# Patient Record
Sex: Female | Born: 1945
Health system: Southern US, Community
[De-identification: ages and names within clinical notes are randomized; demographics above are authoritative.]

## PROBLEM LIST (undated history)

## (undated) DIAGNOSIS — I7774 Dissection of vertebral artery: Secondary | ICD-10-CM

## (undated) DIAGNOSIS — N809 Endometriosis, unspecified: Secondary | ICD-10-CM

## (undated) DIAGNOSIS — S82891A Other fracture of right lower leg, initial encounter for closed fracture: Secondary | ICD-10-CM

## (undated) DIAGNOSIS — E782 Mixed hyperlipidemia: Secondary | ICD-10-CM

## (undated) DIAGNOSIS — I10 Essential (primary) hypertension: Secondary | ICD-10-CM

## (undated) DIAGNOSIS — Z8719 Personal history of other diseases of the digestive system: Secondary | ICD-10-CM

## (undated) DIAGNOSIS — M1990 Unspecified osteoarthritis, unspecified site: Secondary | ICD-10-CM

## (undated) DIAGNOSIS — Z8489 Family history of other specified conditions: Secondary | ICD-10-CM

## (undated) DIAGNOSIS — S93431S Sprain of tibiofibular ligament of right ankle, sequela: Secondary | ICD-10-CM

## (undated) DIAGNOSIS — R519 Headache, unspecified: Secondary | ICD-10-CM

## (undated) DIAGNOSIS — M545 Low back pain, unspecified: Secondary | ICD-10-CM

## (undated) DIAGNOSIS — R51 Headache: Secondary | ICD-10-CM

## (undated) DIAGNOSIS — I119 Hypertensive heart disease without heart failure: Secondary | ICD-10-CM

## (undated) DIAGNOSIS — G8929 Other chronic pain: Secondary | ICD-10-CM

## (undated) DIAGNOSIS — M47812 Spondylosis without myelopathy or radiculopathy, cervical region: Secondary | ICD-10-CM

## (undated) DIAGNOSIS — J45998 Other asthma: Secondary | ICD-10-CM

## (undated) DIAGNOSIS — R55 Syncope and collapse: Secondary | ICD-10-CM

## (undated) DIAGNOSIS — I951 Orthostatic hypotension: Secondary | ICD-10-CM

## (undated) DIAGNOSIS — N183 Chronic kidney disease, stage 3 unspecified: Secondary | ICD-10-CM

## (undated) DIAGNOSIS — I454 Nonspecific intraventricular block: Secondary | ICD-10-CM

## (undated) DIAGNOSIS — E119 Type 2 diabetes mellitus without complications: Secondary | ICD-10-CM

## (undated) DIAGNOSIS — I639 Cerebral infarction, unspecified: Secondary | ICD-10-CM

## (undated) DIAGNOSIS — I670 Dissection of cerebral arteries, nonruptured: Secondary | ICD-10-CM

## (undated) DIAGNOSIS — B029 Zoster without complications: Secondary | ICD-10-CM

## (undated) DIAGNOSIS — K219 Gastro-esophageal reflux disease without esophagitis: Secondary | ICD-10-CM

## (undated) DIAGNOSIS — M797 Fibromyalgia: Secondary | ICD-10-CM

## (undated) DIAGNOSIS — K259 Gastric ulcer, unspecified as acute or chronic, without hemorrhage or perforation: Secondary | ICD-10-CM

## (undated) DIAGNOSIS — E1142 Type 2 diabetes mellitus with diabetic polyneuropathy: Secondary | ICD-10-CM

## (undated) DIAGNOSIS — IMO0001 Reserved for inherently not codable concepts without codable children: Secondary | ICD-10-CM

## (undated) DIAGNOSIS — M722 Plantar fascial fibromatosis: Secondary | ICD-10-CM

## (undated) DIAGNOSIS — E669 Obesity, unspecified: Secondary | ICD-10-CM

## (undated) DIAGNOSIS — E7800 Pure hypercholesterolemia, unspecified: Secondary | ICD-10-CM

## (undated) DIAGNOSIS — N189 Chronic kidney disease, unspecified: Secondary | ICD-10-CM

## (undated) HISTORY — DX: Fibromyalgia: M79.7

## (undated) HISTORY — DX: Morbid (severe) obesity due to excess calories: E66.01

## (undated) HISTORY — PX: FRACTURE SURGERY: SHX138

## (undated) HISTORY — DX: Plantar fascial fibromatosis: M72.2

## (undated) HISTORY — PX: CLOSED REDUCTION NASAL FRACTURE: SHX5365

## (undated) HISTORY — PX: APPENDECTOMY: SHX54

## (undated) HISTORY — DX: Zoster without complications: B02.9

## (undated) HISTORY — DX: Type 2 diabetes mellitus with diabetic polyneuropathy: E11.42

## (undated) HISTORY — PX: TONSILLECTOMY: SUR1361

## (undated) HISTORY — DX: Headache: R51

## (undated) HISTORY — DX: Chronic kidney disease, unspecified: N18.9

## (undated) HISTORY — PX: LAPAROSCOPIC CHOLECYSTECTOMY: SUR755

## (undated) HISTORY — DX: Syncope and collapse: R55

## (undated) HISTORY — PX: JOINT REPLACEMENT: SHX530

## (undated) HISTORY — PX: ANAL FISSURE REPAIR: SHX2312

## (undated) HISTORY — PX: HAND SURGERY: SHX662

## (undated) HISTORY — DX: Dissection of vertebral artery: I77.74

## (undated) HISTORY — DX: Sprain of tibiofibular ligament of right ankle, sequela: S93.431S

## (undated) HISTORY — DX: Obesity, unspecified: E66.9

## (undated) HISTORY — DX: Reserved for inherently not codable concepts without codable children: IMO0001

## (undated) HISTORY — DX: Headache, unspecified: R51.9

## (undated) HISTORY — DX: Endometriosis, unspecified: N80.9

## (undated) HISTORY — DX: Mixed hyperlipidemia: E78.2

## (undated) HISTORY — DX: Essential (primary) hypertension: I10

## (undated) HISTORY — DX: Spondylosis without myelopathy or radiculopathy, cervical region: M47.812

## (undated) HISTORY — PX: CATARACT EXTRACTION W/ INTRAOCULAR LENS  IMPLANT, BILATERAL: SHX1307

## (undated) HISTORY — DX: Orthostatic hypotension: I95.1

## (undated) HISTORY — DX: Hypertensive heart disease without heart failure: I11.9

## (undated) HISTORY — DX: Other fracture of right lower leg, initial encounter for closed fracture: S82.891A

## (undated) HISTORY — DX: Nonspecific intraventricular block: I45.4

## (undated) HISTORY — DX: Unspecified osteoarthritis, unspecified site: M19.90

## (undated) HISTORY — PX: TOTAL KNEE ARTHROPLASTY: SHX125

## (undated) HISTORY — DX: Low back pain: M54.5

## (undated) HISTORY — DX: Other chronic pain: G89.29

## (undated) HISTORY — DX: Gastro-esophageal reflux disease without esophagitis: K21.9

---
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
## 1976-12-23 HISTORY — PX: TUBAL LIGATION: SHX77

## 1976-12-23 HISTORY — PX: SALPINGOOPHORECTOMY: SHX82

## 1977-12-23 HISTORY — PX: VAGINAL HYSTERECTOMY: SUR661

## 1992-12-23 HISTORY — PX: CHOLECYSTECTOMY: SHX55

## 1995-12-24 DIAGNOSIS — I670 Dissection of cerebral arteries, nonruptured: Secondary | ICD-10-CM | POA: Insufficient documentation

## 1995-12-24 HISTORY — DX: Dissection of cerebral arteries, nonruptured: I67.0

## 1999-06-01 ENCOUNTER — Encounter: Payer: Self-pay | Admitting: Anesthesiology

## 1999-06-01 ENCOUNTER — Encounter: Admission: RE | Admit: 1999-06-01 | Discharge: 1999-08-30 | Payer: Self-pay | Admitting: Anesthesiology

## 2001-02-20 ENCOUNTER — Encounter: Payer: Self-pay | Admitting: Pediatrics

## 2001-02-20 ENCOUNTER — Inpatient Hospital Stay (HOSPITAL_COMMUNITY): Admission: EM | Admit: 2001-02-20 | Discharge: 2001-02-22 | Payer: Self-pay | Admitting: Emergency Medicine

## 2001-02-21 ENCOUNTER — Encounter: Payer: Self-pay | Admitting: Pediatrics

## 2001-06-29 ENCOUNTER — Inpatient Hospital Stay (HOSPITAL_COMMUNITY): Admission: RE | Admit: 2001-06-29 | Discharge: 2001-07-03 | Payer: Self-pay | Admitting: Orthopedic Surgery

## 2006-06-16 ENCOUNTER — Ambulatory Visit (HOSPITAL_BASED_OUTPATIENT_CLINIC_OR_DEPARTMENT_OTHER): Admission: RE | Admit: 2006-06-16 | Discharge: 2006-06-16 | Payer: Self-pay | Admitting: Orthopedic Surgery

## 2007-06-10 ENCOUNTER — Inpatient Hospital Stay (HOSPITAL_COMMUNITY): Admission: RE | Admit: 2007-06-10 | Discharge: 2007-06-15 | Payer: Self-pay | Admitting: Orthopedic Surgery

## 2011-05-10 NOTE — Op Note (Signed)
NAMECHERE, BABSON               ACCOUNT NO.:  [id number]   MEDICAL RECORD NO.:  [id number]          PATIENT TYPE:  INP   LOCATION:  5009                         FACILITY:  [facility]   PHYSICIAN:  Feliberto Gottron. Turner Daniels, M.D.   DATE OF BIRTH:  December 23, 1946   DATE OF PROCEDURE:  06/17/2007  DATE OF DISCHARGE:  06/15/2007                               OPERATIVE REPORT   PREOPERATIVE DIAGNOSIS:  End-stage arthritis right knee.   POSTOPERATIVE DIAGNOSIS:  End-stage arthritis right knee.   PROCEDURE:  Right total knee arthroplasty using Osteonics Scorpio  components, 7 femur, 7 tibia, 5 modified medial patellar button and a 10  mm spacer.  All components cemented with a double batch of Howmedica  cement was 1500 mg Zinacef mixed in cement.   SURGEON:  Feliberto Gottron. Turner Daniels, M.D.   ASSISTANT:  Skip Mayer PA-C.   ANESTHETIC:  Endotracheal.   ESTIMATED BLOOD LOSS:  Minimal.   FLUID REPLACEMENT:  1500 mL crystalloid.   DRAINS:  Foley catheter and two medium hemostats.   TOURNIQUET TIME:  1 hour and 30 minutes.   INDICATIONS FOR PROCEDURE:  Chloe [name] underwent left total knee  arthroplasty by me about four or five years ago, has done very well with  the Osteonics Scorpio knee on the left side and now desires right total  knee arthroplasty.  She is 65 years old, has done well with a left knee  and desires exact same components on the right, has severe unremitting  pain that prevents chores and activities of daily living and is prepared  for surgical intervention.  All questions answered, and options  discussed.   DESCRIPTION OF PROCEDURE:  The patient was identified by armband, taken  the operating room at [facility] where the appropriate  anesthetic monitors were attached and right femoral nerve block  anesthesia was induced in the block area.  She was then taken to the  operating room.  The appropriate anesthetic monitors were reattached and  the right lower extremity prepped and  draped in usual sterile fashion  from the ankle to a tourniquet high on the right thigh.  The limb was  wrapped with an Esmarch bandage and tourniquet inflated to 350 mmHg.  Prior to this, the patient did receive a gram of antibiotics.  We began  the procedure by making an anterior midline incision starting one  handbreadth above the patella going over the patella and one handbreadth  distal to and 1 cm medial to the tibial tubercle.  Small bleeders in the  skin and subcutaneous tissue identified and cauterized.  The patellar  retinaculum was incised and reflected medially allowing a medial  parapatellar arthrotomy.  The patella was everted, the prepatellar fat  pad resected, the superficial medial collateral ligament was then  elevated off the proximal tibia leaving it intact distally to enhance  our exposure of the proximal tibia.  The patella was then everted and  the knee hyperflexed allowing removal of the anterior one half of the  menisci, the ACL and PCL and notch osteophytes and peripheral  osteophytes removed with osteotomes.  With the knee hyperflexed, we then  entered the proximal tibia with the Osteonics step drill followed by  intramedullary rod and pinned into place a 0 degrees posterior slope  cutting guide.  The proximal tibial cut was then accomplished without  difficulty removing about 6 mm of bone medially and about 8 mm of bone  laterally.  The posterior structures were protected.  Posterior  structures were protected with a posteromedial Z retractor, a McHale  retractor to the notch and a lateral Homan retractor while the proximal  tibia cut was accomplished.  We then directed our attention to the femur  and 2 mm anterior to the PCL origin entered the distal femur with a step  drill followed by a 5 degrees right cutting guide which was then pinned  into place along the epicondylar axis and allowed a 10 mm distal femoral  resection.  We then sized for #7 tibial  component, placed the pins in  neutral and hammered into place the chamfer cutting guide for the distal  femur.  In order we accomplished the anterior-posterior chamfer cuts  without difficulty, put in the Scorpio box cutting guide and performed  the Scorpio box cut again without difficulty.  The everted patella was  then grasped with a patellar cutting guide and the posterior 10 mm of  the patella resected and sized and drilled for a 5 modified medial  patellar button.  At this point with the knee hyperflexed, we directed  our attention back to the tibia, sized for 7 tibial baseplate, put the  Delta fit keel punch tower into place and performed the Delta fit keel  punches up to a seven tibial baseplate, having set the rotation with the  knee in extension so that the full line of the weight came over the  second metatarsal.  Satisfied with the preparation of the tibia,. all  bony surfaces were then water picked clean and dried with suction and  sponges.  A double batch of Howmedica Simplex cement with 1500 mg  Zinacef was then mixed, applied to all bony and metallic mating surfaces  after first performing reduction with trials and in order, we hammered  into place a #7 tibial baseplate, a #7 right femoral component, a 10 mm  spacer and a 5 modified medial patellar button was squeezed into place.  All excess cement was removed, the knee was once again reduced with the  Osteonics Scorpio spacer in place and the knee was noted to come to full  extension, flex to 130 degrees limited by adipose tissue.  After the  cement had cured, medium Hemovac drains were placed deep in the wound  which was once again water picked clean and then the parapatellar  arthrotomy closed with running #1 Vicryl suture.  The subcutaneous  tissue 0-0 and 2-0 undyed Vicryl suture and the skin with skin staples.  A dressing of Xeroform, 4x4 dressing sponges, Webril and Ace wrap applied.  The tourniquet was let down.   The patient was then awakened  and taken to recovery room without difficulty.      Feliberto Gottron. Turner Daniels, M.D.  Electronically Signed     FJR/MEDQ  D:  06/17/2007  T:  06/18/2007  Job:  [id number]

## 2011-05-10 NOTE — Discharge Summary (Signed)
[place]. [facility]  Patient:    Chloe [name], Chloe [name]                        MRN: [id number] Adm. Date:  [id number] Disc. Date: [id number] Attending:  Mick Sell CC:         Marlan Palau, M.D.   Discharge Summary  DATE OF BIRTH:  02-23-1946  ADMISSION DIAGNOSES: 1. Headache with nausea, rule out arterial dissection. 2. History of chronic left occipital pain syndrome. 3. History of vertebral artery dissection.  DISCHARGE DIAGNOSES: 1. Headache with nausea, rule out arterial dissection. 2. History of chronic left occipital pain syndrome. 3. History of vertebral artery dissection.  CONDITION ON DISCHARGE:  Stable.  DISCHARGE INSTRUCTIONS:  Diet:  Regular.  Activities:  Ad lib.  MEDICATIONS AT DISCHARGE: 1. Neurontin 800 mg q.a.m., 1200 mg q. noon, 600 mg q.p.m. and q.h.s. 2. Altace 10 mg q.d. 3. Premarin 0.625 mg q.d. 4. Maxzide 25 mg q.d. 5. Vicodin one to two q.4-6h. p.r.n. 6. Phenergan 25 mg q.6h. p.r.n.  LABORATORIES AND DIAGNOSTIC STUDIES:  Four-vessel cerebral angiogram performed February 21, 2001, revealing normal arteries and no evidence of dissection.  HISTORY OF PRESENT ILLNESS:  Please see Admission H&P for full admission details.  Briefly, this is a 65 year old woman with a history of a vertebral artery dissection who presented with explosive onset of headache, neck pain, and some nausea.  She has had a chronic pain syndrome on the left side of her head since her dissection, but felt like this was a different sort of pain. Because of her history of dissection and concerning symptoms, she was admitted for an arteriogram to rule out recurrent dissection.  HOSPITAL COURSE:  Arteriogram was performed on February 21, 2001, and was normal. The patients headache had gotten a little bit better over the course of the admission.  She did require a few doses of intravenous Demerol and Phenergan as well as oral Vicodin for control of her  headache.  On the morning of March 3, she felt a little better although she continued to have some headache and nausea.  She was discharged with symptomatic medications including Vicodin and Phenergan.  DISCHARGE FOLLOWUP:  She was advised to call [facility] at [phone] later this week to arrange appointment with her neurologist, Dr. Lesia Sago or with his physician assistant, Demetrio Lapping, P.A.-C. DD:  02/22/01 TD:  02/23/01 Job: [id number]

## 2011-05-10 NOTE — Discharge Summary (Signed)
[name]. [facility]  Patient:    Chloe [name], FEDORCHAK Visit Number: [id number] MRN: [id number]          Service Type: SUR Location: 5000 [id number] Attending Physician:  Alinda Deem Dictated by:   Dorthula Matas, P.A.-C. Adm. Date:  [id number] Disc. Date: [id number]                             Discharge Summary  PRIMARY DIAGNOSES:  End-stage degenerative joint disease of the left knee.  HISTORY OF PRESENT ILLNESS:  Patient is a 65 year old female who has been followed for end-stage arthritis for a number of years which began after a fall at work.  She failed conservative therapy including nonsteroidal anti-inflammatories, physical therapy, rest, and pain medication.  She now wishes to proceed with a total knee arthroplasty after discussing the risks versus benefits, especially after having a progressive valgus deformity with ______ femur.  ALLERGIES:  CODEINE, INDOCIN, PLASTIC TAPE.  MEDICATIONS: 1. Triamentere/hydrochlorothiazide 37.5/25 one p.o. b.i.d. 2. Premarin 0.625 mg one p.o. q.d. 3. Altace 10 mg one p.o. b.i.d. 4. Neurontin 400 mg two in the morning, three midday, two in the evening, and    two at night. 5. Bextra 20 mg one p.o. q.d. 6. Aspirin 81 mg q.d.  PAST MEDICAL HISTORY:  Usual childhood diseases.  Positive for hypertension, diverticulosis, and polyps.  SOCIAL HISTORY:  No tobacco.  No ethanol.  No IV drugs.  She is a Research officer, trade [employer] and is married but her husband is disabled and unable to provide much home care.  PAST SURGICAL HISTORY:  Vein stripping right leg 2000, left knee scope in 1980, hysterectomy 1980, gallbladder 1991.  No difficulties with ______.  FAMILY HISTORY:  Father died at age 12 with positive congestive heart failure, hypertension, diabetes.  Mother alive at age 15 with positive stroke, heart attack, diabetes, and CABG.  REVIEW OF SYSTEMS:  Positive for glasses.  No dentures.  Positive headaches. Denies  any shortness of breath or chest pain.  PHYSICAL EXAMINATION  VITAL SIGNS:  Patient is afebrile.  Pulse 70, respirations 16, blood pressure 122/88.  She is a 5 foot, 409 pounds female.  HEENT:  Normocephalic, atraumatic.  Eyes:  Pupils are equal, round and reactive to light and accommodation.  Ears:  TMs:  Clear.  Nose and throat are benign.  NECK:  Full range of motion without pain or crepitus.  CHEST:  Clear to auscultation and percussion.  HEART:  Regular rate and rhythm.  ABDOMEN:  Soft, nontender.  No masses.  Bowel sounds 2+.  EXTREMITIES:  Left knee range of motion 5-100 degrees with positive varus instability 1+ with a 20 degree valgus deformity and 1+ fusion.  She is otherwise neurovascularly intact.  Skin is warm and dry.  LABORATORIES:  Show bone on bone changes of the lateral compartment with changes also in the patellofemoral area.  Preoperative laboratories including CBC, CMET, chest x-ray, EKG, PTT, and PT were all within normal limits.  HOSPITAL COURSE:  The day of admission patient was taken to the operating room at [facility] where she underwent a left total knee arthroplasty using Osteonic Scorpio components #7 femur, #7 tibial base plate, 10 mm Scorpio spacer, #5 modified medial patellar button.  All components cemented using ______ cement with Zinacef mixed in.  Medium Hemovac drain double armed was placed.  She was placed on preoperative antibiotics for the first 48 hours.  She was placed on postoperative Coumadin prophylaxis with a target INR of 1.5-2.  She was placed on PCA morphine pump for pain control postoperatively.  Physical therapy was begun the day of surgery with CPM. Postoperative day #1 patient was awake and alert, moderate pain.  Vital signs were stable.  Dressing was dry.  Hemovac output 420 cc q.shift.  Hemoglobin 9.5.  She was otherwise stable and showed no signs or symptoms for postoperative anemia.  Second postoperative day the  patient was awake and alert, continued to improve.  PCA was discontinued.  She continued physical therapy and Coumadin.  Was making slow, but steady progress.  Hemoglobin was 8.0, hematocrit 23.1, WBC 4.6.  Still was asymptomatic in regards to her anemia.  Continue with physical therapy.  Postoperative day #4 the patient was awake and alert, modified pain, but doing well.  Range of motion 0-65 degrees. Wound was clean and dry.  PT 16.5.  She was otherwise medically stable and improved.  She was discharged home in the care of her family with continued home PT.  She will return to see PA in clinic in 7-12 days.  Prescriptions were given for Percocet and Coumadin as well as to restart home medications. She is weightbearing as tolerated, using crutches or walker for assistance. Diet is regular. DD:  08/10/01 TD:  08/10/01 Job: [id number]

## 2011-05-10 NOTE — Op Note (Signed)
[place]. [facility]  Patient:    Chloe [name], Chloe [name]                        MRN: [id number] Proc. Date: 06/29/01 Adm. Date:  [id number] Attending:  Alinda Deem                           Operative Report  PREOPERATIVE DIAGNOSIS:  End stage arthritis of left knee with valgus deformity.  POSTOPERATIVE DIAGNOSIS:  End stage arthritis of left knee with valgus deformity.  OPERATION PERFORMED:  Left total knee arthroplasty using Osteonics Scorpio components, a #7 left femur, a #7 tibial base plate, a 10 mm Scorpio spacer and a 35 modified medial patellar button.  All components cemented.  Palacos cement with Zinacef mixed in.  SURGEON:  Alinda Deem, M.D.  ASSISTANT: 1. Harvie Junior, M.D. 2. Dorthula Matas, P.A.-C.  ANESTHESIA:  General endotracheal.  ESTIMATED BLOOD LOSS:  Minimal.  FLUID REPLACEMENT:  1200 cc of crystalloid.  DRAINS:  Two medium Hemovacs.  TOURNIQUET TIME:  One hour and 20 minutes.  INDICATIONS FOR PROCEDURE:  The patient is a 65 year old woman who we have followed for end stage arthritis now for a number of years.  She has bone-on-bone arthritic changes of the lateral compartment and now desires total knee arthroplasty.  She is having a progressive valgus deformity with dishing out of the tibia and the lateral femur.  DESCRIPTION OF PROCEDURE:  The patient was identified by arm band and taken to the operating room at [facility] main hospital where the appropriate anesthetic monitors were attached and general endotracheal anesthesia induced with the patient in the supine position.  Tourniquet was applied high to the left thigh.  A lateral post was applied to the table and a foot positioner as well. The left lower extremity was prepped and draped in the usual sterile fashion from the ankle to the tourniquet.  The limb wrapped with an Esmarch bandage. Tourniquet inflated to 350 mmHg and a straight anterior midline incision  was made through the skin and subcutaneous tissue from one handbreadth above the patella to a handbreadth below the patella.  Medial parapatellar arthrotomy was then accomplished.  The patella was everted, prepatellar fat pad was removed as well as the anterior horns of the medial and lateral menisci.  A Michel retractor was placed posteriorly after removing the ACL and the PCL and a Hohmann retractor placed laterally exposing the proximal tibia with the knee hyperflexed allowing removal of the posterior horns of the menisci as well. Tibial spines were then removed with a power saw.  The proximal tibia was instrumented with the Osteonics step drill followed by an IM rod with a 0 degree posterior slope cutting guide allowing resection of about 6 mm of the tibial medially and 2 to 3 mm laterally where it was dished out.  Standard tibial cut was accomplished.  The femur was then entered distally with the Osteonics step drill, 2 mm anterior to the PCL origin and a 7 degree left cut was made in the femur with a 10 mm distal femoral cutting guide pinned in place.  We then sized for a #11 femoral component which was pinned into place in neutral rotation.  The anterior, posterior and chamfer cuts were then accomplished with the cutting guide hammered into place and clipped.  We then performed our Scorpio box cut without difficulty and a  trial 7 left femur component was applied to the distal femur as well as a 7 base plate with a 10 mm Scorpio trial spacer to the tibia.  Excellent stability was noted. Range of motion was 0 to almost 15 degrees limited by adipose tissue with no lift off and the trial components were then removed.  The everted patella was grasped with a patellar cutting guide and the posterior 10 mm of the patella resected with a power saw, sized to a #5 modified medial patellar button and drilled.  The knee was once again hyperflexed exposing the proximal tibia which was sized to the  7 base plate which was set in the correct rotation using the extramedullary rods.  The base plate was pinned in place and the delta fit keel punches were brought up to a 79 cemented punch.  All surfaces were then waterpicked clean and dried with suction and sponges.  A double batch of polymethyl methacrylate Palacos cement was then mixed with 1500 mg of Zinacef, applied to all bony and metallic surfaces, and in order, we hammered into place a #7 base plate tibia, a #7 left femoral component and a #5 modified medial patellar button.  Excess cement was removed.  The 10 mm spacer was then snapped into place on the tibial base plate while the cement was curing.  The knee was taken through a full range of motion and no thumb was required for patellar tracking.  The wound was then waterpicked clean after the cement had been cured and excess cement had been removed.  The parapatellar arthrotomy was closed with running #1 Vicryl suture.  The subcutaneous tissues with 0 and 2-0 undyed Vicryl sutures and the skin with skin staples.  A dressing of Xeroform, 4 x 4 dressing sponges, Webril and an Ace wrap applied.  The patient was awakened and taken to the recovery room without difficulty.  The patient was then awakened and taken to the recovery room without difficulty. DD:  06/29/01 TD:  06/29/01 Job: [id number]

## 2011-05-10 NOTE — Op Note (Signed)
[name], [name]               ACCOUNT NO.:  [id number]   MEDICAL RECORD NO.:  [id number]          PATIENT TYPE:  AMB   LOCATION:  DSC                          FACILITY:  [facility]   PHYSICIAN:  Feliberto Gottron. Turner Daniels, M.D.   DATE OF BIRTH:  Dec 09, 1946   DATE OF PROCEDURE:  06/16/2006  DATE OF DISCHARGE:                                 OPERATIVE REPORT   PREOPERATIVE DIAGNOSIS:  Right knee medial meniscal tear.   POSTOPERATIVE DIAGNOSIS:  Right knee chondromalacia medial femoral condyle,  grade 3; chondromalacia patella, grade 3; and lateral femoral condyle, grade  3.  She also had discoid lateral meniscus and only some minor degenerative  tearing of the posterior horn of the medial meniscus.   PROCEDURE:  Debridement of cartilage and meniscal tear.   SURGEON:  Feliberto Gottron. Turner Daniels, M.D.   ASSISTANT:  Skip Mayer, P.A.-C.   ANESTHETIC:  General LMA.   ESTIMATED BLOOD LOSS:  Minimal.   FLUID REPLACEMENT:  800 mL crystalloid.   We did use local anesthetic; about 10 mL of half percent Marcaine and  epinephrine solution was placed in the portal regions and into the knee  itself.   INDICATIONS FOR PROCEDURE:  A 66 year old woman with a left total knee and a  symptomatic right knee that has had medial joint line pain for almost a year  now.  She has failed conservative treatment with anti-inflammatory medicine,  exercise, attempts at weight loss, and still remains somewhat obese.  Risks  and benefits of surgery are discussed.  She is quite tender along the medial  joint line and has mechanical symptoms, and she has elected knee  arthroscopy.  Questions have been answered.   DESCRIPTION OF PROCEDURE:  The patient identified by armband, taken to the  operating room at [facility].  Appropriate anesthetic  monitors were attached, and general LMA anesthesia induced.  With the  patient in supine position, lateral posts applied to the table, and right  lower extremity prepped  and draped in usual sterile fashion from the ankle  to the mid thigh.  Using a #11 blade, standard inferomedial and  inferolateral peripatellar portals were made, allowing introduction of the  arthroscope through the inferolateral portal and the outflow through the  inferomedial portal.  Diagnostic arthroscopy revealed some mild  chondromalacia of the patella, grade 3, with flap tears, and this was  debrided back to stable margin, as was more extensive global chondromalacia  of the medial femoral condyle, grade 3, with flap tears.  This was debrided  as well.  Same with the lateral femoral condyle, grade 3, with flap tears.  She had a discoid lateral meniscus.  This was intact.  There was no evidence  of fraying and was left alone.  Getting back to the lateral femoral condyle,  there was an area of grade 3 with flap tears distal to the posterior about a  centimeter in length and 5 mm in width.  The menisci were actually intact on  the medial side, and again on the lateral side, she had a discoid lateral  meniscus.  The ACL and PCL were intact.  The  knee was irrigated out normal saline solution.  The arthroscopic instruments  were removed, and a dressing of Xeroform, 4x4 dressing, sponges, Webril and  Ace wrap applied.  The patient was awakened and taken to the recovery room  without difficulty.      Feliberto Gottron. Turner Daniels, M.D.  Electronically Signed     FJR/MEDQ  D:  06/16/2006  T:  06/16/2006  Job:  [id number]

## 2011-05-10 NOTE — Discharge Summary (Signed)
Chloe [name], Chloe [name]               ACCOUNT NO.:  [id number]   MEDICAL RECORD NO.:  [id number]          PATIENT TYPE:  INP   LOCATION:  5009                         FACILITY:  [facility]   PHYSICIAN:  Feliberto Gottron. Chloe [name], M.D.   DATE OF BIRTH:  1946/01/28   DATE OF ADMISSION:  06/10/2007  DATE OF DISCHARGE:  06/15/2007                               DISCHARGE SUMMARY   DIAGNOSIS:  End-stage degenerative joint disease of the right knee.   PROCEDURE:  Right total knee arthroplasty.   DISCHARGE SUMMARY:  The patient is a 65 year old woman who underwent  left total knee arthroplasty with Dr. Turner [name] 4 to 5 years prior to this  admission.  She has done well with an Osteonics Scorpio knee on the left  side, now desires a right total knee to match.  She is having  unremitting pain that prevents chores and activities of daily living and  she is prepared for surgical intervention and after all questions  answered and options discussed.   ALLERGIES:  SHE IS NOT ALLERGIC TO ANY MEDICATIONS.   MEDICATIONS:  At time of admission:  1. Neurontin.  2. Amitriptyline.  3. Toprol.  4. Diazide.  5. Altace.  6. Furosemide.  7. K-Dur.  8. Simvastatin.  9. Prilosec.  10.Ambien.  11.Aspirin.   PAST MEDICAL HISTORY:  1. Significant for usual childhood diseases.  2. Adult dissected right cervical vessel.  3. Hypertension.  4. Gastroesophageal reflux disease.  5. Fibromyalgia.  6. Diverticulosis.   PAST SURGICAL HISTORY:  1. Vein stripping right leg.  2. Left knee scope.  3. Hysterectomy and gallbladder, no difficulties.  4. Left total knee arthroplasty, no difficult.   FAMILY HISTORY:  Father died at age 90 with history of myocardial  infarction.  Mother is alive at age 41 with a history of CAD and  hypertension.   SOCIAL HISTORY:  The patient is married with an able-bodied husband.  She does not use tobacco, ethanol, or IV drugs.   REVIEW OF SYSTEMS:  She does read with assistance with her  glasses.  She  denies any shortness of breath, chest pain, or recent illness.   PHYSICAL EXAMINATION:  VITAL SIGNS:  At time of admission the patient's  temperature is 98.4, pulse 94, blood pressure 117/84, she is a 5 foot 4  inch, 200 pound female.  HEAD:  Normocephalic, atraumatic.  EARS:  Tympanic membranes clear.  EYES:  Pupils equal, round, reactive to light with accommodation.  NOSE:  Patent.  THROAT:  Benign.  NECK:  Supple with full range of motion.  CHEST:  Clear to auscultation and percussion.  HEART:  Regular rate and rhythm.  ABDOMEN:  Soft, nontender.  EXTREMITIES:  Right knee range of motion 100 degrees with crepitus and  trace effusion.  SKIN:  Within normal limits with a well-healed normal scar of a left  total knee.   LABORATORY DATA:  Including CBC, CMAT, chest x-ray, EKG, PT, and PTT  were within normal limits.   HOSPITAL COURSE:  On the day of admission the patient was taken to the  operating room  at [facility] where she underwent a right total knee  arthroplasty using Osteonics Scorpio components, #7 femur, #7 tibia, #5  modified medial patellar button, and a 10 mm spacer.  All components  cemented with a __________  batch of cement with 1500 mg Zinacef mixed  in.  Medium hemostat, double-arm, was placed into the knee and Foley  catheter was placed preoperatively.  She was placed on preoperative  antibiotics.  She was placed on postoperative Coumadin prophylaxis with  a target of 1.5 to 2 with bridging Lovenox therapy until she became  therapeutic.  Physical therapy was begun in the PACU using CPM.  She was  placed on a PCA Dilaudid pain pump for pain control.  Postoperative day  1, the patient was awake and alert with moderate pain control with PCA.  Taking sips without difficulty.  Vital signs were stable.  Urine output  1100, hemoglobin 10, WBC 9.1, PT 14.1.  Dressing was dry.  CPM in place  and tolerating well.  Postoperative day 2, the patient was  complaining  of pain, moderate to severe in the right knee.  No cough, no nausea or  vomiting.  Temperature max was 102.5 range 98.8, but no other changes in  vitals.  Hemoglobin 9.4, WBC had actually dropped to 7.7, INR 1.2.  Dressing was dry.  Wound edge was benign.  She was otherwise stable  orthopedically with temperature of unknown source.  Foley catheter was  discontinued after obtaining a urinalysis.  PCA was discontinued,  incentive spirometer was encouraged.  The patient continued with  physical therapy.  Postoperative day 3, the patient had no complaints.  She was afebrile.  Chest clear.  Wound was clean and dry, otherwise  neurovascularly intact and making steady progress with physical therapy.  She will continue with PT.  Postoperative day 4, the patient had no  complaints.  She was afebrile.  Vital signs were stable.  Chest remained  clear.  Wound remained clean and dry.  Hemoglobin 8.3, WBC 6.3, INR 1.7.  The patient had not yet met all physical therapy goals and was also held  for an additional day.  Postoperative day 5, the patient complained of  moderate pain.  She had a bowel movement.  T-max 101 range 98.4, vital  signs were stable.  Dressing was dry.  Wound was benign.  No signs of  infection.  Calf was soft, nontender. The patient was discharged home  after meeting physical therapy goals.  Incentive spirometry was  encouraged.   DISCHARGE MEDICATIONS:  1. Percocet.  2. Coumadin per pharmacy protocol.  3. Robaxin.  4. Other home meds were restarted with the exception of aspirin as per      home medications req. Sheet.   ACTIVITY:  Weightbearing as tolerated with total knee precaution.  The  patient should continue with CPM and other durable medical goods as per  home health agency.   FOLLOWUP:  The patient will return to clinic in approximately 7 weeks  time.  She will return sooner if she is having an increase in  temperature over 101, an increased drainage or  pain not well-controlled  with oral pain medications.  Dressing changes daily or as needed.  She  may shower postoperative day 6.  She may continue with regular diet and  again weightbearing as tolerated.      Laural Benes. Jannet Mantis.      Feliberto Gottron. Chloe [name], M.D.  Electronically Signed    JBR/MEDQ  D:  07/20/2007  T:  07/20/2007  Job:  [id number]

## 2011-05-10 NOTE — H&P (Signed)
[facility]  Patient:    Chloe [name], Chloe [name]                        MRN: [id number] Adm. Date:  [id number] Disc. Date: [id number] Attending:  Mick Sell                         History and Physical  CHIEF COMPLAINT: Headaches and neck pain.  HISTORY OF PRESENT ILLNESS: The patient had onset at 9 a.m. of severe pain in the neck with radiation to the vertex, similar to the pain she had in 1997 when she suffered a right vertebral dissection.  This was diagnosed with arteriogram but not MRI scan.  The patient made a good recovery. Unfortunately, she simultaneously developed shingles involving her right shoulder.  Since that time she has had severe occipital neuralgia that was helped only partly by high-grade Neurontin.  She was not able to tolerate carbatrol or Tegretol, and was not helped by local injection or Indocin. Subsequent MRIs of the brain and MR angiography have been normal.  PAST MEDICAL HISTORY:  1. Obesity.  2. Hypertension.  3. Postmenopausal state.  PAST SURGICAL HISTORY:  1. Cholecystectomy in 1992.  2. Hysterectomy in 1981.  REVIEW OF SYSTEMS: Positive for dizziness, true vertigo (this morning), widespread pressure/pain throughout her occiput radiating to the area behind her eyes, paresthesias in both arms circumferentially from the shoulder to the fingertips, slow mental processing and unsteady gait.  CURRENT MEDICATIONS:  1. Neurontin 400 mg 2 at 8 a.m., 3 at 12 noon, 2 at 4 p.m., 2 q.h.s.  2. Altace 10 mg q.a.m. and q.h.s.  3. Dyazide 25 mg 1 in the morning and at 4 p.m.  4. Premarin 0.625 mg 1 in the morning.  ALLERGIES: Intolerance to CODEINE.  No known allergies.  FAMILY HISTORY: Father died at the age of 64 with congestive heart failure and diabetes.  Mother is alive with atherosclerotic cardiovascular disease and diabetes.  Sister and brother have hypertension.  Another brother is alive and well.  Four children  are alive and well.  SOCIAL HISTORY: The patient is a Geologist, engineering.  She is married.  She has a high school education.  No tobacco or alcohol use.  PHYSICAL EXAMINATION:  VITAL SIGNS: Temperature 97 degrees, blood pressure 163/86 resting, pulse 74, respirations 18.  HEENT: Negative.  NECK: Tender to palpation.  She has exquisite tenderness over both cranial and cervical junction regions.  She has full range of motion of her neck without meningismus.  LUNGS: Clear.  HEART: No murmurs.  Pulses normal.  ABDOMEN: Soft.  Bowel sounds normal.  No hepatosplenomegaly.  EXTREMITIES: Large.  She has scars around her knees and arthritis.  No edema or cyanosis.  NEUROLOGIC: She is awake, thinking very slowly.  She is anxious and in moderate distress.  She has no dysphasia.  Cranial nerve examination shows round and reactive pupils (photophobia), normal fundi, visual fields full to double simultaneous stimuli; OKN responses equal.  Symmetric facial strength. Midline tongue and uvula.  Air conduction greater than bone conduction bilaterally.  Motor examination shows normal strength except for 4/5 in shoulders due to pain.  No drift.  Fine motor movements were strangely hesitant and tentative with touching; seemed functional to me.  Sensory examination shows mild stocking neuropathy, otherwise okay.  She has good vibration, proprioception, and stereognosis.  Cerebellar examination shows good finger-to-nose testing. The patient  had incoordination, especially with her eyes closed, but I would not call this truly dysmetric or dystaxic movement.  Gait was not tested. Deep tendon reflexes were diminished.  She had bilateral flexor and plantar responses.  IMPRESSION/PLAN: Headache, severe.  Neck pain without meningismus.  I doubt the patient has had a dissection but because of the similarity to her previous dissection in terms of symptomatology, we need to admit the patient and perform  CT scan of the brain without contrast to look for hemorrhage, check laboratories, place her on heparin, and perform arteriogram in the morning. She will be given Demerol and Phenergan for pain.  If you have any questions about this or I can be of assistance do not hesitate to contact me. DD:  02/20/01 TD:  02/23/01 Job: [id number]

## 2011-10-09 LAB — CBC
HCT: 27 — ABNORMAL LOW
HCT: 29.1 — ABNORMAL LOW
Hemoglobin: 10 — ABNORMAL LOW
Hemoglobin: 8.3 — ABNORMAL LOW
MCHC: 34.2
MCHC: 34.8
MCV: 87.6
MCV: 88.6
Platelets: 137 — ABNORMAL LOW
Platelets: 202
RBC: 2.61 — ABNORMAL LOW
RBC: 2.72 — ABNORMAL LOW
RBC: 3.09 — ABNORMAL LOW
RBC: 3.29 — ABNORMAL LOW
RDW: 12.8
WBC: 6.7
WBC: 9.1

## 2011-10-09 LAB — URINALYSIS, ROUTINE W REFLEX MICROSCOPIC
Bilirubin Urine: NEGATIVE
Glucose, UA: NEGATIVE
Specific Gravity, Urine: 1.02 (ref 1.005–1.035)
pH: 5.5

## 2011-10-09 LAB — PROTIME-INR
INR: 1.6 — ABNORMAL HIGH
INR: 1.7 — ABNORMAL HIGH
Prothrombin Time: 15.1
Prothrombin Time: 19.7 — ABNORMAL HIGH
Prothrombin Time: 20.4 — ABNORMAL HIGH
Prothrombin Time: 21.3 — ABNORMAL HIGH

## 2011-10-09 LAB — URINE MICROSCOPIC-ADD ON

## 2011-10-10 LAB — DIFFERENTIAL
Eosinophils Absolute: 0.2
Eosinophils Relative: 2
Lymphocytes Relative: 32
Lymphs Abs: 2.4
Monocytes Absolute: 0.7

## 2011-10-10 LAB — URINE MICROSCOPIC-ADD ON
RBC / HPF: NONE SEEN
WBC, UA: NONE SEEN

## 2011-10-10 LAB — URINALYSIS, ROUTINE W REFLEX MICROSCOPIC
Glucose, UA: NEGATIVE
Hgb urine dipstick: NEGATIVE
Specific Gravity, Urine: 1.008 (ref 1.005–1.035)
pH: 6.5

## 2011-10-10 LAB — CBC
MCHC: 34.4
MCV: 88.9
Platelets: 239
RBC: 4.25
WBC: 7.4

## 2011-10-10 LAB — ABO/RH: ABO/RH(D): O POS

## 2011-10-10 LAB — COMPREHENSIVE METABOLIC PANEL
ALT: 30
AST: 31
Albumin: 4
CO2: 27
Calcium: 9.5
Chloride: 99
Creatinine, Ser: 1.29 — ABNORMAL HIGH
GFR calc Af Amer: 51 — ABNORMAL LOW
GFR calc non Af Amer: 42 — ABNORMAL LOW
Sodium: 136

## 2011-10-10 LAB — TYPE AND SCREEN: ABO/RH(D): O POS

## 2013-08-14 ENCOUNTER — Encounter: Payer: Self-pay | Admitting: Nurse Practitioner

## 2013-08-16 ENCOUNTER — Ambulatory Visit (INDEPENDENT_AMBULATORY_CARE_PROVIDER_SITE_OTHER): Payer: Medicare Other | Admitting: Radiology

## 2013-08-16 ENCOUNTER — Telehealth: Payer: Self-pay | Admitting: Nurse Practitioner

## 2013-08-16 ENCOUNTER — Encounter: Payer: Self-pay | Admitting: Nurse Practitioner

## 2013-08-16 ENCOUNTER — Ambulatory Visit (INDEPENDENT_AMBULATORY_CARE_PROVIDER_SITE_OTHER): Payer: Medicare Other | Admitting: Nurse Practitioner

## 2013-08-16 VITALS — BP 122/51 | HR 75 | Ht 63.5 in | Wt 302.0 lb

## 2013-08-16 DIAGNOSIS — R519 Headache, unspecified: Secondary | ICD-10-CM | POA: Insufficient documentation

## 2013-08-16 DIAGNOSIS — IMO0001 Reserved for inherently not codable concepts without codable children: Secondary | ICD-10-CM | POA: Insufficient documentation

## 2013-08-16 DIAGNOSIS — R55 Syncope and collapse: Secondary | ICD-10-CM

## 2013-08-16 DIAGNOSIS — R51 Headache: Secondary | ICD-10-CM

## 2013-08-16 HISTORY — DX: Headache, unspecified: R51.9

## 2013-08-16 HISTORY — DX: Reserved for inherently not codable concepts without codable children: IMO0001

## 2013-08-16 HISTORY — DX: Syncope and collapse: R55

## 2013-08-16 MED ORDER — GABAPENTIN 400 MG PO CAPS: ORAL_CAPSULE | ORAL | Status: DC

## 2013-08-16 MED ORDER — AMITRIPTYLINE HCL 25 MG PO TABS: 50.0000 mg | ORAL_TABLET | Freq: Every day | ORAL | Status: DC

## 2013-08-16 MED ORDER — ZOLPIDEM TARTRATE 10 MG PO TABS: 10.0000 mg | ORAL_TABLET | Freq: Every evening | ORAL | Status: DC | PRN

## 2013-08-16 MED ORDER — DULOXETINE HCL 60 MG PO CPEP: 60.0000 mg | ORAL_CAPSULE | Freq: Two times a day (BID) | ORAL | Status: DC

## 2013-08-16 NOTE — Progress Notes (Signed)
Reason for visit follow up for headaches and fibromyalgia  HPI: Ms . Fager is a 67 year old right-handed white female with a history of fibromyalgia and obesity. The patient has been placed on Cymbalta, which has offered some benefit. The patient is on 120 mg daily, and she remains on amitriptyline as well, taking 50 mg at night. She is also on Gabapentin. The patient is sleeping better, and she feels better during the day. The pain level has not been eliminated, but she is able to tolerate the amount of pain that she has now. The patient walks with a cane.The patient is diabetic with most recent HbgA1C 9.0 on 07/28/13. She is now on sliding scale Humalog. She had an episode at church on 07/25/13 where she dosed off for a few minutes, had weakness and memory lapse. BS was 314 She was taken to the ER at [facility]. CT of the head with diffuse brain atrophy no acute intercranial hemorrhage mass effect or acute infarction.  ROS:  Weight gain, fatigue, ringing in the ears, blurred vision, shortness of breath, feeling, joint pain, allergies, headache, restless legs   Medications Current Outpatient Prescriptions on File Prior to Visit  Medication Sig Dispense Refill  . amitriptyline (ELAVIL) 25 MG tablet Take 25 mg by mouth. Take 2 at night      . aspirin 81 MG tablet Take 81 mg by mouth daily.      . Calcium-Magnesium-Vitamin D (CALCIUM 500 PO) Take 500 mg by mouth 2 (two) times daily.      . DULoxetine (CYMBALTA) 60 MG capsule Take 60 mg by mouth 2 (two) times daily.      . furosemide (LASIX) 40 MG tablet Take 40 mg by mouth 3 (three) times daily.       Marland Kitchen gabapentin (NEURONTIN) 400 MG capsule Take 400 mg by mouth. Take 2 caps in the morning, supper time and  Night . Take 3 caps at noon.      . insulin glargine (LANTUS) 100 UNIT/ML injection Inject 100 Units into the skin at bedtime.      . metoprolol succinate (TOPROL XL) 25 MG 24 hr tablet Take 25 mg by mouth 2 (two) times daily.      . Omega-3  Fatty Acids (FISH OIL) 1200 MG CAPS Take 1,200 mg by mouth 3 (three) times daily.      Marland Kitchen omeprazole (PRILOSEC) 20 MG capsule Take 20 mg by mouth 2 (two) times daily.       . ramipril (ALTACE) 10 MG capsule Take 10 mg by mouth 2 (two) times daily.      . simvastatin (ZOCOR) 20 MG tablet Take 20 mg by mouth daily.      Marland Kitchen spironolactone (ALDACTONE) 25 MG tablet Take 25 mg by mouth daily.      . vitamin E 400 UNIT capsule Take 400 Units by mouth daily.       No current facility-administered medications on file prior to visit.    Allergies No Known Allergies  Physical Exam General: well developed, morbidly obese female  seated, in no evident distress Head: head normocephalic and atraumatic. Oropharynx benign Neck: supple with no carotid  bruits Cardiovascular: regular rate and rhythm, no murmurs Skin:  1+ peripheral edema in lower extremities  Neurologic Exam Mental Status: Awake and fully alert. Oriented to place and time. Follows all commands. Speech and language normal.   Cranial Nerves: Pupils equal, briskly reactive to light. Extraocular movements full without nystagmus. Visual fields full to confrontation.  Hearing intact and symmetric to finger snap. Facial sensation intact. Face, tongue, palate move normally and symmetrically. Neck flexion and extension normal.  Motor: Normal bulk and tone. Normal strength in all tested extremity muscles. Sensory.: intact to touch and pinprick and vibratory.  Coordination: Rapid alternating movements normal in all extremities. Finger-to-nose performed accurately bilaterally. Gait and Station: Arises from chair without difficulty. Stance is wide based. Tandem gait is unsteady. No drift is seen   Reflexes: Diminished  and symmetric. Toes downgoing.     ASSESSMENT: History of headaches and fibromyalgia well controlled Episode of weakness and memory lapse at church, CT of the head diffuse brain atrophy nothing acute, blood sugar at the time  314.     PLAN: Discussed with Dr. Anne Hahn Will get EEG today Depending on those results probably need cardiology consult Continue current meds for headache and fibromyalgia   Nilda Riggs, GNP-BC APRN

## 2013-08-16 NOTE — Procedures (Signed)
  History:  Chloe [name] is a 67 year old patient with a history of obesity and fibromyalgia. The patient suffered a recent syncopal that while at church on 07/25/2013. The patient was found to have a blood sugar greater than 300 in the emergency room. CT the head was unremarkable. The patient is being evaluated for the syncopal event.  This is a routine EEG. No skull defects are noted. Medications include amitriptyline, aspirin, calcium, cinnamon, Lasix, Neurontin, insulin, metoprolol, niacin, Prilosec, Altace, Zocor, Aldactone, vitamin D, vitamin E, and Ambien.  EEG classification: Normal awake and drowsy  Description of the recording: The background rhythms of this recording consists of a fairly well modulated medium amplitude alpha rhythm of 8 Hz that is reactive to eye opening and closure. As the record progresses, the patient appears to remain in the waking state throughout the recording. Photic stimulation was performed, resulting in a bilateral and symmetric photic driving response. Hyperventilation was not performed. Toward the end of the recording, the patient enters the drowsy state with slight symmetric slowing seen. The patient never enters stage II sleep. At no time during the recording does there appear to be evidence of spike or spike wave discharges or evidence of focal slowing. EKG monitor shows no evidence of cardiac rhythm abnormalities with a heart rate of 60.  Impression: This is a normal EEG recording in the waking and drowsy state. No evidence of ictal or interictal discharges are seen.

## 2013-08-16 NOTE — Progress Notes (Signed)
I have read the note, and I agree with the clinical assessment and plan.  WILLIS,CHARLES KEITH   

## 2013-08-16 NOTE — Patient Instructions (Addendum)
Will schedule EEG Requested records from MD at [facility] along with recent ER records.  Continue meds as previously  F/U 3 months

## 2013-08-18 NOTE — Telephone Encounter (Signed)
Her exam was normal, she may have CTS which can evaluated at next visit with EMG/.

## 2013-08-18 NOTE — Telephone Encounter (Signed)
I called pt and relayed that on next appt, can assess for below.   She asked about her EEG and would dementia (ALZ) show up on this.   I told her that metabolic problems, medications, dementia can show slowing of brain waves.  Referred her to [facility] for information about Alzheimer's.

## 2013-11-16 ENCOUNTER — Encounter: Payer: Self-pay | Admitting: Nurse Practitioner

## 2013-11-16 ENCOUNTER — Ambulatory Visit (INDEPENDENT_AMBULATORY_CARE_PROVIDER_SITE_OTHER): Payer: Medicare Other | Admitting: Nurse Practitioner

## 2013-11-16 VITALS — BP 119/74 | HR 66 | Ht 64.0 in | Wt 303.0 lb

## 2013-11-16 DIAGNOSIS — IMO0001 Reserved for inherently not codable concepts without codable children: Secondary | ICD-10-CM

## 2013-11-16 DIAGNOSIS — R51 Headache: Secondary | ICD-10-CM

## 2013-11-16 DIAGNOSIS — R55 Syncope and collapse: Secondary | ICD-10-CM

## 2013-11-16 MED ORDER — DULOXETINE HCL 60 MG PO CPEP: 60.0000 mg | ORAL_CAPSULE | Freq: Two times a day (BID) | ORAL | Status: DC

## 2013-11-16 MED ORDER — GABAPENTIN 400 MG PO CAPS: ORAL_CAPSULE | ORAL | Status: DC

## 2013-11-16 NOTE — Progress Notes (Signed)
[facility]  PATIENT: Chloe [name] DOB: [birth year]   REASON FOR VISIT: Followup for fibromyalgia and headaches   HISTORY OF PRESENT ILLNESS: Chloe [name] is a 67 year old white female returns for followup. In addition to her fibromyalgia and headaches she had an episode of weakness and memory lapse at church on 07/25/2013. Blood sugar was found to be 314 in the emergency room, CT of the head with diffuse  brain atrophy but nothing acute. EEG in our office on 08/16/2013 was normal. Currently wearing a Holter monitor and is due to have a 2-D echo. She continues to complain of headaches on awakening. She has never been evaluated for sleep apnea. She tells me her primary care wants [place] to discontinue her amitriptyline.   HISTORY: right-handed white female with a history of fibromyalgia and obesity. The patient has been placed on Cymbalta, which has offered some benefit. The patient is on 120 mg daily, and she remains on amitriptyline as well, taking 50 mg at night. She is also on Gabapentin. The patient is sleeping better, and she feels better during the day. The pain level has not been eliminated, but she is able to tolerate the amount of pain that she has now. The patient walks with a cane.The patient is diabetic with most recent HbgA1C 9.0 on 07/28/13. She is now on sliding scale Humalog. She had an episode at church on 07/25/13 where she dosed off for a few minutes, had weakness and memory lapse. BS was 314 She was taken to the ER at [facility]. CT of the head with diffuse brain atrophy no acute intercranial hemorrhage mass effect or acute infarction.    REVIEW OF SYSTEMS: Full 14 system review of systems performed and notable only for:  Constitutional: Weight gain Cardiovascular: N/A  Ear/Nose/Throat: Ringing in the ears  Skin: N/A  Eyes: Blurred vision Respiratory: Shortness of breath, cough, wheezing, snoring Gastroitestinal: N/A  Hematology/Lymphatic: N/A    Endocrine: N/A Musculoskeletal: Joint pain, aching muscles Allergy/Immunology: Allergies  Neurological: Headache Psychiatric: N/A   ALLERGIES: No Known Allergies  HOME MEDICATIONS: Outpatient Prescriptions Prior to Visit  Medication Sig Dispense Refill  . amitriptyline (ELAVIL) 25 MG tablet Take 2 tablets (50 mg total) by mouth at bedtime. Take 2 at night  60 tablet  6  . aspirin 81 MG tablet Take 81 mg by mouth daily.      . Calcium-Magnesium-Vitamin D (CALCIUM 500 PO) Take 500 mg by mouth 2 (two) times daily.      . Cholecalciferol (VITAMIN D-3) 1000 UNITS CAPS Take 1,000 Units by mouth 3 (three) times daily.      . Cinnamon 500 MG TABS Take 500 mg by mouth 3 (three) times daily.      . DULoxetine (CYMBALTA) 60 MG capsule Take 1 capsule (60 mg total) by mouth 2 (two) times daily.  60 capsule  5  . furosemide (LASIX) 40 MG tablet Take 40 mg by mouth. 2 in the morning and 1 at 4:00      . gabapentin (NEURONTIN) 400 MG capsule Take 2 caps in the morning, supper time and  Night . Take 3 caps at noon.  270 capsule  6  . insulin glargine (LANTUS) 100 UNIT/ML injection Inject 100 Units into the skin at bedtime. 50 units in the am and 40 in the pm      . Insulin Lispro, Human, (HUMALOG [place]) Inject into the skin. 2-3 times daily      . metoprolol succinate (TOPROL  XL) 25 MG 24 hr tablet Take 25 mg by mouth 2 (two) times daily.      . niacin (SLO-NIACIN) 500 MG tablet Take 500 mg by mouth at bedtime.      . Omega-3 Fatty Acids (FISH OIL) 1200 MG CAPS Take 1,200 mg by mouth 3 (three) times daily.      Marland Kitchen omeprazole (PRILOSEC) 20 MG capsule Take 20 mg by mouth 2 (two) times daily.       . ramipril (ALTACE) 10 MG capsule Take 10 mg by mouth daily.       . simvastatin (ZOCOR) 20 MG tablet Take 20 mg by mouth daily.      Marland Kitchen spironolactone (ALDACTONE) 25 MG tablet Take 25 mg by mouth daily.      . TURMERIC PO Take by mouth 2 (two) times daily.      Marland Kitchen VITAMIN D, CHOLECALCIFEROL, PO Take 1,000 Units by  mouth 3 (three) times daily.      . vitamin E 400 UNIT capsule Take 400 Units by mouth daily.      Marland Kitchen zolpidem (AMBIEN) 10 MG tablet Take 1 tablet (10 mg total) by mouth at bedtime as needed for sleep.  30 tablet  5   No facility-administered medications prior to visit.    PAST MEDICAL HISTORY: Past Medical History  Diagnosis Date  . Fibromyalgia   . Obesity   . Occipital headache   . Vertebral artery dissection     right   . Shingles   . Hypertension   . Endometriosis   . Diabetes   . Chronic renal insufficiency   . Plantar fasciitis     PAST SURGICAL HISTORY: Past Surgical History  Procedure Laterality Date  . Gallbladder surgery    . Hysterotomy    . Appendectomy    . Tonsillectomy    . Total knee arthroplasty Bilateral     FAMILY HISTORY: Family History  Problem Relation Age of Onset  . Alzheimer's disease Mother   . Congestive Heart Failure Father   . Diabetes Sister   . Hypertension Brother     SOCIAL HISTORY: History   Social History  . Marital Status: Married    Spouse Name: Trey Paula    Number of Children: 4  . Years of Education: 13+   Occupational History  . Retired     Social History Main Topics  . Smoking status: Former Games developer  . Smokeless tobacco: Never Used  . Alcohol Use: No  . Drug Use: No  . Sexual Activity: Not on file   Other Topics Concern  . Not on file   Social History Narrative   Patient lives at home with her husband.    Patient is a retired Architectural technologist.    Patient has a high school education and some college.    Patient has four children.           PHYSICAL EXAM  Filed Vitals:   11/16/13 1015  BP: 119/74  Pulse: 66  Height: 5\' 4"  (1.626 m)  Weight: 303 lb (137.44 kg)   Body mass index is 51.98 kg/(m^2).  Generalized: Well developed, morbidly obese female in no acute distress  Head: normocephalic and atraumatic,. Oropharynx benign  Neck: Supple, no carotid bruits , neck size 17.5 inches Cardiac: Regular  rate rhythm, no murmur  Skin 1+ peripheral edema lower extremities  Neurological examination   Mentation: Alert oriented to time, place, history taking. Follows all commands speech and language fluent. ESS 1  Cranial nerve II-XII: Pupils were equal round reactive to light extraocular movements were full, visual field were full on confrontational test. Facial sensation and strength were normal. hearing was intact to finger rubbing bilaterally. Uvula tongue midline. head turning and shoulder shrug and were normal and symmetric.Tongue protrusion into cheek strength was normal. Motor: normal bulk and tone, full strength in the BUE, BLE, fine finger movements normal, no pronator drift. No focal weakness Sensory: normal and symmetric to light touch, pinprick, and  vibration  Coordination: finger-nose-finger, heel-to-shin bilaterally, no dysmetria Reflexes: Diminished and symmetric upper and lower Gait and Station: Rising up from seated position without assistance, wide based  stance,  moderate stride,  smooth turning, able to perform tiptoe, and heel walking without difficulty. Tandem gait mildly unsteady  DIAGNOSTIC DATA (LABS, IMAGING, TESTING) -None to review  ASSESSMENT AND PLAN  67 y.o. year old female  here to followup history of headaches fibromyalgia and episode of weakness in memory lapse at church back 07/25/2013. Blood sugar at that time 314. CT of the head with diffuse brain atrophy no acute intercranial hemorrhage mass effect or acute infarction. EEG in our office was normal. She is now having a cardiac workup. She says her primary care wants her to taper and discontinue amitriptyline. She does complain of morning headaches  Taper Amitriptyline by 1 cap for 2 weeks then discontinue Continue Cymbalta and gabapentin Will set up for sleep study after the holidays, BMI 51.98, neck 17.5 inches Nilda Riggs, [facility], APRN  [facility] [address] [place], [place] [address] [phone]

## 2013-11-16 NOTE — Progress Notes (Signed)
I have read the note, and I agree with the clinical assessment and plan.  WILLIS,CHARLES KEITH   

## 2013-11-16 NOTE — Patient Instructions (Signed)
Taper Amitriptyline by 1 cap for 2 weeks then discontinue Continue Cymbalta and gabapentin Will set up for sleep study after the holidays

## 2013-12-06 ENCOUNTER — Other Ambulatory Visit: Payer: Self-pay

## 2013-12-06 MED ORDER — ZOLPIDEM TARTRATE 10 MG PO TABS: 10.0000 mg | ORAL_TABLET | Freq: Every evening | ORAL | Status: DC | PRN

## 2013-12-06 NOTE — Telephone Encounter (Signed)
Rx faxed

## 2013-12-10 ENCOUNTER — Telehealth: Payer: Self-pay | Admitting: Neurology

## 2013-12-10 DIAGNOSIS — R0683 Snoring: Secondary | ICD-10-CM

## 2013-12-10 NOTE — Sleep Study (Signed)
After reviewing the sleep study referral, I entered a split night sleep study request on this patient, thanks. This patient is referred by Darrol Angel, nurse practitioner, and has a underlying history of diabetes, hyperlipidemia, hypertension, neuropathy, a chronic pain syndrome, as well as chronic kidney disease, morbid obesity, and complains of recurrent headaches, and snoring. Huston Foley, MD, PhD [facility])

## 2013-12-10 NOTE — Telephone Encounter (Signed)
Darrol Angel, NP is referring Chloe [name], 68 y.o. female, for an attended sleep study.  Wt: 303 lbs Ht: 64 in. BMI: 51.98  Diagnoses: Headache Morbid Obesity Syncope Fatigue Fibromyalgia HTN Diabetes Mellitus Type II Hyperlipidemia Peripheral Neuropathy  Medication List: Current Outpatient Prescriptions  Medication Sig Dispense Refill   amitriptyline (ELAVIL) 25 MG tablet Take 50 mg by mouth at bedtime. Taper to 1 cap for 2 weeks then discontinue       aspirin 81 MG tablet Take 81 mg by mouth daily.       Calcium-Magnesium-Vitamin D (CALCIUM 500 PO) Take 500 mg by mouth 2 (two) times daily.       Cholecalciferol (VITAMIN D-3) 1000 UNITS CAPS Take 1,000 Units by mouth 3 (three) times daily.       Cinnamon 500 MG TABS Take 500 mg by mouth 3 (three) times daily.       DULoxetine (CYMBALTA) 60 MG capsule Take 1 capsule (60 mg total) by mouth 2 (two) times daily.  60 capsule  6   furosemide (LASIX) 40 MG tablet Take 40 mg by mouth. 2 in the morning and 1 at 4:00       gabapentin (NEURONTIN) 400 MG capsule Take 2 caps in the morning, supper time and  Night . Take 3 caps at noon.  270 capsule  6   insulin glargine (LANTUS) 100 UNIT/ML injection Inject 100 Units into the skin at bedtime. 50 units in the am and 40 in the pm       Insulin Lispro, Human, (HUMALOG [place]) Inject into the skin. 2-3 times daily       metoprolol succinate (TOPROL XL) 25 MG 24 hr tablet Take 25 mg by mouth 2 (two) times daily.       niacin (SLO-NIACIN) 500 MG tablet Take 500 mg by mouth at bedtime.       Omega-3 Fatty Acids (FISH OIL) 1200 MG CAPS Take 1,200 mg by mouth 3 (three) times daily.       omeprazole (PRILOSEC) 20 MG capsule Take 20 mg by mouth 2 (two) times daily.        ramipril (ALTACE) 10 MG capsule Take 10 mg by mouth daily.        simvastatin (ZOCOR) 20 MG tablet Take 20 mg by mouth daily.       spironolactone (ALDACTONE) 25 MG tablet Take 25 mg by mouth daily.        TURMERIC PO Take by mouth 2 (two) times daily.       VITAMIN D, CHOLECALCIFEROL, PO Take 1,000 Units by mouth 3 (three) times daily.       vitamin E 400 UNIT capsule Take 400 Units by mouth daily.       zolpidem (AMBIEN) 10 MG tablet Take 1 tablet (10 mg total) by mouth at bedtime as needed for sleep.  30 tablet  5   No current facility-administered medications for this visit.    This patient presents to Darrol Angel, NP in follow up for headaches.  Pt has long history of headaches.  Reports having headache upon awakening.  She denies excessive daytime sleepiness but does take Ambien for insomnia.  Her neck size was noted to be 17.5.  Pt has a multitude of problems including diabetes, hyperlipidemia, hypertension, neuropathy, a chronic pain syndrome, as well as chronic kidney disease.  Her bmi is > 50 classifying her as super morbidly obese.  Darrol Angel, NP would like the pt to undergo an  attended sleep study to rule out osa.  Insurance:  [facility] Medicare - Prior approval is not required - 80% coverage

## 2013-12-10 NOTE — Telephone Encounter (Signed)
After reviewing the sleep study referral, I entered a split night sleep study request on this patient, thanks.  Inez Rosato, MD, PhD [facility] ([facility])    

## 2013-12-13 ENCOUNTER — Telehealth: Payer: Self-pay | Admitting: Nurse Practitioner

## 2013-12-13 NOTE — Telephone Encounter (Signed)
Called the patient to setup a sleep study and she explains that her heart doctor felt that her problem was due to standing too quickly and it causing her bp to drop, which led to her fainting.  She then says Dr. Anne Hahn did not mention a sleep evaluation as being necessary.  I tried to explain to the patient that there were other notable factors included in her medical history that was concerning for obstructive sleep apnea.  She said she was familiar with sleep apnea because her husband has it and she does not feel that any of it pertains to her.  Advised pt that I would notify the referring provider.

## 2013-12-14 NOTE — Telephone Encounter (Signed)
Noted above infromation

## 2014-05-20 ENCOUNTER — Ambulatory Visit: Payer: Medicare Other | Admitting: Neurology

## 2014-06-22 ENCOUNTER — Encounter: Payer: Self-pay | Admitting: Neurology

## 2014-06-22 ENCOUNTER — Ambulatory Visit (INDEPENDENT_AMBULATORY_CARE_PROVIDER_SITE_OTHER): Payer: Medicare Other | Admitting: Neurology

## 2014-06-22 VITALS — BP 133/70 | HR 70 | Wt 285.0 lb

## 2014-06-22 DIAGNOSIS — M47812 Spondylosis without myelopathy or radiculopathy, cervical region: Secondary | ICD-10-CM

## 2014-06-22 DIAGNOSIS — R55 Syncope and collapse: Secondary | ICD-10-CM

## 2014-06-22 DIAGNOSIS — IMO0001 Reserved for inherently not codable concepts without codable children: Secondary | ICD-10-CM

## 2014-06-22 DIAGNOSIS — R51 Headache: Secondary | ICD-10-CM

## 2014-06-22 NOTE — Patient Instructions (Signed)
Fibromyalgia Fibromyalgia is a disorder that is often misunderstood. It is associated with muscular pains and tenderness that comes and goes. It is often associated with fatigue and sleep disturbances. Though it tends to be long-lasting, fibromyalgia is not life-threatening. CAUSES  The exact cause of fibromyalgia is unknown. People with certain gene types are predisposed to developing fibromyalgia and other conditions. Certain factors can play a role as triggers, such as:  Spine disorders.  Arthritis.  Severe injury (trauma) and other physical stressors.  Emotional stressors. SYMPTOMS   The main symptom is pain and stiffness in the muscles and joints, which can vary over time.  Sleep and fatigue problems. Other related symptoms may include:  Bowel and bladder problems.  Headaches.  Visual problems.  Problems with odors and noises.  Depression or mood changes.  Painful periods (dysmenorrhea).  Dryness of the skin or eyes. DIAGNOSIS  There are no specific tests for diagnosing fibromyalgia. Patients can be diagnosed accurately from the specific symptoms they have. The diagnosis is made by determining that nothing else is causing the problems. TREATMENT  There is no cure. Management includes medicines and an active, healthy lifestyle. The goal is to enhance physical fitness, decrease pain, and improve sleep. HOME CARE INSTRUCTIONS   Only take over-the-counter or prescription medicines as directed by your caregiver. Sleeping pills, tranquilizers, and pain medicines may make your problems worse.  Low-impact aerobic exercise is very important and advised for treatment. At first, it may seem to make pain worse. Gradually increasing your tolerance will overcome this feeling.  Learning relaxation techniques and how to control stress will help you. Biofeedback, visual imagery, hypnosis, muscle relaxation, yoga, and meditation are all options.  Anti-inflammatory medicines and  physical therapy may provide short-term help.  Acupuncture or massage treatments may help.  Take muscle relaxant medicines as suggested by your caregiver.  Avoid stressful situations.  Plan a healthy lifestyle. This includes your diet, sleep, rest, exercise, and friends.  Find and practice a hobby you enjoy.  Join a fibromyalgia support group for interaction, ideas, and sharing advice. This may be helpful. SEEK MEDICAL CARE IF:  You are not having good results or improvement from your treatment. FOR MORE INFORMATION  National Fibromyalgia Association: www.fmaware.org Arthritis Foundation: www.arthritis.org Document Released: 12/09/2005 Document Revised: 03/02/2012 Document Reviewed: 03/21/2010 ExitCare Patient Information 2015 [employer]. This information is not intended to replace advice given to you by your health care provider. Make sure you discuss any questions you have with your health care provider.  

## 2014-06-22 NOTE — Progress Notes (Signed)
Reason for visit: Fibromyalgia  Chloe [name] is an 68 y.o. female  History of present illness:  Chloe [name] is a 68 year old right-handed white female with a history of obesity, fibromyalgia, and headaches. The patient has recently had an exacerbation in her neck and shoulder discomfort. The patient has had worsening pain over the last 2 months associated with some crepitus in the neck. The patient has pain into the neck, shoulders, and between the shoulder blades without radiation down the arms. She also reports some low back pain. She has been taken off of the amitriptyline, but she remains on Cymbalta. She is on gabapentin taking 800 mg in the morning, at supper, and at night and 1200 mg at noon. She does fairly well with this. She returns for an evaluation. She has been under some increased stress as her husband was hospitalized in March of 2015 with myelodysplasia.  Past Medical History  Diagnosis Date  . Fibromyalgia   . Obesity   . Occipital headache   . Vertebral artery dissection     right   . Shingles   . Hypertension   . Endometriosis   . Diabetes   . Chronic renal insufficiency   . Plantar fasciitis     Past Surgical History  Procedure Laterality Date  . Gallbladder surgery    . Hysterotomy    . Appendectomy    . Tonsillectomy    . Total knee arthroplasty Bilateral   . Cataract extraction      left 4/15 right 5/15    Family History  Problem Relation Age of Onset  . Alzheimer's disease Mother   . Congestive Heart Failure Father   . Diabetes Sister   . Hypertension Brother   . Diabetes Brother   . Diabetes Brother     Social history:  reports that she has quit smoking. She has never used smokeless tobacco. She reports that she does not drink alcohol or use illicit drugs.   No Known Allergies  Medications:  Current Outpatient Prescriptions on File Prior to Visit  Medication Sig Dispense Refill  . aspirin 81 MG tablet Take 81 mg by mouth daily.        . Calcium-Magnesium-Vitamin D (CALCIUM 500 PO) Take 500 mg by mouth 2 (two) times daily.      . Cholecalciferol (VITAMIN D-3) 1000 UNITS CAPS Take 1,000 Units by mouth 3 (three) times daily.      . Cinnamon 500 MG TABS Take 500 mg by mouth 3 (three) times daily.      . DULoxetine (CYMBALTA) 60 MG capsule Take 1 capsule (60 mg total) by mouth 2 (two) times daily.  60 capsule  6  . furosemide (LASIX) 40 MG tablet Take 40 mg by mouth. 2 in the morning and 1 at 4:00      . gabapentin (NEURONTIN) 400 MG capsule Take 2 caps in the morning, supper time and  Night . Take 3 caps at noon.  270 capsule  6  . insulin glargine (LANTUS) 100 UNIT/ML injection Inject 100 Units into the skin at bedtime. 50 units in the am and 40 in the pm      . Insulin Lispro, Human, (HUMALOG  [place]) Inject into the skin. 2-3 times daily      . metoprolol succinate (TOPROL XL) 25 MG 24 hr tablet Take 25 mg by mouth 2 (two) times daily.      . niacin (SLO-NIACIN) 500 MG tablet Take 500 mg by mouth at bedtime.      .Marland Kitchen  Omega-3 Fatty Acids (FISH OIL) 1200 MG CAPS Take 1,200 mg by mouth 3 (three) times daily.      Marland Kitchen. omeprazole (PRILOSEC) 20 MG capsule Take 20 mg by mouth 2 (two) times daily.       . ramipril (ALTACE) 10 MG capsule Take 10 mg by mouth daily.       . simvastatin (ZOCOR) 20 MG tablet Take 20 mg by mouth daily.      Marland Kitchen. spironolactone (ALDACTONE) 25 MG tablet Take 25 mg by mouth daily.      . TURMERIC PO Take by mouth 2 (two) times daily.      Marland Kitchen. VITAMIN D, CHOLECALCIFEROL, PO Take 1,000 Units by mouth 3 (three) times daily.      . vitamin E 400 UNIT capsule Take 400 Units by mouth daily.      Marland Kitchen. zolpidem (AMBIEN) 10 MG tablet Take 1 tablet (10 mg total) by mouth at bedtime as needed for sleep.  30 tablet  5   No current facility-administered medications on file prior to visit.    ROS:  Out of a complete 14 system review of symptoms, the patient complains only of the following symptoms, and all other reviewed systems are  negative.  Fatigue Ringing in the ears Light sensitivity Leg swelling Abdominal pain Eye allergies Joint pain, back pain, neck pain Moles Headache  Blood pressure 133/70, pulse 70, weight 285 lb (129.275 kg).  Physical Exam  General: The patient is alert and cooperative at the time of the examination. The patient is markedly obese.  Skin: No significant peripheral edema is noted.   Neurologic Exam  Mental status: The patient is oriented x 3.  Cranial nerves: Facial symmetry is present. Speech is normal, no aphasia or dysarthria is noted. Extraocular movements are full. Visual fields are full.  Motor: The patient has good strength in all 4 extremities.  Sensory examination: Soft touch sensation on the face, arms, and legs is symmetric.  Coordination: The patient has good finger-nose-finger and heel-to-shin bilaterally.  Gait and station: The patient has a normal gait. Tandem gait is slightly unsteady. Romberg is negative. No drift is seen.  Reflexes: Deep tendon reflexes are symmetric.   Assessment/Plan:  1. Fibromyalgia  2. Obesity  3. Headache  The patient will be sent for neuromuscular therapy on her neck and shoulders as this has exacerbated recently. The patient will remain on Cymbalta and gabapentin. The patient will followup in about 6 months  C. Lesia SagoKeith Lemond Griffee MD 06/22/2014 6:23 PM  [facility] [address] [place], [place] [id number]  Phone [phone] Fax [phone]

## 2014-06-29 ENCOUNTER — Other Ambulatory Visit: Payer: Self-pay | Admitting: Nurse Practitioner

## 2014-07-08 ENCOUNTER — Other Ambulatory Visit: Payer: Self-pay | Admitting: Neurology

## 2014-07-11 NOTE — Telephone Encounter (Signed)
Originally ordered 07/2013 at [facility]

## 2014-07-11 NOTE — Telephone Encounter (Signed)
Rx signed and faxed.

## 2014-09-12 ENCOUNTER — Other Ambulatory Visit: Payer: Self-pay | Admitting: Nurse Practitioner

## 2014-11-09 ENCOUNTER — Encounter: Payer: Self-pay | Admitting: Neurology

## 2014-11-11 ENCOUNTER — Other Ambulatory Visit: Payer: Self-pay | Admitting: Neurology

## 2014-11-15 ENCOUNTER — Encounter: Payer: Self-pay | Admitting: Neurology

## 2014-12-26 ENCOUNTER — Ambulatory Visit: Payer: Medicare Other | Admitting: Nurse Practitioner

## 2015-01-11 ENCOUNTER — Other Ambulatory Visit: Payer: Self-pay | Admitting: Neurology

## 2015-01-11 NOTE — Telephone Encounter (Signed)
Rx signed and faxed.

## 2015-01-16 ENCOUNTER — Ambulatory Visit: Payer: Medicare Other | Admitting: Nurse Practitioner

## 2015-02-02 ENCOUNTER — Encounter: Payer: Self-pay | Admitting: Neurology

## 2015-02-02 ENCOUNTER — Ambulatory Visit (INDEPENDENT_AMBULATORY_CARE_PROVIDER_SITE_OTHER): Payer: 59 | Admitting: Neurology

## 2015-02-02 VITALS — BP 137/78 | HR 61 | Ht 63.0 in | Wt 290.6 lb

## 2015-02-02 DIAGNOSIS — M791 Myalgia: Secondary | ICD-10-CM

## 2015-02-02 DIAGNOSIS — I951 Orthostatic hypotension: Secondary | ICD-10-CM | POA: Diagnosis not present

## 2015-02-02 DIAGNOSIS — G44221 Chronic tension-type headache, intractable: Secondary | ICD-10-CM

## 2015-02-02 DIAGNOSIS — M609 Myositis, unspecified: Secondary | ICD-10-CM | POA: Diagnosis not present

## 2015-02-02 DIAGNOSIS — IMO0001 Reserved for inherently not codable concepts without codable children: Secondary | ICD-10-CM

## 2015-02-02 HISTORY — DX: Orthostatic hypotension: I95.1

## 2015-02-02 NOTE — Progress Notes (Addendum)
Reason for visit: Fibromyalgia  Chloe [name] is an 69 y.o. female  History of present illness:  Chloe [name] is a 69 year old right-handed white female with a history of obesity, fibromyalgia, diabetes, and stage III chronic renal insufficiency. The patient continues to have ongoing fibromyalgia pain. She has ongoing left neck and shoulder discomfort. She will have pain in the left shoulder when she turns her head to the left. She is still under  stress with multiple medical illnesses of her husband. She recently had a syncopal event, and she was found to have significant orthostatic hypotension. The patient was cutback on her Altace, but she began to feel somewhat lightheaded and dizzy, and she went back on the full dose. She has noted that when she walks at times, she will veer to the right. She has not had any falls with this, she uses a cane for ambulation. She has some occasional headaches, coming up from the back of the head. She had a CT scan of the head and a CT of the cervical spine [facility] recently. She returns to this office for an evaluation. She remains on Cymbalta and gabapentin for her fibromyalgia.  Past Medical History  Diagnosis Date  . Fibromyalgia   . Obesity   . Occipital headache   . Vertebral artery dissection     right   . Shingles   . Hypertension   . Endometriosis   . Diabetes   . Chronic renal insufficiency   . Plantar fasciitis   . Orthostatic hypotension 02/02/2015    Past Surgical History  Procedure Laterality Date  . Gallbladder surgery    . Hysterotomy    . Appendectomy    . Tonsillectomy    . Total knee arthroplasty Bilateral   . Cataract extraction      left 4/15 right 5/15    Family History  Problem Relation Age of Onset  . Alzheimer's disease Mother   . Congestive Heart Failure Father   . Diabetes Sister   . Hypertension Brother   . Diabetes Brother   . Diabetes Brother     Social history:  reports that she has  quit smoking. She has never used smokeless tobacco. She reports that she does not drink alcohol or use illicit drugs.   No Known Allergies  Medications:  Current Outpatient Prescriptions on File Prior to Visit  Medication Sig Dispense Refill  . aspirin 81 MG tablet Take 81 mg by mouth daily.    . Calcium-Magnesium-Vitamin D (CALCIUM 500 PO) Take 500 mg by mouth 2 (two) times daily.    . Cholecalciferol (VITAMIN D-3) 1000 UNITS CAPS Take 1,000 Units by mouth 3 (three) times daily.    . Cinnamon 500 MG TABS Take 500 mg by mouth 2 (two) times daily.     . furosemide (LASIX) 40 MG tablet Take 40 mg by mouth. 2 in the morning and 1 at 4:00    . gabapentin (NEURONTIN) 400 MG capsule Take 2 caps in the morning, supper time and  Night . Take 3 caps at noon. 270 capsule 6  . insulin glargine (LANTUS) 100 UNIT/ML injection Inject 100 Units into the skin at bedtime. 40 units in the am and 20 in the pm    . Insulin Lispro, Human, (HUMALOG [place]) Inject into the skin. 2-3 times daily    . metoprolol succinate (TOPROL XL) 25 MG 24 hr tablet Take 25 mg by mouth 2 (two) times daily.    .Marland Kitchen  Omega-3 Fatty Acids (FISH OIL) 1200 MG CAPS Take 1,200 mg by mouth 3 (three) times daily.    Marland Kitchen omeprazole (PRILOSEC) 20 MG capsule Take 20 mg by mouth 2 (two) times daily.     . ramipril (ALTACE) 10 MG capsule Take 10 mg by mouth 2 (two) times daily. Two tabs daily    . TURMERIC PO Take by mouth 2 (two) times daily.    . vitamin E 400 UNIT capsule Take 400 Units by mouth daily.    Marland Kitchen zolpidem (AMBIEN) 10 MG tablet TAKE ONE TABLET BY MOUTH AT BEDTIME AS NEEDED FOR SLEEP 30 tablet 5   No current facility-administered medications on file prior to visit.    ROS:  Out of a complete 14 system review of symptoms, the patient complains only of the following symptoms, and all other reviewed systems are negative.  Hearing loss, ear pain, ringing in the ears Decreased activity level White sensitivity, blurred  vision Cough Abdominal pain Joint pain, back pain, neck pain, neck stiffness Dizziness, headache, syncope  Blood pressure 137/78, pulse 61, height  (1.6 m), weight 290 lb 9.6 oz (131.815 kg).   Blood pressure, right arm, sitting is 124/88. Blood pressure, standing, right arm is 94/70.  Physical Exam  General: The patient is alert and cooperative at the time of the examination. The patient is markedly obese.  Neuromuscular: The patient lacks about 15 of full lateral rotation of the cervical spine to the left, fairly normal range of movement to the right.  Skin: No significant peripheral edema is noted.   Neurologic Exam  Mental status: The patient is oriented x 3.  Cranial nerves: Facial symmetry is present. Speech is normal, no aphasia or dysarthria is noted. Extraocular movements are full. Visual fields are full.  Motor: The patient has good strength in all 4 extremities.  Sensory examination: Soft touch sensation is symmetric on the face, arms, and legs.  Coordination: The patient has good finger-nose-finger and heel-to-shin bilaterally.  Gait and station: The patient has a slightly wide-based gait. The patient walks with a cane with relatively good stability. Tandem gait was not attempted. Romberg is negative. No drift is seen.  Reflexes: Deep tendon reflexes are symmetric.   Assessment/Plan:  1. Fibromyalgia  2. Obesity  3. Left neck and shoulder discomfort  4. Orthostatic hypotension  The patient never underwent neuromuscular therapy that was ordered in July 2015 secondary to the fact that her husband has been quite ill, she has been focusing her attention to him. When the patient does have time to enter into physical therapy, we can do this in the future. She will remain on the Cymbalta and gabapentin, I will try to get the report of the cervical CT done at [facility]. The patient could potentially gain benefit from epidural steroid injections in the  future. The patient will follow-up otherwise in 6 months.  Marlan Palau MD 08/10/2015 11:00 AM  [facility] [address] [place], [place] [address]  Phone [phone] Fax [phone]

## 2015-02-02 NOTE — Patient Instructions (Signed)
Fibromyalgia Fibromyalgia is a disorder that is often misunderstood. It is associated with muscular pains and tenderness that comes and goes. It is often associated with fatigue and sleep disturbances. Though it tends to be long-lasting, fibromyalgia is not life-threatening. CAUSES  The exact cause of fibromyalgia is unknown. People with certain gene types are predisposed to developing fibromyalgia and other conditions. Certain factors can play a role as triggers, such as:  Spine disorders.  Arthritis.  Severe injury (trauma) and other physical stressors.  Emotional stressors. SYMPTOMS   The main symptom is pain and stiffness in the muscles and joints, which can vary over time.  Sleep and fatigue problems. Other related symptoms may include:  Bowel and bladder problems.  Headaches.  Visual problems.  Problems with odors and noises.  Depression or mood changes.  Painful periods (dysmenorrhea).  Dryness of the skin or eyes. DIAGNOSIS  There are no specific tests for diagnosing fibromyalgia. Patients can be diagnosed accurately from the specific symptoms they have. The diagnosis is made by determining that nothing else is causing the problems. TREATMENT  There is no cure. Management includes medicines and an active, healthy lifestyle. The goal is to enhance physical fitness, decrease pain, and improve sleep. HOME CARE INSTRUCTIONS   Only take over-the-counter or prescription medicines as directed by your caregiver. Sleeping pills, tranquilizers, and pain medicines may make your problems worse.  Low-impact aerobic exercise is very important and advised for treatment. At first, it may seem to make pain worse. Gradually increasing your tolerance will overcome this feeling.  Learning relaxation techniques and how to control stress will help you. Biofeedback, visual imagery, hypnosis, muscle relaxation, yoga, and meditation are all options.  Anti-inflammatory medicines and  physical therapy may provide short-term help.  Acupuncture or massage treatments may help.  Take muscle relaxant medicines as suggested by your caregiver.  Avoid stressful situations.  Plan a healthy lifestyle. This includes your diet, sleep, rest, exercise, and friends.  Find and practice a hobby you enjoy.  Join a fibromyalgia support group for interaction, ideas, and sharing advice. This may be helpful. SEEK MEDICAL CARE IF:  You are not having good results or improvement from your treatment. FOR MORE INFORMATION  National Fibromyalgia Association: www.fmaware.org Arthritis Foundation: www.arthritis.org Document Released: 12/09/2005 Document Revised: 03/02/2012 Document Reviewed: 03/21/2010 ExitCare Patient Information 2015 [employer]. This information is not intended to replace advice given to you by your health care provider. Make sure you discuss any questions you have with your health care provider.  

## 2015-02-03 ENCOUNTER — Telehealth: Payer: Self-pay | Admitting: Neurology

## 2015-02-03 NOTE — Telephone Encounter (Signed)
-----   Message from York Spanielharles K Willis, MD sent at 02/02/2015 10:13 PM EST ----- Please call [facility] and get the CT brain and cervical report faxed to our office. Thanks

## 2015-02-03 NOTE — Telephone Encounter (Signed)
Chloe [name] can you please get the CT brain and cervical report from [facility] and give to Dr. Anne HahnWillis.  Thank you.

## 2015-02-03 NOTE — Telephone Encounter (Signed)
Received records from [facility] Brain and cervical 02/03/15.

## 2015-02-03 NOTE — Telephone Encounter (Signed)
I received a report of the CT of the spine done at [facility]. She also had a CT the head. This shows diffuse cortical atrophy with mild chronic ischemic white matter disease. CT of the cervical spine showed mild degenerative disc disease at the C5-6 and C6-7 levels, without acute abnormalities.  The patient also had x-rays of the right knee, right elbow. Elbow x-ray showed no acute abnormalities. X-ray the thoracic spine was done, and was normal.

## 2015-03-19 ENCOUNTER — Other Ambulatory Visit: Payer: Self-pay

## 2015-03-19 MED ORDER — DULOXETINE HCL 60 MG PO CPEP: 60.0000 mg | ORAL_CAPSULE | Freq: Two times a day (BID) | ORAL | Status: DC

## 2015-07-24 ENCOUNTER — Other Ambulatory Visit: Payer: Self-pay | Admitting: Neurology

## 2015-08-10 ENCOUNTER — Ambulatory Visit (INDEPENDENT_AMBULATORY_CARE_PROVIDER_SITE_OTHER): Payer: Medicare Other | Admitting: Neurology

## 2015-08-10 ENCOUNTER — Encounter: Payer: Self-pay | Admitting: Neurology

## 2015-08-10 VITALS — BP 124/64 | HR 62 | Ht 63.0 in | Wt 278.6 lb

## 2015-08-10 DIAGNOSIS — M47812 Spondylosis without myelopathy or radiculopathy, cervical region: Secondary | ICD-10-CM | POA: Diagnosis not present

## 2015-08-10 DIAGNOSIS — M791 Myalgia: Secondary | ICD-10-CM | POA: Diagnosis not present

## 2015-08-10 DIAGNOSIS — G44221 Chronic tension-type headache, intractable: Secondary | ICD-10-CM | POA: Diagnosis not present

## 2015-08-10 DIAGNOSIS — IMO0001 Reserved for inherently not codable concepts without codable children: Secondary | ICD-10-CM

## 2015-08-10 DIAGNOSIS — M609 Myositis, unspecified: Secondary | ICD-10-CM

## 2015-08-10 HISTORY — DX: Spondylosis without myelopathy or radiculopathy, cervical region: M47.812

## 2015-08-10 NOTE — Progress Notes (Signed)
Reason for visit: Fibromyalgia  Chloe [name] is an 69 y.o. female  History of present illness:  Chloe [name] is a 69 year old right-handed white female with a history of obesity, cervical spondylosis, and fibromyalgia. The patient recently lost her husband in March 2016. She was unable to engage in physical therapy before that as she was caring for her husband. The patient is on maximum doses of Cymbalta and gabapentin. She still has a lot of neuromuscular discomfort, pain in the neck and shoulders. She has some mild gait instability, she uses a cane for ambulation, but she still will fall on occasion. She has come off of her cholesterol medication and her Niaspan secondary to side effects. She denies any other significant new medical issues that have come up since last seen.  Past Medical History  Diagnosis Date  . Fibromyalgia   . Obesity   . Occipital headache   . Vertebral artery dissection     right   . Shingles   . Hypertension   . Endometriosis   . Diabetes   . Chronic renal insufficiency   . Plantar fasciitis   . Orthostatic hypotension 02/02/2015  . Cervical spondylosis without myelopathy 08/10/2015    Past Surgical History  Procedure Laterality Date  . Gallbladder surgery    . Hysterotomy    . Appendectomy    . Tonsillectomy    . Total knee arthroplasty Bilateral   . Cataract extraction      left 4/15 right 5/15    Family History  Problem Relation Age of Onset  . Alzheimer's disease Mother   . Congestive Heart Failure Father   . Diabetes Sister   . Hypertension Brother   . Diabetes Brother   . Diabetes Brother     Social history:  reports that she has quit smoking. She has never used smokeless tobacco. She reports that she does not drink alcohol or use illicit drugs.   No Known Allergies  Medications:  Prior to Admission medications   Medication Sig Start Date End Date Taking? Authorizing Provider  aspirin 81 MG tablet Take 81 mg by mouth daily.    Yes Historical Provider, MD  BIOTIN PO Take by mouth.   Yes Historical Provider, MD  Calcium-Magnesium-Vitamin D (CALCIUM 500 PO) Take 500 mg by mouth 2 (two) times daily.   Yes Historical Provider, MD  Cholecalciferol (VITAMIN D-3) 1000 UNITS CAPS Take 1,000 Units by mouth 3 (three) times daily.   Yes Historical Provider, MD  Cinnamon 500 MG TABS Take 500 mg by mouth 2 (two) times daily.    Yes Historical Provider, MD  COCONUT OIL PO Take 2 capsules by mouth daily.   Yes Historical Provider, MD  dicyclomine (BENTYL) 20 MG tablet Take 20 mg by mouth 3 (three) times daily before meals.   Yes Historical Provider, MD  DULoxetine (CYMBALTA) 60 MG capsule Take 1 capsule (60 mg total) by mouth 2 (two) times daily. 03/19/15  Yes York Spaniel, MD  FIBER PO Take 2 tablets by mouth daily.   Yes Historical Provider, MD  furosemide (LASIX) 40 MG tablet Take 40 mg by mouth. 2 in the morning and 1 at 4:00   Yes Historical Provider, MD  gabapentin (NEURONTIN) 400 MG capsule Take 2 caps in the morning, supper time and  Night . Take 3 caps at noon. 11/16/13  Yes Nilda Riggs, NP  insulin glargine (LANTUS) 100 UNIT/ML injection Inject 100 Units into the skin at bedtime. 40 units  in the am and 20 in the pm   Yes Historical Provider, MD  Insulin Lispro, Human, (HUMALOG [place]) Inject into the skin. 2-3 times daily   Yes Historical Provider, MD  metoprolol succinate (TOPROL XL) 25 MG 24 hr tablet Take 25 mg by mouth 2 (two) times daily.   Yes Historical Provider, MD  Omega-3 Fatty Acids (FISH OIL) 1200 MG CAPS Take 1,200 mg by mouth 3 (three) times daily.   Yes Historical Provider, MD  omeprazole (PRILOSEC) 20 MG capsule Take 20 mg by mouth 2 (two) times daily.    Yes Historical Provider, MD  ramipril (ALTACE) 10 MG capsule Take 10 mg by mouth 2 (two) times daily. Two tabs daily   Yes Historical Provider, MD  traMADol (ULTRAM) 50 MG tablet Take 50 mg by mouth every 6 (six) hours as needed.   Yes Historical  Provider, MD  TURMERIC PO Take by mouth 2 (two) times daily.   Yes Historical Provider, MD  UNABLE TO FIND Take 1 tablet by mouth daily. Med Name: stool softner   Yes Historical Provider, MD  vitamin E 400 UNIT capsule Take 400 Units by mouth daily.   Yes Historical Provider, MD  zolpidem (AMBIEN) 10 MG tablet TAKE ONE TABLET BY MOUTH AT BEDTIME AS NEEDED FOR SLEEP 01/11/15  Yes York Spaniel, MD    ROS:  Out of a complete 14 system review of symptoms, the patient complains only of the following symptoms, and all other reviewed systems are negative.  Excessive sweating Ringing in the ears Light sensitivity, blurred vision Leg swelling Abdominal pain Joint pain, joint swelling, back pain, neck pain, neck stiffness Dizziness, headache  Blood pressure 124/64, pulse 62, height 5\' 3"  (1.6 m), weight 278 lb 9.6 oz (126.372 kg).  Physical Exam  General: The patient is alert and cooperative at the time of the examination. The patient is markedly obese.  Skin: No significant peripheral edema is noted.   Neurologic Exam  Mental status: The patient is alert and oriented x 3 at the time of the examination. The patient has apparent normal recent and remote memory, with an apparently normal attention span and concentration ability.   Cranial nerves: Facial symmetry is present. Speech is normal, no aphasia or dysarthria is noted. Extraocular movements are full. Visual fields are full.  Motor: The patient has good strength in all 4 extremities.  Sensory examination: Soft touch sensation is symmetric on the face, arms, and legs.  Coordination: The patient has good finger-nose-finger and heel-to-shin bilaterally.  Gait and station: The patient has a normal gait, the patient uses a cane for ambulation.. Tandem gait is slightly unsteady. Romberg is negative. No drift is seen.  Reflexes: Deep tendon reflexes are symmetric.   Assessment/Plan:  1. Fibromyalgia  2. Morbid obesity  3.  Mild gait disturbance  4. Cervical spondylosis  The patient will be sent for physical therapy for neuromuscular therapy on the neck and shoulders. If this is not effective, she will contact our office, we will consider epidural steroid injections on the neck. She will continue the gabapentin and Cymbalta for now. She will follow-up in 2 months.  Marlan Palau MD 08/10/2015 9:01 PM  [facility] [address] [place], [place] [address]  Phone [phone] Fax [phone]

## 2015-08-10 NOTE — Patient Instructions (Addendum)
We will set you up for physical therapy on the neck and shoulders.   Fibromyalgia Fibromyalgia is a disorder that is often misunderstood. It is associated with muscular pains and tenderness that comes and goes. It is often associated with fatigue and sleep disturbances. Though it tends to be long-lasting, fibromyalgia is not life-threatening. CAUSES  The exact cause of fibromyalgia is unknown. People with certain gene types are predisposed to developing fibromyalgia and other conditions. Certain factors can play a role as triggers, such as:  Spine disorders.  Arthritis.  Severe injury (trauma) and other physical stressors.  Emotional stressors. SYMPTOMS   The main symptom is pain and stiffness in the muscles and joints, which can vary over time.  Sleep and fatigue problems. Other related symptoms may include:  Bowel and bladder problems.  Headaches.  Visual problems.  Problems with odors and noises.  Depression or mood changes.  Painful periods (dysmenorrhea).  Dryness of the skin or eyes. DIAGNOSIS  There are no specific tests for diagnosing fibromyalgia. Patients can be diagnosed accurately from the specific symptoms they have. The diagnosis is made by determining that nothing else is causing the problems. TREATMENT  There is no cure. Management includes medicines and an active, healthy lifestyle. The goal is to enhance physical fitness, decrease pain, and improve sleep. HOME CARE INSTRUCTIONS   Only take over-the-counter or prescription medicines as directed by your caregiver. Sleeping pills, tranquilizers, and pain medicines may make your problems worse.  Low-impact aerobic exercise is very important and advised for treatment. At first, it may seem to make pain worse. Gradually increasing your tolerance will overcome this feeling.  Learning relaxation techniques and how to control stress will help you. Biofeedback, visual imagery, hypnosis, muscle relaxation, yoga, and  meditation are all options.  Anti-inflammatory medicines and physical therapy may provide short-term help.  Acupuncture or massage treatments may help.  Take muscle relaxant medicines as suggested by your caregiver.  Avoid stressful situations.  Plan a healthy lifestyle. This includes your diet, sleep, rest, exercise, and friends.  Find and practice a hobby you enjoy.  Join a fibromyalgia support group for interaction, ideas, and sharing advice. This may be helpful. SEEK MEDICAL CARE IF:  You are not having good results or improvement from your treatment. FOR MORE INFORMATION  National Fibromyalgia Association: www.fmaware.org Arthritis Foundation: www.arthritis.org Document Released: 12/09/2005 Document Revised: 03/02/2012 Document Reviewed: 03/21/2010 [facility] Patient Information 2015 [place], [place]. This information is not intended to replace advice given to you by your health care provider. Make sure you discuss any questions you have with your health care provider.

## 2015-10-29 ENCOUNTER — Other Ambulatory Visit: Payer: Self-pay | Admitting: Neurology

## 2015-12-02 ENCOUNTER — Other Ambulatory Visit: Payer: Self-pay | Admitting: Neurology

## 2016-01-04 ENCOUNTER — Other Ambulatory Visit: Payer: Self-pay | Admitting: Neurology

## 2016-01-15 ENCOUNTER — Telehealth: Payer: Self-pay | Admitting: Neurology

## 2016-01-15 NOTE — Telephone Encounter (Signed)
Ins has been contacted and provided with clinical info.  Request is currently under review.  Ref # [id number] Please note, ins has patient listed as Chloe [name]   [facility]) has approved the request for coverage on Zolpidem effective until 12/22/2016 Ref # [id number]

## 2016-01-15 NOTE — Telephone Encounter (Signed)
Pt called said she rec'd letter from [facility] sts  zolpidem (AMBIEN) 10 MG tablet needs PA. Fax # [phone]  Phone # [phone]

## 2016-02-16 ENCOUNTER — Ambulatory Visit (INDEPENDENT_AMBULATORY_CARE_PROVIDER_SITE_OTHER): Payer: 59 | Admitting: Neurology

## 2016-02-16 ENCOUNTER — Encounter: Payer: Self-pay | Admitting: Neurology

## 2016-02-16 VITALS — BP 121/77 | HR 62 | Ht 63.5 in | Wt 262.0 lb

## 2016-02-16 DIAGNOSIS — M47812 Spondylosis without myelopathy or radiculopathy, cervical region: Secondary | ICD-10-CM

## 2016-02-16 DIAGNOSIS — G44221 Chronic tension-type headache, intractable: Secondary | ICD-10-CM | POA: Diagnosis not present

## 2016-02-16 DIAGNOSIS — M609 Myositis, unspecified: Secondary | ICD-10-CM

## 2016-02-16 DIAGNOSIS — M791 Myalgia: Secondary | ICD-10-CM

## 2016-02-16 DIAGNOSIS — IMO0001 Reserved for inherently not codable concepts without codable children: Secondary | ICD-10-CM

## 2016-02-16 MED ORDER — GABAPENTIN 400 MG PO CAPS: ORAL_CAPSULE | ORAL | Status: DC

## 2016-02-16 MED ORDER — DULOXETINE HCL 60 MG PO CPEP: 60.0000 mg | ORAL_CAPSULE | Freq: Two times a day (BID) | ORAL | Status: DC

## 2016-02-16 NOTE — Patient Instructions (Signed)

## 2016-02-16 NOTE — Progress Notes (Signed)
Reason for visit:  Fibromyalgia  Chloe [name] is an 70 y.o. female  History of present illness:   Ms. Chloe [name] is a 70 year old right-handed white female with a history of fibromyalgia, cervical and lumbar spondylosis. The patient has chronic neck and low back pain. The patient is on gabapentin and Cymbalta. She was sent for physical therapy for her neck and shoulders which seemed to help while she was getting therapy, but did not have a long lasting effect. The patient has been trying to lose weight, she indicates that she has lost 30 pounds, but she remains significantly overweight. She walks with a cane, she had a fall on 11/19/2015 fracturing her right wrist, and she had some trauma around the right brow. The patient is having some chronic issues with insomnia, she takes Ambien for this. She returns to the office today for an evaluation. She has some primarily left-sided neck pain and stiffness in the neck. She is limited with how far she can walk secondary to her back and hip discomfort. She has a TENS unit which she uses on the low back at times.  Past Medical History  Diagnosis Date  . Fibromyalgia   . Obesity   . Occipital headache   . Vertebral artery dissection (HCC)     right   . Shingles   . Hypertension   . Endometriosis   . Diabetes (HCC)   . Chronic renal insufficiency   . Plantar fasciitis   . Orthostatic hypotension 02/02/2015  . Cervical spondylosis without myelopathy 08/10/2015    Past Surgical History  Procedure Laterality Date  . Gallbladder surgery    . Hysterotomy    . Appendectomy    . Tonsillectomy    . Total knee arthroplasty Bilateral   . Cataract extraction      left 4/15 right 5/15    Family History  Problem Relation Age of Onset  . Alzheimer's disease Mother   . Congestive Heart Failure Father   . Diabetes Sister   . Hypertension Brother   . Diabetes Brother   . Diabetes Brother     Social history:  reports that she has quit smoking.  She has never used smokeless tobacco. She reports that she does not drink alcohol or use illicit drugs.   No Known Allergies  Medications:  Prior to Admission medications   Medication Sig Start Date End Date Taking? Authorizing Provider  aspirin 81 MG tablet Take 81 mg by mouth daily.   Yes Historical Provider, MD  BIOTIN PO Take by mouth.   Yes Historical Provider, MD  Calcium-Magnesium-Vitamin D (CALCIUM 500 PO) Take 500 mg by mouth 2 (two) times daily.   Yes Historical Provider, MD  Cholecalciferol (VITAMIN D-3) 1000 UNITS CAPS Take 1,000 Units by mouth 3 (three) times daily.   Yes Historical Provider, MD  Cinnamon 500 MG TABS Take 500 mg by mouth 2 (two) times daily.    Yes Historical Provider, MD  COCONUT OIL PO Take 2 capsules by mouth daily.   Yes Historical Provider, MD  dicyclomine (BENTYL) 20 MG tablet Take 20 mg by mouth 3 (three) times daily before meals.   Yes Historical Provider, MD  DULoxetine (CYMBALTA) 60 MG capsule TAKE ONE CAPSULE BY MOUTH TWICE DAILY 01/04/16  Yes York Spaniel, MD  FIBER PO Take 2 tablets by mouth daily.   Yes Historical Provider, MD  furosemide (LASIX) 40 MG tablet Take 40 mg by mouth. 2 in the morning and 1 at  4:00   Yes Historical Provider, MD  gabapentin (NEURONTIN) 400 MG capsule Take 2 caps in the morning, supper time and  Night . Take 3 caps at noon. 11/16/13  Yes Nilda Riggs, NP  insulin glargine (LANTUS) 100 UNIT/ML injection Inject 100 Units into the skin at bedtime. 40 units in the am and 10 in the pm   Yes Historical Provider, MD  Insulin Lispro, Human, (HUMALOG [place]) Inject into the skin. 2-3 times daily   Yes Historical Provider, MD  metoprolol succinate (TOPROL XL) 25 MG 24 hr tablet Take 25 mg by mouth 2 (two) times daily.   Yes Historical Provider, MD  Omega-3 Fatty Acids (FISH OIL) 1200 MG CAPS Take 1,200 mg by mouth 3 (three) times daily.   Yes Historical Provider, MD  omeprazole (PRILOSEC) 20 MG capsule Take 20 mg by mouth 2  (two) times daily.    Yes Historical Provider, MD  ramipril (ALTACE) 10 MG capsule Take 10 mg by mouth 2 (two) times daily. Two tabs daily   Yes Historical Provider, MD  traMADol (ULTRAM) 50 MG tablet Take 50 mg by mouth every 6 (six) hours as needed.   Yes Historical Provider, MD  TURMERIC PO Take by mouth 2 (two) times daily.   Yes Historical Provider, MD  UNABLE TO FIND Take 1 tablet by mouth daily. Med Name: stool softner   Yes Historical Provider, MD  vitamin E 400 UNIT capsule Take 400 Units by mouth daily.   Yes Historical Provider, MD  zolpidem (AMBIEN) 10 MG tablet TAKE ONE TABLET BY MOUTH AT BEDTIME AS NEEDED FOR SLEEP 12/04/15  Yes York Spaniel, MD    ROS:  Out of a complete 14 system review of symptoms, the patient complains only of the following symptoms, and all other reviewed systems are negative.   Ringing in the ears  Eye discharge , eye itching, blurred vision  Leg swelling  Joint pain, back pain, neck pain, neck stiffness  Headache  Blood pressure 121/77, pulse 62, height 5' 3.5" (1.613 m), weight 262 lb (118.842 kg).  Physical Exam  General: The patient is alert and cooperative at the time of the examination. The patient is moderately to markedly obese.  Skin: No significant peripheral edema is noted.   Neurologic Exam  Mental status: The patient is alert and oriented x 3 at the time of the examination. The patient has apparent normal recent and remote memory, with an apparently normal attention span and concentration ability.   Cranial nerves: Facial symmetry is present. Speech is normal, no aphasia or dysarthria is noted. Extraocular movements are full. Visual fields are full.  Motor: The patient has good strength in all 4 extremities.  Sensory examination: Soft touch sensation is symmetric on the face, arms, and legs.  Coordination: The patient has good finger-nose-finger and heel-to-shin bilaterally.  Gait and station: The patient has a normal  gait. Tandem gait is slightly unsteady. Romberg is negative. No drift is seen.  Reflexes: Deep tendon reflexes are symmetric, but are depressed.   Assessment/Plan:   1. Obesity   2. Fibromyalgia   3. Cervical and lumbar spondylosis   4. Mild gait instability   The patient may try the TENS unit on the neck and shoulders. She was given a prescription for the gabapentin and Cymbalta. She will follow-up in 6 months. We may consider MRI of the cervical spine and consider epidural steroid injections on the neck in the future if the pain worsens. I have  encouraged patient to continue her weight loss and exercise program. She will contact me if she is having difficulties.  Marlan Palau MD 02/17/2016 2:16 PM  [facility] [address] [place], [place] [address]  Phone [phone] Fax [phone]

## 2016-05-07 ENCOUNTER — Telehealth: Payer: Self-pay | Admitting: Neurology

## 2016-05-07 NOTE — Telephone Encounter (Signed)
Chloe [name] [id number] [name] [phone] * [phone] 1 49 [name] NEEDS TO BE RUN THROUGHT HER INSURANCE COMPANY/PCB BEFORE 2:15 N    Operator [facility] of message [facility]

## 2016-07-05 ENCOUNTER — Other Ambulatory Visit: Payer: Self-pay | Admitting: Neurology

## 2016-07-08 NOTE — Telephone Encounter (Signed)
RX for [employer] [facility]. recommended e

## 2016-07-08 NOTE — Telephone Encounter (Signed)
Dr. Anne HahnWillis pt  Last OV 02/16/16 F/u scheduled 08/16/16  Last refill 12/04/15 for #30 x 6 mos

## 2016-07-16 ENCOUNTER — Telehealth: Payer: Self-pay | Admitting: Neurology

## 2016-07-16 MED ORDER — ZOLPIDEM TARTRATE 10 MG PO TABS: 10.0000 mg | ORAL_TABLET | Freq: Every evening | ORAL | 2 refills | Status: DC | PRN

## 2016-07-16 NOTE — Telephone Encounter (Signed)
New Ambien rx printed to meet quantity limitations, awaiting signature.

## 2016-07-16 NOTE — Telephone Encounter (Signed)
Aaron/[employer] is calling and states he needs another PA for this patient's Rx Zolpidem 10 mg.  He stated quantities to exceed 90 tablets is over the limit for this PA(5 refills X 30). File #[id number].

## 2016-07-17 ENCOUNTER — Telehealth: Payer: Self-pay | Admitting: *Deleted

## 2016-07-17 NOTE — Telephone Encounter (Signed)
Ambien rx signed, faxed and confirmed to [facility] at [phone].

## 2016-07-17 NOTE — Telephone Encounter (Signed)
Rx printed, signed, faxed to pharmacy. 

## 2016-08-16 ENCOUNTER — Encounter: Payer: Self-pay | Admitting: Neurology

## 2016-08-16 ENCOUNTER — Ambulatory Visit (INDEPENDENT_AMBULATORY_CARE_PROVIDER_SITE_OTHER): Payer: Medicare Other | Admitting: Neurology

## 2016-08-16 VITALS — BP 92/57 | HR 51 | Ht 63.5 in | Wt 261.5 lb

## 2016-08-16 DIAGNOSIS — M47812 Spondylosis without myelopathy or radiculopathy, cervical region: Secondary | ICD-10-CM | POA: Diagnosis not present

## 2016-08-16 DIAGNOSIS — G44221 Chronic tension-type headache, intractable: Secondary | ICD-10-CM

## 2016-08-16 DIAGNOSIS — M791 Myalgia: Secondary | ICD-10-CM | POA: Diagnosis not present

## 2016-08-16 DIAGNOSIS — IMO0001 Reserved for inherently not codable concepts without codable children: Secondary | ICD-10-CM

## 2016-08-16 DIAGNOSIS — M609 Myositis, unspecified: Secondary | ICD-10-CM

## 2016-08-16 MED ORDER — GABAPENTIN 400 MG PO CAPS: ORAL_CAPSULE | ORAL | 6 refills | Status: DC

## 2016-08-16 MED ORDER — DULOXETINE HCL 60 MG PO CPEP
60.0000 mg | ORAL_CAPSULE | Freq: Two times a day (BID) | ORAL | 5 refills | Status: DC
Start: 2016-08-16 — End: 2017-02-19

## 2016-08-16 NOTE — Progress Notes (Signed)
Reason for visit: Fibromyalgia  Chloe [name] is an 70 y.o. female  History of present illness:  Ms. Foley is a 70 year old right-handed white female with a history of obesity, diabetes, chronic neck and low back pain, and a history of fibromyalgia. The patient claims that she had MRI evaluation of the lumbar spine done through her orthopedic doctor in March 2017. The patient was told that she needed surgery, but the patient does not wish to have any type of surgery. The patient has apparent fallen in March, she fractured her wrist, she is healed from this at this point. The patient is on gabapentin and Cymbalta for her neuromuscular discomfort. She has ongoing pain.   Past Medical History:  Diagnosis Date  . Cervical spondylosis without myelopathy 08/10/2015  . Chronic renal insufficiency   . Diabetes (HCC)   . Endometriosis   . Fibromyalgia   . Hypertension   . Obesity   . Occipital headache   . Orthostatic hypotension 02/02/2015  . Plantar fasciitis   . Shingles   . 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 APPENDECTOMY    . CATARACT EXTRACTION     left 4/15 right 5/15  . GALLBLADDER SURGERY    . HYSTEROTOMY    . TONSILLECTOMY    . TOTAL KNEE ARTHROPLASTY Bilateral     Family History  Problem Relation Age of Onset  . Alzheimer's disease Mother   . Congestive Heart Failure Father   . Diabetes Sister   . Hypertension Brother   . Diabetes Brother   . Diabetes Brother     Social history:  reports that she has quit smoking. She has never used smokeless tobacco. She reports that she does not drink alcohol or use drugs.   No Known Allergies  Medications:  Prior to Admission medications   Medication Sig Start Date End Date Taking? Authorizing Provider  aspirin 81 MG tablet Take 81 mg by mouth daily.   Yes Historical Provider, MD  BIOTIN PO Take by mouth.   Yes Historical Provider, MD    Calcium-Magnesium-Vitamin D (CALCIUM 500 PO) Take 500 mg by mouth 2 (two) times daily.   Yes Historical Provider, MD  Cholecalciferol (VITAMIN D-3) 1000 UNITS CAPS Take 1,000 Units by mouth 3 (three) times daily.   Yes Historical Provider, MD  Cinnamon 500 MG TABS Take 500 mg by mouth 2 (two) times daily.    Yes Historical Provider, MD  COCONUT OIL PO Take 2 capsules by mouth daily.   Yes Historical Provider, MD  dicyclomine (BENTYL) 20 MG tablet Take 20 mg by mouth 3 (three) times daily before meals.   Yes Historical Provider, MD  DULoxetine (CYMBALTA) 60 MG capsule Take 1 capsule (60 mg total) by mouth 2 (two) times daily. 02/16/16  Yes York Spaniel, MD  FIBER PO Take 2 tablets by mouth daily.   Yes Historical Provider, MD  furosemide (LASIX) 40 MG tablet Take 40 mg by mouth. 2 in the morning and 1 at 4:00   Yes Historical Provider, MD  gabapentin (NEURONTIN) 400 MG capsule Take 2 caps in the morning, supper time and  Night . Take 3 caps at noon. 02/16/16  Yes York Spaniel, MD  insulin glargine (LANTUS) 100 UNIT/ML injection Inject 100 Units into the skin at bedtime. 40 units in the am and 10 in the pm   Yes Historical Provider, MD  Insulin  Lispro, Human, (HUMALOG [place]) Inject into the skin. 2-3 times daily   Yes Historical Provider, MD  metoprolol succinate (TOPROL XL) 25 MG 24 hr tablet Take 25 mg by mouth 2 (two) times daily.   Yes Historical Provider, MD  Omega-3 Fatty Acids (FISH OIL) 1200 MG CAPS Take 1,200 mg by mouth 3 (three) times daily.   Yes Historical Provider, MD  omeprazole (PRILOSEC) 20 MG capsule Take 20 mg by mouth 2 (two) times daily.    Yes Historical Provider, MD  ramipril (ALTACE) 10 MG capsule Take 10 mg by mouth daily. Two tabs daily   Yes Historical Provider, MD  traMADol (ULTRAM) 50 MG tablet Take 50 mg by mouth every 6 (six) hours as needed.   Yes Historical Provider, MD  TURMERIC PO Take by mouth 2 (two) times daily.   Yes Historical Provider, MD  UNABLE TO FIND  Take 1 tablet by mouth daily. Med Name: stool softner   Yes Historical Provider, MD  vitamin E 400 UNIT capsule Take 400 Units by mouth daily.   Yes Historical Provider, MD  zolpidem (AMBIEN) 10 MG tablet Take 1 tablet (10 mg total) by mouth at bedtime as needed. for sleep 07/16/16  Yes York Spanielharles K Roxy Filler, MD    ROS:  Out of a complete 14 system review of symptoms, the patient complains only of the following symptoms, and all other reviewed systems are negative.  Fatigue Ringing in the ears Blurred vision Joint pain, back pain, achy muscles Dizziness, weakness  Blood pressure (!) 92/57, pulse (!) 51, height 5' 3.5" (1.613 m), weight 261 lb 8 oz (118.6 kg).  Physical Exam  General: The patient is alert and cooperative at the time of the examination. The patient is markedly obese.  Skin: No significant peripheral edema is noted.   Neurologic Exam  Mental status: The patient is alert and oriented x 3 at the time of the examination. The patient has apparent normal recent and remote memory, with an apparently normal attention span and concentration ability.   Cranial nerves: Facial symmetry is present. Speech is normal, no aphasia or dysarthria is noted. Extraocular movements are full. Visual fields are full.  Motor: The patient has good strength in all 4 extremities, with exception of some difficulty with hip flexion on the left, 4/5 strength.  Sensory examination: Soft touch sensation is symmetric on the face, arms, and legs.  Coordination: The patient has good finger-nose-finger and heel-to-shin bilaterally.  Gait and station: The patient has a slightly wide-based gait. The patient uses a cane for ambulation. Tandem gait was not attempted. Romberg is negative.  Reflexes: Deep tendon reflexes are symmetric, but are depressed.   Assessment/Plan:  1. Obesity  2. Chronic neck and low back pain  3. Fibromyalgia  I recommended consideration for a lumbar epidural steroid  injection. The patient may contact me if she wishes to pursue this. The patient is having some dizziness with standing, she is running a systolic blood pressure sitting of 92. The patient has had documented orthostatic hypotension previously, she may require some adjustments of her blood pressure medication. She is on a diuretic and ramipril. She will follow-up in 6 months, sooner if needed.  Marlan Palau. Keith Dempsy Damiano MD 08/16/2016 11:23 AM  [facility] [address] [place], [place] [id number]  Phone [phone] Fax [phone]

## 2016-08-20 ENCOUNTER — Telehealth: Payer: Self-pay

## 2016-08-20 NOTE — Telephone Encounter (Signed)
Completed pa for quantity limit on ambien for pt via phone through [employer]. I was informed that pt's insurance will only allow 90 tablets per year for ambien, and therefore, a quantity limit approval must be obtained. Dx: chronic insomnia. Insurance will fax a determination in 3-5 business days.

## 2016-08-20 NOTE — Telephone Encounter (Signed)
Ref #: [id number]

## 2016-08-23 NOTE — Telephone Encounter (Signed)
PA for Ambien was denied. Called pt who said she would still like to appeal. Discussed [email or web address] for rx cost savings. Info and discount drug card mailed to pt as requested.

## 2017-01-07 ENCOUNTER — Ambulatory Visit (HOSPITAL_COMMUNITY): Payer: Medicare Other | Admitting: Certified Registered Nurse Anesthetist

## 2017-01-07 ENCOUNTER — Ambulatory Visit (HOSPITAL_COMMUNITY): Payer: Medicare Other

## 2017-01-07 ENCOUNTER — Ambulatory Visit: Payer: Self-pay | Admitting: Orthopedic Surgery

## 2017-01-07 ENCOUNTER — Encounter (HOSPITAL_COMMUNITY)
Admission: AD | Disposition: A | Discharge status: Skilled Nursing Facility | Payer: Self-pay | Source: Ambulatory Visit | Attending: Orthopedic Surgery

## 2017-01-07 ENCOUNTER — Inpatient Hospital Stay (HOSPITAL_COMMUNITY)
Admission: AD | Admit: 2017-01-07 | Discharge: 2017-01-10 | DRG: 493 | Disposition: A | Discharge status: Skilled Nursing Facility | Payer: Medicare Other | Source: Ambulatory Visit | Attending: Orthopedic Surgery | Admitting: Orthopedic Surgery

## 2017-01-07 ENCOUNTER — Inpatient Hospital Stay (HOSPITAL_COMMUNITY): Payer: Medicare Other

## 2017-01-07 ENCOUNTER — Encounter (HOSPITAL_COMMUNITY): Payer: Self-pay | Admitting: *Deleted

## 2017-01-07 DIAGNOSIS — M797 Fibromyalgia: Secondary | ICD-10-CM | POA: Diagnosis present

## 2017-01-07 DIAGNOSIS — Z87891 Personal history of nicotine dependence: Secondary | ICD-10-CM | POA: Diagnosis not present

## 2017-01-07 DIAGNOSIS — I129 Hypertensive chronic kidney disease with stage 1 through stage 4 chronic kidney disease, or unspecified chronic kidney disease: Secondary | ICD-10-CM | POA: Diagnosis present

## 2017-01-07 DIAGNOSIS — Z91048 Other nonmedicinal substance allergy status: Secondary | ICD-10-CM

## 2017-01-07 DIAGNOSIS — R262 Difficulty in walking, not elsewhere classified: Secondary | ICD-10-CM

## 2017-01-07 DIAGNOSIS — Z09 Encounter for follow-up examination after completed treatment for conditions other than malignant neoplasm: Secondary | ICD-10-CM

## 2017-01-07 DIAGNOSIS — Z794 Long term (current) use of insulin: Secondary | ICD-10-CM | POA: Diagnosis not present

## 2017-01-07 DIAGNOSIS — Z7982 Long term (current) use of aspirin: Secondary | ICD-10-CM

## 2017-01-07 DIAGNOSIS — S82402A Unspecified fracture of shaft of left fibula, initial encounter for closed fracture: Secondary | ICD-10-CM | POA: Diagnosis present

## 2017-01-07 DIAGNOSIS — Z885 Allergy status to narcotic agent status: Secondary | ICD-10-CM

## 2017-01-07 DIAGNOSIS — S82853A Displaced trimalleolar fracture of unspecified lower leg, initial encounter for closed fracture: Secondary | ICD-10-CM | POA: Diagnosis present

## 2017-01-07 DIAGNOSIS — M79604 Pain in right leg: Secondary | ICD-10-CM

## 2017-01-07 DIAGNOSIS — Z96653 Presence of artificial knee joint, bilateral: Secondary | ICD-10-CM | POA: Diagnosis present

## 2017-01-07 DIAGNOSIS — Z01818 Encounter for other preprocedural examination: Secondary | ICD-10-CM

## 2017-01-07 DIAGNOSIS — W010XXA Fall on same level from slipping, tripping and stumbling without subsequent striking against object, initial encounter: Secondary | ICD-10-CM | POA: Diagnosis present

## 2017-01-07 DIAGNOSIS — E1122 Type 2 diabetes mellitus with diabetic chronic kidney disease: Secondary | ICD-10-CM | POA: Diagnosis present

## 2017-01-07 DIAGNOSIS — N189 Chronic kidney disease, unspecified: Secondary | ICD-10-CM | POA: Diagnosis present

## 2017-01-07 DIAGNOSIS — Z833 Family history of diabetes mellitus: Secondary | ICD-10-CM | POA: Diagnosis not present

## 2017-01-07 DIAGNOSIS — M1990 Unspecified osteoarthritis, unspecified site: Secondary | ICD-10-CM | POA: Diagnosis present

## 2017-01-07 DIAGNOSIS — Y92009 Unspecified place in unspecified non-institutional (private) residence as the place of occurrence of the external cause: Secondary | ICD-10-CM | POA: Diagnosis not present

## 2017-01-07 DIAGNOSIS — Z6841 Body Mass Index (BMI) 40.0 and over, adult: Secondary | ICD-10-CM | POA: Diagnosis not present

## 2017-01-07 DIAGNOSIS — S82891A Other fracture of right lower leg, initial encounter for closed fracture: Secondary | ICD-10-CM

## 2017-01-07 DIAGNOSIS — Z8249 Family history of ischemic heart disease and other diseases of the circulatory system: Secondary | ICD-10-CM | POA: Diagnosis not present

## 2017-01-07 DIAGNOSIS — G709 Myoneural disorder, unspecified: Secondary | ICD-10-CM | POA: Diagnosis present

## 2017-01-07 DIAGNOSIS — T148XXA Other injury of unspecified body region, initial encounter: Secondary | ICD-10-CM

## 2017-01-07 DIAGNOSIS — Z79899 Other long term (current) drug therapy: Secondary | ICD-10-CM

## 2017-01-07 HISTORY — DX: Dissection of cerebral arteries, nonruptured: I67.0

## 2017-01-07 HISTORY — DX: Chronic kidney disease, stage 3 (moderate): N18.3

## 2017-01-07 HISTORY — DX: Gastro-esophageal reflux disease without esophagitis: K21.9

## 2017-01-07 HISTORY — DX: Pure hypercholesterolemia, unspecified: E78.00

## 2017-01-07 HISTORY — DX: Type 2 diabetes mellitus without complications: E11.9

## 2017-01-07 HISTORY — PX: ANKLE CLOSED REDUCTION: SHX880

## 2017-01-07 HISTORY — DX: Personal history of other diseases of the digestive system: Z87.19

## 2017-01-07 HISTORY — DX: Low back pain: M54.5

## 2017-01-07 HISTORY — DX: Chronic kidney disease, stage 3 unspecified: N18.30

## 2017-01-07 HISTORY — DX: Cerebral infarction, unspecified: I63.9

## 2017-01-07 HISTORY — DX: Other asthma: J45.998

## 2017-01-07 HISTORY — DX: Gastric ulcer, unspecified as acute or chronic, without hemorrhage or perforation: K25.9

## 2017-01-07 HISTORY — DX: Low back pain, unspecified: M54.50

## 2017-01-07 HISTORY — DX: Family history of other specified conditions: Z84.89

## 2017-01-07 HISTORY — DX: Other chronic pain: G89.29

## 2017-01-07 HISTORY — DX: Other fracture of right lower leg, initial encounter for closed fracture: S82.891A

## 2017-01-07 HISTORY — PX: EXTERNAL FIXATION LEG: SHX1549

## 2017-01-07 LAB — BASIC METABOLIC PANEL
Anion gap: 13 (ref 5–15)
BUN: 26 mg/dL — AB (ref 6–20)
CO2: 27 mmol/L (ref 22–32)
Calcium: 9.3 mg/dL (ref 8.9–10.3)
Chloride: 97 mmol/L — ABNORMAL LOW (ref 101–111)
Creatinine, Ser: 1.82 mg/dL — ABNORMAL HIGH (ref 0.44–1.00)
GFR calc Af Amer: 32 mL/min — ABNORMAL LOW (ref >60)
GFR, EST NON AFRICAN AMERICAN: 27 mL/min — AB (ref >60)
GLUCOSE: 88 mg/dL (ref 65–99)
POTASSIUM: 3.7 mmol/L (ref 3.5–5.1)
Sodium: 137 mmol/L (ref 135–145)

## 2017-01-07 LAB — CBC
HEMATOCRIT: 36 % (ref 36.0–46.0)
HEMOGLOBIN: 11.6 g/dL — AB (ref 12.0–15.0)
MCH: 29 pg (ref 26.0–34.0)
MCHC: 32.2 g/dL (ref 30.0–36.0)
MCV: 90 fL (ref 78.0–100.0)
Platelets: 158 10*3/uL (ref 150–400)
RBC: 4 MIL/uL (ref 3.87–5.11)
RDW: 13.5 % (ref 11.5–15.5)
WBC: 8.2 10*3/uL (ref 4.0–10.5)

## 2017-01-07 LAB — GLUCOSE, CAPILLARY: GLUCOSE-CAPILLARY: 105 mg/dL — AB (ref 65–99)

## 2017-01-07 SURGERY — EXTERNAL FIXATION, LOWER EXTREMITY
Anesthesia: General | Site: Leg Lower | Laterality: Right

## 2017-01-07 MED ORDER — INSULIN GLARGINE 100 UNIT/ML ~~LOC~~ SOLN
40.0000 [IU] | Freq: Every day | SUBCUTANEOUS | Status: DC
  Administered 2017-01-08 – 2017-01-10 (×3): 40 [IU] via SUBCUTANEOUS
  Filled 2017-01-07 (×3): qty 0.4

## 2017-01-07 MED ORDER — OXYCODONE HCL 5 MG/5ML PO SOLN: 5.0000 mg | Freq: Once | ORAL | Status: DC | PRN

## 2017-01-07 MED ORDER — FUROSEMIDE 40 MG PO TABS
40.0000 mg | ORAL_TABLET | Freq: Every day | ORAL | Status: DC
  Administered 2017-01-08 – 2017-01-10 (×3): 40 mg via ORAL
  Filled 2017-01-07 (×3): qty 1

## 2017-01-07 MED ORDER — ZOLPIDEM TARTRATE 5 MG PO TABS
5.0000 mg | ORAL_TABLET | Freq: Every evening | ORAL | Status: DC | PRN
  Administered 2017-01-09: 5 mg via ORAL
  Filled 2017-01-07: qty 1

## 2017-01-07 MED ORDER — HYDROMORPHONE HCL 1 MG/ML IJ SOLN
0.2500 mg | INTRAMUSCULAR | Status: DC | PRN
  Administered 2017-01-07 (×2): 0.5 mg via INTRAVENOUS

## 2017-01-07 MED ORDER — 0.9 % SODIUM CHLORIDE (POUR BTL) OPTIME
TOPICAL | Status: DC | PRN
  Administered 2017-01-07: 1000 mL

## 2017-01-07 MED ORDER — DEXTROSE 5 % IV SOLN
3.0000 g | INTRAVENOUS | Status: AC
  Administered 2017-01-07: 3 g via INTRAVENOUS
  Filled 2017-01-07: qty 3000

## 2017-01-07 MED ORDER — PROPOFOL 10 MG/ML IV BOLUS
INTRAVENOUS | Status: DC | PRN
  Administered 2017-01-07: 30 mg via INTRAVENOUS
  Administered 2017-01-07: 170 mg via INTRAVENOUS

## 2017-01-07 MED ORDER — METOPROLOL SUCCINATE ER 25 MG PO TB24
25.0000 mg | ORAL_TABLET | Freq: Two times a day (BID) | ORAL | Status: DC
  Administered 2017-01-08 – 2017-01-10 (×6): 25 mg via ORAL
  Filled 2017-01-07 (×6): qty 1

## 2017-01-07 MED ORDER — EPHEDRINE SULFATE 50 MG/ML IJ SOLN
INTRAMUSCULAR | Status: DC | PRN
  Administered 2017-01-07 (×4): 10 mg via INTRAVENOUS

## 2017-01-07 MED ORDER — ONDANSETRON HCL 4 MG/2ML IJ SOLN
INTRAMUSCULAR | Status: AC
  Filled 2017-01-07: qty 4

## 2017-01-07 MED ORDER — HYDROCODONE-ACETAMINOPHEN 5-325 MG PO TABS
1.0000 | ORAL_TABLET | ORAL | Status: DC | PRN
  Administered 2017-01-08 – 2017-01-10 (×9): 2 via ORAL
  Filled 2017-01-07 (×9): qty 2

## 2017-01-07 MED ORDER — PHENYLEPHRINE HCL 10 MG/ML IJ SOLN
INTRAMUSCULAR | Status: AC
  Filled 2017-01-07: qty 1

## 2017-01-07 MED ORDER — RAMIPRIL 10 MG PO CAPS
10.0000 mg | ORAL_CAPSULE | Freq: Every day | ORAL | Status: DC
  Administered 2017-01-08 – 2017-01-10 (×3): 10 mg via ORAL
  Filled 2017-01-07 (×3): qty 1

## 2017-01-07 MED ORDER — METHOCARBAMOL 500 MG PO TABS
500.0000 mg | ORAL_TABLET | Freq: Four times a day (QID) | ORAL | Status: DC | PRN
  Administered 2017-01-08 – 2017-01-09 (×2): 500 mg via ORAL
  Filled 2017-01-07 (×3): qty 1

## 2017-01-07 MED ORDER — CEFAZOLIN SODIUM-DEXTROSE 2-4 GM/100ML-% IV SOLN
2.0000 g | Freq: Four times a day (QID) | INTRAVENOUS | Status: AC
  Administered 2017-01-08 (×3): 2 g via INTRAVENOUS
  Filled 2017-01-07 (×3): qty 100

## 2017-01-07 MED ORDER — LACTATED RINGERS IV SOLN
INTRAVENOUS | Status: DC
  Administered 2017-01-07 (×3): via INTRAVENOUS

## 2017-01-07 MED ORDER — ONDANSETRON HCL 4 MG/2ML IJ SOLN: 4.0000 mg | Freq: Four times a day (QID) | INTRAMUSCULAR | Status: DC | PRN

## 2017-01-07 MED ORDER — ONDANSETRON HCL 4 MG PO TABS: 4.0000 mg | ORAL_TABLET | Freq: Four times a day (QID) | ORAL | Status: DC | PRN

## 2017-01-07 MED ORDER — PANTOPRAZOLE SODIUM 40 MG PO TBEC
40.0000 mg | DELAYED_RELEASE_TABLET | Freq: Every day | ORAL | Status: DC
  Administered 2017-01-08 – 2017-01-10 (×3): 40 mg via ORAL
  Filled 2017-01-07 (×3): qty 1

## 2017-01-07 MED ORDER — ACETAMINOPHEN 650 MG RE SUPP: 650.0000 mg | Freq: Four times a day (QID) | RECTAL | Status: DC | PRN

## 2017-01-07 MED ORDER — OXYCODONE HCL 5 MG PO TABS: 5.0000 mg | ORAL_TABLET | Freq: Once | ORAL | Status: DC | PRN

## 2017-01-07 MED ORDER — HYDROMORPHONE HCL 2 MG/ML IJ SOLN
0.5000 mg | INTRAMUSCULAR | Status: DC | PRN
  Administered 2017-01-08 – 2017-01-09 (×3): 0.5 mg via INTRAVENOUS
  Filled 2017-01-07 (×3): qty 1

## 2017-01-07 MED ORDER — CHLORHEXIDINE GLUCONATE 4 % EX LIQD: 60.0000 mL | Freq: Once | CUTANEOUS | Status: DC

## 2017-01-07 MED ORDER — GABAPENTIN 400 MG PO CAPS
1200.0000 mg | ORAL_CAPSULE | Freq: Every day | ORAL | Status: DC
  Administered 2017-01-08 – 2017-01-10 (×3): 1200 mg via ORAL
  Filled 2017-01-07 (×4): qty 3

## 2017-01-07 MED ORDER — METOCLOPRAMIDE HCL 5 MG/ML IJ SOLN: 5.0000 mg | Freq: Three times a day (TID) | INTRAMUSCULAR | Status: DC | PRN

## 2017-01-07 MED ORDER — FENTANYL CITRATE (PF) 100 MCG/2ML IJ SOLN
INTRAMUSCULAR | Status: AC
  Filled 2017-01-07: qty 4

## 2017-01-07 MED ORDER — MAGNESIUM OXIDE 400 (241.3 MG) MG PO TABS
200.0000 mg | ORAL_TABLET | Freq: Two times a day (BID) | ORAL | Status: DC
  Administered 2017-01-08 – 2017-01-10 (×4): 200 mg via ORAL
  Filled 2017-01-07 (×5): qty 1

## 2017-01-07 MED ORDER — MIDAZOLAM HCL 2 MG/2ML IJ SOLN
INTRAMUSCULAR | Status: DC | PRN
  Administered 2017-01-07: 2 mg via INTRAVENOUS

## 2017-01-07 MED ORDER — DICYCLOMINE HCL 20 MG PO TABS
20.0000 mg | ORAL_TABLET | Freq: Three times a day (TID) | ORAL | Status: DC
  Administered 2017-01-08 – 2017-01-10 (×8): 20 mg via ORAL
  Filled 2017-01-07 (×8): qty 1

## 2017-01-07 MED ORDER — CEFAZOLIN IN D5W 1 GM/50ML IV SOLN
INTRAVENOUS | Status: AC
  Filled 2017-01-07: qty 50

## 2017-01-07 MED ORDER — ACETAMINOPHEN 325 MG PO TABS
650.0000 mg | ORAL_TABLET | Freq: Four times a day (QID) | ORAL | Status: DC | PRN
Start: 2017-01-07 — End: 2017-01-10

## 2017-01-07 MED ORDER — CALCIUM POLYCARBOPHIL 625 MG PO TABS
ORAL_TABLET | Freq: Every day | ORAL | Status: DC
  Administered 2017-01-08 – 2017-01-10 (×3): via ORAL
  Filled 2017-01-07 (×3): qty 1

## 2017-01-07 MED ORDER — CALCIUM-MAGNESIUM-VITAMIN D 500-250-200 MG-MG-UNIT PO TABS: ORAL_TABLET | Freq: Two times a day (BID) | ORAL | Status: DC

## 2017-01-07 MED ORDER — EPHEDRINE 5 MG/ML INJ
INTRAVENOUS | Status: AC
  Filled 2017-01-07: qty 10

## 2017-01-07 MED ORDER — VITAMIN D 1000 UNITS PO TABS
1000.0000 [IU] | ORAL_TABLET | Freq: Three times a day (TID) | ORAL | Status: DC
  Administered 2017-01-08 – 2017-01-10 (×6): 1000 [IU] via ORAL
  Filled 2017-01-07 (×7): qty 1

## 2017-01-07 MED ORDER — FENTANYL CITRATE (PF) 100 MCG/2ML IJ SOLN
INTRAMUSCULAR | Status: DC | PRN
  Administered 2017-01-07 (×2): 50 ug via INTRAVENOUS

## 2017-01-07 MED ORDER — PROPOFOL 10 MG/ML IV BOLUS
INTRAVENOUS | Status: AC
  Filled 2017-01-07: qty 20

## 2017-01-07 MED ORDER — ONDANSETRON HCL 4 MG/2ML IJ SOLN
INTRAMUSCULAR | Status: DC | PRN
  Administered 2017-01-07: 4 mg via INTRAVENOUS

## 2017-01-07 MED ORDER — METOCLOPRAMIDE HCL 5 MG PO TABS: 5.0000 mg | ORAL_TABLET | Freq: Three times a day (TID) | ORAL | Status: DC | PRN

## 2017-01-07 MED ORDER — OMEGA-3-ACID ETHYL ESTERS 1 G PO CAPS
1.0000 g | ORAL_CAPSULE | Freq: Two times a day (BID) | ORAL | Status: DC
  Administered 2017-01-08 – 2017-01-10 (×4): 1 g via ORAL
  Filled 2017-01-07 (×5): qty 1

## 2017-01-07 MED ORDER — SENNA 8.6 MG PO TABS
1.0000 | ORAL_TABLET | Freq: Two times a day (BID) | ORAL | Status: DC
Start: 2017-01-08 — End: 2017-01-10
  Administered 2017-01-08 – 2017-01-10 (×5): 8.6 mg via ORAL
  Filled 2017-01-07 (×5): qty 1

## 2017-01-07 MED ORDER — GABAPENTIN 400 MG PO CAPS
800.0000 mg | ORAL_CAPSULE | Freq: Three times a day (TID) | ORAL | Status: DC
  Administered 2017-01-08 – 2017-01-10 (×8): 800 mg via ORAL
  Filled 2017-01-07 (×8): qty 2

## 2017-01-07 MED ORDER — FUROSEMIDE 40 MG PO TABS
40.0000 mg | ORAL_TABLET | Freq: Every day | ORAL | Status: DC
  Administered 2017-01-08 – 2017-01-09 (×2): 40 mg via ORAL
  Filled 2017-01-07 (×2): qty 1

## 2017-01-07 MED ORDER — CEFAZOLIN SODIUM-DEXTROSE 2-4 GM/100ML-% IV SOLN
INTRAVENOUS | Status: AC
  Filled 2017-01-07: qty 100

## 2017-01-07 MED ORDER — ENOXAPARIN SODIUM 30 MG/0.3ML ~~LOC~~ SOLN
30.0000 mg | SUBCUTANEOUS | Status: DC
  Administered 2017-01-08 – 2017-01-10 (×3): 30 mg via SUBCUTANEOUS
  Filled 2017-01-07 (×3): qty 0.3

## 2017-01-07 MED ORDER — CALCIUM CARBONATE-VITAMIN D 500-200 MG-UNIT PO TABS
1.0000 | ORAL_TABLET | Freq: Two times a day (BID) | ORAL | Status: DC
  Administered 2017-01-08 – 2017-01-10 (×4): 1 via ORAL
  Filled 2017-01-07 (×5): qty 1

## 2017-01-07 MED ORDER — HYDROMORPHONE HCL 1 MG/ML IJ SOLN
INTRAMUSCULAR | Status: AC
  Administered 2017-01-07: 0.5 mg via INTRAVENOUS
  Filled 2017-01-07: qty 1

## 2017-01-07 MED ORDER — MIDAZOLAM HCL 2 MG/2ML IJ SOLN
INTRAMUSCULAR | Status: AC
  Filled 2017-01-07: qty 2

## 2017-01-07 MED ORDER — DULOXETINE HCL 60 MG PO CPEP
60.0000 mg | ORAL_CAPSULE | Freq: Two times a day (BID) | ORAL | Status: DC
  Administered 2017-01-08 – 2017-01-10 (×6): 60 mg via ORAL
  Filled 2017-01-07 (×4): qty 1
  Filled 2017-01-07: qty 2
  Filled 2017-01-07: qty 1

## 2017-01-07 MED ORDER — PHENYLEPHRINE HCL 10 MG/ML IJ SOLN
INTRAMUSCULAR | Status: DC | PRN
  Administered 2017-01-07: 50 ug/min via INTRAVENOUS

## 2017-01-07 MED ORDER — DOCUSATE SODIUM 100 MG PO CAPS
100.0000 mg | ORAL_CAPSULE | Freq: Two times a day (BID) | ORAL | Status: DC
  Administered 2017-01-08 – 2017-01-10 (×5): 100 mg via ORAL
  Filled 2017-01-07 (×5): qty 1

## 2017-01-07 MED ORDER — POLYETHYLENE GLYCOL 3350 17 G PO PACK: 17.0000 g | PACK | Freq: Every day | ORAL | Status: DC | PRN

## 2017-01-07 MED ORDER — SODIUM CHLORIDE 0.9 % IV SOLN
INTRAVENOUS | Status: DC
  Administered 2017-01-08: 01:00:00 via INTRAVENOUS

## 2017-01-07 MED ORDER — METHOCARBAMOL 1000 MG/10ML IJ SOLN
500.0000 mg | Freq: Four times a day (QID) | INTRAMUSCULAR | Status: DC | PRN
  Filled 2017-01-07: qty 5

## 2017-01-07 MED ORDER — VITAMIN E 180 MG (400 UNIT) PO CAPS
400.0000 [IU] | ORAL_CAPSULE | Freq: Every day | ORAL | Status: DC
  Administered 2017-01-08 – 2017-01-10 (×3): 400 [IU] via ORAL
  Filled 2017-01-07 (×3): qty 1

## 2017-01-07 MED ORDER — ROSUVASTATIN CALCIUM 5 MG PO TABS
5.0000 mg | ORAL_TABLET | Freq: Every day | ORAL | Status: DC
  Administered 2017-01-08 – 2017-01-10 (×3): 5 mg via ORAL
  Filled 2017-01-07 (×3): qty 1

## 2017-01-07 MED ORDER — INSULIN ASPART 100 UNIT/ML ~~LOC~~ SOLN
0.0000 [IU] | Freq: Three times a day (TID) | SUBCUTANEOUS | Status: DC
  Administered 2017-01-08: 3 [IU] via SUBCUTANEOUS

## 2017-01-07 SURGICAL SUPPLY — 26 items
BANDAGE ACE 4X5 VEL STRL LF (GAUZE/BANDAGES/DRESSINGS) ×2 IMPLANT
BANDAGE ELASTIC 3 VELCRO ST LF (GAUZE/BANDAGES/DRESSINGS) ×2 IMPLANT
BAR EXFX 200X11 NS LF (EXFIX) ×1
BAR EXFX 350X11 NS LF (EXFIX) ×1
BAR EXFX 400X11 NS LF (EXFIX) ×1
BAR GLASS FIBER EXFX 11X200 (EXFIX) ×2 IMPLANT
BAR GLASS FIBER EXFX 11X350 (EXFIX) ×2 IMPLANT
BAR GLASS FIBER EXFX 11X400 (EXFIX) ×2 IMPLANT
BNDG GAUZE ELAST 4 BULKY (GAUZE/BANDAGES/DRESSINGS) ×4 IMPLANT
CLAMP BLUE BAR TO PIN (EXFIX) ×8 IMPLANT
DRAPE C-ARMOR (DRAPES) ×2 IMPLANT
GAUZE SPONGE 4X4 12PLY STRL (GAUZE/BANDAGES/DRESSINGS) ×2 IMPLANT
GAUZE XEROFORM 1X8 LF (GAUZE/BANDAGES/DRESSINGS) ×2 IMPLANT
GAUZE XEROFORM 5X9 LF (GAUZE/BANDAGES/DRESSINGS) ×2 IMPLANT
GLOVE BIOGEL PI IND STRL 7.5 (GLOVE) IMPLANT
GLOVE BIOGEL PI IND STRL 8.5 (GLOVE) IMPLANT
GLOVE BIOGEL PI INDICATOR 7.5 (GLOVE) ×2
GLOVE BIOGEL PI INDICATOR 8.5 (GLOVE) ×2
GLOVE ECLIPSE 8.0 STRL XLNG CF (GLOVE) ×2 IMPLANT
GLOVE SURG ORTHO 8.0 STRL STRW (GLOVE) ×2 IMPLANT
HALF PIN 5.0X160 (EXFIX) ×4 IMPLANT
PACK ORTHO EXTREMITY (CUSTOM PROCEDURE TRAY) ×2 IMPLANT
PIN CLAMP 2BAR 75MM BLUE (EXFIX) ×2 IMPLANT
PIN TRANSFIXING 5.0 (EXFIX) ×2 IMPLANT
SPONGE GAUZE 4X4 12PLY STER LF (GAUZE/BANDAGES/DRESSINGS) ×2 IMPLANT
TOWEL OR 17X26 10 PK STRL BLUE (TOWEL DISPOSABLE) ×2 IMPLANT

## 2017-01-07 NOTE — Anesthesia Procedure Notes (Signed)
Procedure Name: LMA Insertion Date/Time: 01/07/2017 7:13 PM Performed by: Molli HazardGORDON, Truong Delcastillo M Pre-anesthesia Checklist: Patient identified, Emergency Drugs available, Suction available and Patient being monitored Patient Re-evaluated:Patient Re-evaluated prior to inductionOxygen Delivery Method: Circle system utilized Preoxygenation: Pre-oxygenation with 100% oxygen Intubation Type: IV induction Ventilation: Mask ventilation without difficulty LMA: LMA inserted LMA Size: 4.0 Number of attempts: 1 Placement Confirmation: positive ETCO2 Tube secured with: Tape Dental Injury: Teeth and Oropharynx as per pre-operative assessment

## 2017-01-07 NOTE — H&P (Signed)
ORTHOPAEDIC H&P  REQUESTING PHYSICIAN: Samson Frederic, MD  PCP:  Marylynn Pearson, MD  Chief Complaint: Right ankle fracture with lateral subluxation of talus  HPI: Chloe [name] is a 71 y.o. female with a history of obesity and diabetes. She has a history of bilateral total knee arthroplasties. She tripped and fell at home on 01/04/2017, and she injured both of her lower legs. She went to the emergency department at [facility] due to inability to weight-bear. X-rays were obtained, showing a left fibular shaft fracture, and a right bimalleolar equivalent ankle fracture with approximately 50% lateral subluxation of the talus. She was apparently splinted on both sides. I saw her in the office today. She was found to have persistent lateral subluxation of the right talus. She had profound swelling of the ankle. She had fracture blisters on the medial side of the ankle with impending skin compromise. I attempted a closed reduction in the office which was unsuccessful. She was then directed admitted to [facility] for urgent reduction and external fixator placement of the right ankle.  Past Medical History:  Diagnosis Date  . Cervical spondylosis without myelopathy 08/10/2015  . Chronic renal insufficiency   . Diabetes (HCC)   . Endometriosis   . Fibromyalgia   . Hypertension   . Obesity   . Occipital headache   . Orthostatic hypotension 02/02/2015  . Plantar fasciitis   . Shingles   . 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 APPENDECTOMY    . CATARACT EXTRACTION     left 4/15 right 5/15  . CLOSED REDUCTION NASAL FRACTURE  1990"s  . GALLBLADDER SURGERY    . HYSTEROTOMY    . TONSILLECTOMY    . TOTAL KNEE ARTHROPLASTY Bilateral    Social History   Social History  . Marital status: Married    Spouse name: Trey Paula  . Number of children: 4  . Years of education: 13+   Occupational History  . Retired       Social History Main Topics  . Smoking status: Former Games developer  . Smokeless tobacco: Never Used  . Alcohol use No  . Drug use: No  . Sexual activity: No   Other Topics Concern  . None   Social History Narrative   Patient lives at home with her husband.    Patient is a retired Architectural technologist.    Patient has a high school education and some college.    Patient has four children.   Patient is right handed.   Patient drinks about 2 cups caffeine daily.   Family History  Problem Relation Age of Onset  . Alzheimer's disease Mother   . Congestive Heart Failure Father   . Diabetes Sister   . Hypertension Brother   . Diabetes Brother   . Diabetes Brother    Allergies  Allergen Reactions  . Adhesive [Tape] Hives and Rash  . Codeine Nausea Only   Prior to Admission medications   Medication Sig Start Date End Date Taking? Authorizing Provider  aspirin 81 MG tablet Take 81 mg by mouth daily.   Yes Historical Provider, MD  BIOTIN PO Take by mouth.   Yes Historical Provider, MD  Calcium-Magnesium-Vitamin D (CALCIUM 500 PO) Take 500 mg by mouth 2 (two) times daily.   Yes Historical Provider, MD  Cholecalciferol (VITAMIN D-3) 1000 UNITS CAPS Take 1,000 Units by mouth 3 (three) times daily.   Yes  Historical Provider, MD  Cinnamon 500 MG TABS Take 500 mg by mouth 2 (two) times daily.    Yes Historical Provider, MD  COCONUT OIL PO Take 2 capsules by mouth daily.   Yes Historical Provider, MD  dicyclomine (BENTYL) 20 MG tablet Take 20 mg by mouth 3 (three) times daily before meals.   Yes Historical Provider, MD  DULoxetine (CYMBALTA) 60 MG capsule Take 1 capsule (60 mg total) by mouth 2 (two) times daily. 08/16/16  Yes York Spanielharles K Willis, MD  FIBER PO Take 2 tablets by mouth daily.   Yes Historical Provider, MD  furosemide (LASIX) 40 MG tablet Take 40 mg by mouth. 2 in the morning and 1 at 4:00   Yes Historical Provider, MD  gabapentin (NEURONTIN) 400 MG capsule Take 2 caps in the morning,  supper time and  Night . Take 3 caps at noon. 08/16/16  Yes York Spanielharles K Willis, MD  insulin glargine (LANTUS) 100 UNIT/ML injection Inject 100 Units into the skin at bedtime. 40 units in the am and 10 in the pm   Yes Historical Provider, MD  Insulin Lispro, Human, (HUMALOG [place]) Inject into the skin. 2-3 times daily   Yes Historical Provider, MD  metoprolol succinate (TOPROL XL) 25 MG 24 hr tablet Take 25 mg by mouth 2 (two) times daily.   Yes Historical Provider, MD  Omega-3 Fatty Acids (FISH OIL) 1200 MG CAPS Take 1,200 mg by mouth 3 (three) times daily.   Yes Historical Provider, MD  omeprazole (PRILOSEC) 20 MG capsule Take 20 mg by mouth 2 (two) times daily.    Yes Historical Provider, MD  ramipril (ALTACE) 10 MG capsule Take 10 mg by mouth daily. Two tabs daily   Yes Historical Provider, MD  rosuvastatin (CRESTOR) 5 MG tablet Take 5 mg by mouth daily.   Yes Historical Provider, MD  traMADol (ULTRAM) 50 MG tablet Take 50 mg by mouth every 6 (six) hours as needed.   Yes Historical Provider, MD  TURMERIC PO Take by mouth 2 (two) times daily.   Yes Historical Provider, MD  UNABLE TO FIND Take 1 tablet by mouth daily. Med Name: stool softner   Yes Historical Provider, MD  vitamin E 400 UNIT capsule Take 400 Units by mouth daily.   Yes Historical Provider, MD  zolpidem (AMBIEN) 10 MG tablet Take 1 tablet (10 mg total) by mouth at bedtime as needed. for sleep 07/16/16  Yes York Spanielharles K Willis, MD   No results found.  Positive ROS: All other systems have been reviewed and were otherwise negative with the exception of those mentioned in the HPI and as above.  Physical Exam: General: Alert, no acute distress Cardiovascular: No pedal edema Respiratory: No cyanosis, no use of accessory musculature GI: No organomegaly, abdomen is soft and non-tender Skin: No lesions in the area of chief complaint Neurologic: Sensation intact distally Psychiatric: Patient is competent for consent with normal mood and  affect Lymphatic: No axillary or cervical lymphadenopathy  MUSCULOSKELETAL: Examination of the right ankle reveals very significant swelling. She has large blood-filled fracture blisters on the medial aspect of the ankle. She has impending skin compromise over the medial malleolus. She has palpable pedal pulses. She reports subjective sensory change to the superficial peroneal distribution. She is able to wiggle her toes.  Assessment: #1 right bimalleolar equivalent ankle fracture with lateral subluxation of the talus #2 left fibula shaft fracture #3 morbid obesity  Plan: I discussed the findings with the patient and her  family. I recommended urgent closed reduction of the right ankle and placement of external fixator. They understand that once her soft tissue status has improved, she will need ORIF of the right ankle in a delayed fashion. After surgery today, we are going to admit her to the hospital. She will need physical and occupational therapy. She will most likely need skilled nursing placement.    Niajah Sipos, Cloyde Reams, MD Cell [phone]    01/07/2017 6:58 PM

## 2017-01-07 NOTE — Transfer of Care (Signed)
Immediate Anesthesia Transfer of Care Note  Patient: Chloe [name]  Procedure(s) Performed: Procedure(s): EXTERNAL FIXATION LEG (Right) CLOSED REDUCTION ANKLE (Right)  Patient Location: PACU  Anesthesia Type:General  Level of Consciousness: awake, alert  and oriented  Airway & Oxygen Therapy: Patient connected to nasal cannula oxygen  Post-op Assessment: Report given to RN and Post -op Vital signs reviewed and stable  Post vital signs: Reviewed and stable  Last Vitals:  Vitals:   01/07/17 1808 01/07/17 2015  BP: (!) 90/42   Pulse: 73   Resp: 18   Temp:  (P) 36.9 C    Last Pain:  Vitals:   01/07/17 2015  PainSc: (P) 4          Complications: No apparent anesthesia complications

## 2017-01-07 NOTE — Discharge Instructions (Signed)
Strict Non weight bearing right lower extremity Elevate toes above nose Dressing change to medial ankle with xeroform, 4x4s, ace wrap as needed

## 2017-01-07 NOTE — Anesthesia Preprocedure Evaluation (Signed)
Anesthesia Evaluation  Patient identified by MRN, date of birth, ID band Patient awake    Reviewed: Allergy & Precautions, H&P , NPO status , Patient's Chart, lab work & pertinent test results  Airway Mallampati: II   Neck ROM: full    Dental   Pulmonary former smoker,    breath sounds clear to auscultation       Cardiovascular hypertension, + Peripheral Vascular Disease   Rhythm:regular Rate:Normal     Neuro/Psych  Headaches,  Neuromuscular disease    GI/Hepatic   Endo/Other  diabetes, Type obesity  Renal/GU Renal InsufficiencyRenal disease     Musculoskeletal  (+) Arthritis , Fibromyalgia -  Abdominal   Peds  Hematology   Anesthesia Other Findings   Reproductive/Obstetrics                             Anesthesia Physical Anesthesia Plan  ASA: III  Anesthesia Plan: General   Post-op Pain Management:    Induction: Intravenous  Airway Management Planned: LMA  Additional Equipment:   Intra-op Plan:   Post-operative Plan:   Informed Consent: I have reviewed the patients History and Physical, chart, labs and discussed the procedure including the risks, benefits and alternatives for the proposed anesthesia with the patient or authorized representative who has indicated his/her understanding and acceptance.     Plan Discussed with: CRNA, Anesthesiologist and Surgeon  Anesthesia Plan Comments:         Anesthesia Quick Evaluation

## 2017-01-07 NOTE — Brief Op Note (Signed)
01/07/2017  7:56 PM  PATIENT:  Kem KaysSANTA M Leser  71 y.o. female  PRE-OPERATIVE DIAGNOSIS:  Right Ankle Fracture  POST-OPERATIVE DIAGNOSIS:  Right Ankle Fracture  PROCEDURE:  Procedure(s): EXTERNAL FIXATION LEG (Right) CLOSED REDUCTION ANKLE (Right)  SURGEON:  Surgeon(s) and Role:    * Samson FredericBrian Dontavis Tschantz, MD - Primary  PHYSICIAN ASSISTANT:   ASSISTANTS: carla bethune, RNFA   ANESTHESIA:   general  EBL:  Total I/O In: 1000 [I.V.:1000] Out: 20 [Blood:20]  BLOOD ADMINISTERED:none  DRAINS: none   LOCAL MEDICATIONS USED:  NONE  SPECIMEN:  No Specimen  DISPOSITION OF SPECIMEN:  N/A  COUNTS:  YES  TOURNIQUET:  * No tourniquets in log *  DICTATION: .Other Dictation: Dictation Number [id number]  PLAN OF CARE: Admit to inpatient   PATIENT DISPOSITION:  PACU - hemodynamically stable.   Delay start of Pharmacological VTE agent (>24hrs) due to surgical blood loss or risk of bleeding: no

## 2017-01-07 NOTE — Anesthesia Postprocedure Evaluation (Signed)
Anesthesia Post Note  Patient: Chloe [name]  Procedure(s) Performed: Procedure(s) (LRB): EXTERNAL FIXATION LEG (Right) CLOSED REDUCTION ANKLE (Right)  Patient location during evaluation: PACU Anesthesia Type: General Level of consciousness: awake and alert and patient cooperative Pain management: pain level controlled Vital Signs Assessment: post-procedure vital signs reviewed and stable Respiratory status: spontaneous breathing and respiratory function stable Cardiovascular status: stable Anesthetic complications: no       Last Vitals:  Vitals:   01/07/17 2045 01/07/17 2100  BP: 109/62 (!) 92/55  Pulse: 81 77  Resp: 14 14  Temp:      Last Pain:  Vitals:   01/07/17 2059  PainSc: 9                  Reese Stockman S

## 2017-01-08 ENCOUNTER — Encounter (HOSPITAL_COMMUNITY): Payer: Self-pay | Admitting: Orthopedic Surgery

## 2017-01-08 LAB — GLUCOSE, CAPILLARY
GLUCOSE-CAPILLARY: 103 mg/dL — AB (ref 65–99)
GLUCOSE-CAPILLARY: 128 mg/dL — AB (ref 65–99)
Glucose-Capillary: 118 mg/dL — ABNORMAL HIGH (ref 65–99)

## 2017-01-08 LAB — BASIC METABOLIC PANEL
ANION GAP: 11 (ref 5–15)
BUN: 22 mg/dL — ABNORMAL HIGH (ref 6–20)
CHLORIDE: 100 mmol/L — AB (ref 101–111)
CO2: 27 mmol/L (ref 22–32)
Calcium: 8.6 mg/dL — ABNORMAL LOW (ref 8.9–10.3)
Creatinine, Ser: 1.62 mg/dL — ABNORMAL HIGH (ref 0.44–1.00)
GFR calc non Af Amer: 32 mL/min — ABNORMAL LOW (ref >60)
GFR, EST AFRICAN AMERICAN: 37 mL/min — AB (ref >60)
GLUCOSE: 108 mg/dL — AB (ref 65–99)
POTASSIUM: 3 mmol/L — AB (ref 3.5–5.1)
Sodium: 138 mmol/L (ref 135–145)

## 2017-01-08 MED ORDER — ENOXAPARIN SODIUM 30 MG/0.3ML ~~LOC~~ SOLN: 30.0000 mg | SUBCUTANEOUS | 0 refills | Status: DC

## 2017-01-08 MED ORDER — DOCUSATE SODIUM 100 MG PO CAPS: 100.0000 mg | ORAL_CAPSULE | Freq: Two times a day (BID) | ORAL | 0 refills | Status: DC

## 2017-01-08 MED ORDER — HYDROCODONE-ACETAMINOPHEN 5-325 MG PO TABS: 1.0000 | ORAL_TABLET | ORAL | 0 refills | Status: DC | PRN

## 2017-01-08 MED ORDER — SENNA 8.6 MG PO TABS: 1.0000 | ORAL_TABLET | Freq: Two times a day (BID) | ORAL | 0 refills | Status: DC

## 2017-01-08 NOTE — Progress Notes (Signed)
Inpatient Diabetes Program Recommendations  AACE/ADA: New Consensus Statement on Inpatient Glycemic Control (2015)  Target Ranges:  Prepandial:   less than 140 mg/dL      Peak postprandial:   less than 180 mg/dL (1-2 hours)      Critically ill patients:  140 - 180 mg/dL   Results for Chloe [name], Chloe [name] (MRN [id number]) as of 01/08/2017 10:21  Ref. Range 01/07/2017 20:18 01/08/2017 07:00  Glucose-Capillary Latest Ref Range: 65 - 99 mg/dL 086105 (H) 578103 (H)   Review of Glycemic Control  Consult Glycemic Control while inpatient  Diabetes history: DM 2 Outpatient Diabetes medications: Lantus 40 units QAM, 10 units QPM, Humalog TID with meals Current orders for Inpatient glycemic control: Lantus 40 Daily, Novolog Resistant TID  Inpatient Diabetes Program Recommendations:   Glucose trend below 110 mg/dl. Patient received Lantus 40 this am. Patient is Day 1 Post Op and has diet ordered. Will be discharging to SNF for 1-2 weeks until her return for further surgery. Consider decreasing Novolog Correction to Sensitive (0-9 units) TID while inpatient. May need a 20% reduction in Lantus tomorrow depending on trends today.  Thanks,  Christena DeemShannon Magdala Brahmbhatt RN, MSN, [facility] Inpatient Diabetes Coordinator Team Pager [phone] (8a-5p)

## 2017-01-08 NOTE — Progress Notes (Signed)
   Subjective:  Patient reports pain as moderate to severe.  Denies N/V/CP/SOB. CT R ankle completed.  Objective:   VITALS:   Vitals:   01/07/17 2115 01/07/17 2144 01/08/17 0110 01/08/17 0700  BP: 101/76 114/67 116/71 114/62  Pulse: 77 77 74 76  Resp: 13 13 13 13   Temp:  98.4 F (36.9 C) 98.4 F (36.9 C) 98.2 F (36.8 C)  TempSrc:  Oral Oral Oral  SpO2: 96% 100% 100% 100%    NAD RLE: ex fix intact. Pin sites c/d/i. Mild ss drainage from medial ankle blisters. 1+ DP. Wiggles toes. Subjective sensory change SP, SILT DP/PT.  Lab Results  Component Value Date   WBC 8.2 01/07/2017   HGB 11.6 (L) 01/07/2017   HCT 36.0 01/07/2017   MCV 90.0 01/07/2017   PLT 158 01/07/2017   BMET    Component Value Date/Time   NA 138 01/08/2017 0402   K 3.0 (L) 01/08/2017 0402   CL 100 (L) 01/08/2017 0402   CO2 27 01/08/2017 0402   GLUCOSE 108 (H) 01/08/2017 0402   BUN 22 (H) 01/08/2017 0402   CREATININE 1.62 (H) 01/08/2017 0402   CALCIUM 8.6 (L) 01/08/2017 0402   GFRNONAA 32 (L) 01/08/2017 0402   GFRAA 37 (L) 01/08/2017 0402     Assessment/Plan: 1 Day Post-Op   Principal Problem:   Closed fracture dislocation of right ankle L fibula shaft fracture  Strict NWB RLE WBAT LLE Elevate toes above nose on blankets/pillows Ice DVT ppx: lovenox, SCDs, TEDs PO pain control PT/OT Dispo: needs SNF placement for now, will need definitive fixation in the future (1-2 weeks)   Verlie Liotta, Cloyde ReamsBrian James 01/08/2017, 9:08 AM   Samson FredericBrian Sy Saintjean, MD Cell [phone]

## 2017-01-08 NOTE — Evaluation (Signed)
Occupational Therapy Evaluation Patient Details Name: Chloe [name] MRN: [id number] DOB: 1946/10/23 Today's Date: 01/08/2017    History of Present Illness 71 y.o. female admitted to [facility] for surgery on 01/07/17 after falling at home on (01/04/17) injuring both of her lower legs.    She went to the ED where x-rays revealed L fibular shaft fx and right bimalleolar fx with subluxation of the tauls.  She was admitted to [facility] on 01/07/17 and underwent external fixation and closed reduction of her right ankle.  She is now NWB right and WBAT left.  She has significant PMHx of vertebral artery dissection, orthostatic hypotension, HTN, fibromyalgia, DM, chronic renal insufficiency, cervical spondylosis, bil TKA.     Clinical Impression   PTA Pt independent in ADL and mobility (used a SPC outside of the home). Pt currently max assist for ADL and Pt was able to stand with RW maintaining NWB on her right leg with two person assist.  She had some difficulty attempting to pivot to her left due to both weakness, body habitus, and pre morbid instability at her right knee (per pt and her daughter this knee gave out on her resulting in a fall).  Pt would benefit from SNF level rehab in the interim between this surgery and her next surgery to maximize independence and safety in ADL as well as educate on energy conservation and fall prevention. OT to follow acutely. Next session to focus on transfers and energy conservation education.    Follow Up Recommendations  SNF;Supervision/Assistance - 24 hour    Equipment Recommendations  3 in 1 bedside commode (BARIATRIC size essential due to body shape)    Recommendations for Other Services       Precautions / Restrictions Precautions Precautions: Fall Precaution Comments: history of falls Required Braces or Orthoses: Other Brace/Splint Other Brace/Splint: external fixator on RLE Restrictions Weight Bearing Restrictions: Yes RLE Weight Bearing: Non weight bearing       Mobility Bed Mobility Overal bed mobility: Needs Assistance Bed Mobility: Supine to Sit     Supine to sit: Min assist;HOB elevated     General bed mobility comments: HOB maximally elevated, pt using bed rails for leverage at trunk.  Pt needed very minimal assist to progress right leg to EOB.   Transfers Overall transfer level: Needs assistance Equipment used: Rolling walker (2 wheeled) Transfers: Sit to/from Stand Sit to Stand: +2 physical assistance;Mod assist         General transfer comment: Two person mod assist to support trunk, stabilize RW and block left knee (due to h/o instability at this knee).  Pt attempted to, but was unable to safely pivot to her left, so we moved the bed away and pulled the recliner chair up behind her.      Balance Overall balance assessment: Needs assistance Sitting-balance support: Feet supported;No upper extremity supported;Bilateral upper extremity supported;Single extremity supported Sitting balance-Leahy Scale: Good Sitting balance - Comments: supervision EOB.    Standing balance support: Bilateral upper extremity supported Standing balance-Leahy Scale: Poor Standing balance comment: needs assist from RW and external assit for balance.  She was able to maintain NWB right foot on her own power.                             ADL Overall ADL's : Needs assistance/impaired Eating/Feeding: Set up;Bed level;Sitting   Grooming: Wash/dry face;Oral care;Set up;Sitting;Bed level   Upper Body Bathing: Moderate assistance;Bed level  Lower Body Bathing: Total assistance;Bed level           Toilet Transfer: Moderate assistance;+2 for physical assistance;BSC (sit to stand) Toilet Transfer Details (indicate cue type and reason): requires bariatric equipment due to body shape           General ADL Comments: Pt encouraged to continue to do as much ADL as possible for herself     Vision     Perception     Praxis       Pertinent Vitals/Pain Pain Assessment: 0-10 Pain Score: 9  Pain Location: Right ankle Pain Descriptors / Indicators: Burning;Throbbing;Constant;Grimacing Pain Intervention(s): Limited activity within patient's tolerance;Repositioned;Monitored during session     Hand Dominance Right   Extremity/Trunk Assessment Upper Extremity Assessment Upper Extremity Assessment: Overall WFL for tasks assessed   Lower Extremity Assessment Lower Extremity Assessment: Defer to PT evaluation;RLE deficits/detail;LLE deficits/detail RLE Deficits / Details: left ankle is sore due to fibular fx, pt and daughter report that this is her "bad knee" as it gives out on her.  h/o TKA.  LLE Deficits / Details: Left leg pt is able to wiggle her toes, she is immobilized by x-fixator at her ankle, she is able to lift her right leg against gravity with the weight of the x-fixator on her lower leg.    Cervical / Trunk Assessment Cervical / Trunk Assessment: Other exceptions Cervical / Trunk Exceptions: h/o cervical spine issues   Communication Communication Communication: No difficulties   Cognition Arousal/Alertness: Awake/alert (a little chatty due to ) Behavior During Therapy: WFL for tasks assessed/performed Overall Cognitive Status: Within Functional Limits for tasks assessed                     General Comments       Exercises     Shoulder Instructions      Home Living Family/patient expects to be discharged to:: Private residence Living Arrangements: Alone Available Help at Discharge: Family;Available PRN/intermittently Type of Home: House Home Access: Stairs to enter Entergy CorporationEntrance Stairs-Number of Steps: 1   Home Layout: One level     Bathroom Shower/Tub: Producer, television/film/videoWalk-in shower   Bathroom Toilet: Handicapped height Bathroom Accessibility: Yes How Accessible: Accessible via walker (can go  in sideways) Home Equipment: Walker - standard;Walker - 2 wheels   Additional Comments: requires  bariatric because of body shape      Prior Functioning/Environment Level of Independence: Independent with assistive device(s)        Comments: used a SPC to ambulate outside of the house        OT Problem List: Decreased strength;Decreased range of motion;Decreased activity tolerance;Impaired balance (sitting and/or standing);Decreased safety awareness;Decreased knowledge of use of DME or AE;Decreased knowledge of precautions;Obesity;Pain   OT Treatment/Interventions: Self-care/ADL training;Energy conservation;DME and/or AE instruction;Therapeutic activities;Patient/family education;Balance training    OT Goals(Current goals can be found in the care plan section) Acute Rehab OT Goals Patient Stated Goal: to get more independent and stay mobile between her surgeries.  OT Goal Formulation: With patient Time For Goal Achievement: 01/22/17 Potential to Achieve Goals: Good ADL Goals Pt Will Transfer to Toilet: with mod assist;stand pivot transfer;bedside commode (bariatric ) Pt Will Perform Toileting - Clothing Manipulation and hygiene: with supervision;sit to/from stand Additional ADL Goal #1: Pt will recall 3 ways of conserving energy during ADL  OT Frequency: Min 2X/week   Barriers to D/C: Decreased caregiver support  Pt lives alone       Co-evaluation PT/OT/SLP Co-Evaluation/Treatment: Yes Reason for Co-Treatment:  Complexity of the patient's impairments (multi-system involvement);For patient/therapist safety;To address functional/ADL transfers PT goals addressed during session: Mobility/safety with mobility OT goals addressed during session: ADL's and self-care      End of Session Equipment Utilized During Treatment: Gait belt;Rolling walker Nurse Communication: Mobility status;Other (comment);Weight bearing status (how to transfer back to bed-sit to stand and move chair/bed)  Activity Tolerance: Patient tolerated treatment well Patient left: in chair;with call bell/phone  within reach;with family/visitor present   Time: 6962-9528 OT Time Calculation (min): 45 min Charges:  OT General Charges $OT Visit: 1 Procedure OT Evaluation $OT Eval Moderate Complexity: 1 Procedure OT Treatments $Self Care/Home Management : 8-22 mins G-Codes:    Evern Bio Abie Killian 01/20/17, 6:12 PM  Sherryl Manges OTR/L [phone]

## 2017-01-08 NOTE — Op Note (Signed)
NAME:  Chloe [name], Chloe [name] NO.:  [id number]  MEDICAL RECORD NO.:  [id number]  LOCATION:  [facility]                         FACILITY:  [facility]  PHYSICIAN:  Samson Frederic, MD     DATE OF BIRTH:  11-22-1946  DATE OF PROCEDURE:  01/07/2017 DATE OF DISCHARGE:                              OPERATIVE REPORT   SURGEON:  Samson Frederic, MD  ASSISTANT:  Patrick Jupiter, RNFA.  PREOPERATIVE DIAGNOSIS:  Right trimalleolar equivalent ankle fracture with greater than 50% subluxation laterally of the talus.  POSTOPERATIVE DIAGNOSIS:  Right trimalleolar equivalent ankle fracture with greater than 50% subluxation laterally of the talus.  PROCEDURE PERFORMED:  Closed reduction of right ankle with placement of external fixator.  ANESTHESIA:  General.  EBL:  Minimal.  ANTIBIOTICS:  2 g of Ancef.  COMPLICATIONS:  None.  TUBES AND DRAINS:  None.  DISPOSITION:  Stable to PACU.  INDICATIONS:  The patient is a 71 year old female, who tripped and fell at home on January 04, 2017.  She was seen at a local emergency department where x-rays revealed a left fibular shaft fracture as well as a right trimalleolar equivalent ankle fracture with greater than 50% lateral subluxation of the talus.  She was apparently attempted close reduction.  She was then splinted.  I saw her in the office today.  She was found to have persistent lateral subluxation of the talus.  She also had impending skin breakdown over the medial aspect of the ankle with large fracture blisters present.  I unroofed the fracture blisters.  I attempted to reduce her in the office.  This was unsuccessful.  I then direct admitted her to [facility], took her to the operating room for urgent reduction and external fixator placement.  Risks, benefits, and alternatives were explained.  She elected to proceed.  She understands she is going to need delayed definitive fixation of the ankle in the future.  DESCRIPTION OF  PROCEDURE IN DETAIL:  I identified the patient in the holding area using 2 identifiers.  Surgical site was marked by myself. She was taken to the operating room.  General anesthesia was induced. She was positioned on the table.  Bony prominences were well padded. The right lower extremity was prepped and draped in a normal sterile surgical fashion.  Time-out was called verifying side and site of surgery.  I began by using fluoroscopy to define her anatomy.  I placed 2 pins in the anteromedial crest of the tibia about a handbreadth distal to the tibial tubercle.  This was done under fluoroscopic control.  I then placed a transfixing calcaneal pin from medial to lateral in the inferior-posterior quadrant of the calcaneus.  Pin site was checked fluoroscopically.  I then assembled the external fixator.  I reduced the fracture.  All the clamps were tightened.  I then placed an extra bar to bar connector to stiffen the construct.  Final AP, mortise, and lateral views of the ankle were obtained confirming successful reduction of the talus, overall acceptable alignment.  The pin sites were dressed, the fracture blisters were dressed with Xeroform, 4x4s, and an ACE wrap. She was then awakened from anesthesia, taken to PACU  in a stable condition.  Sponge, needle, and instrument counts were correct at the end of the case x2.  There were no complications.  Postoperatively, we will admit her to the hospital.  She will be non- weight bearing.  We will elevate her toes above nose.  Lovenox for DVT prophylaxis.  Physical and occupational therapy.  She will need disposition planning.  I will see her in the office 1 week after discharge for planning her surgical fixation.          ______________________________ Samson FredericBrian Satya Bohall, MD     BS/MEDQ  D:  01/07/2017  T:  01/08/2017  Job:  [id number]

## 2017-01-08 NOTE — Evaluation (Signed)
Physical Therapy Evaluation Patient Details Name: Chloe [name] MRN: [id number] DOB: January 15, 1946 Today's Date: 01/08/2017   History of Present Illness  71 y.o. female admitted to [facility] for surgery on 01/07/17 after falling at home on (01/04/17) injuring both of her lower legs.    She went to the ED where x-rays revealed L fibular shaft fx and right bimalleolar fx with subluxation of the tauls.  She was admitted to [facility] on 01/07/17 and underwent external fixation and closed reduction of her right ankle.  She is now NWB right and WBAT left.  She has significant PMHx of vertebral artery dissection, orthostatic hypotension, HTN, fibromyalgia, DM, chronic renal insufficiency, cervical spondylosis, bil TKA.    Clinical Impression  Pt was able to stand with RW maintaining NWB on her right leg with two person assist.  She had some difficulty attempting to pivot to her left due to both weakness, body habitus, and pre morbid instability at her right knee (per pt and her daughter this knee gave out on her resulting in a fall).  Pt would benefit from SNF level rehab in the interim between this surgery and her next surgery.   PT to follow acutely for deficits listed below.       Follow Up Recommendations SNF    Equipment Recommendations  Rolling walker with 5" wheels;Wheelchair (measurements PT);Wheelchair cushion (measurements PT);3in1 (PT)    Recommendations for Other Services   NA     Precautions / Restrictions Precautions Precautions: Fall Precaution Comments: history of falls Required Braces or Orthoses: Other Brace/Splint Other Brace/Splint: external fixator on RLE      Mobility  Bed Mobility Overal bed mobility: Needs Assistance Bed Mobility: Supine to Sit     Supine to sit: Min assist;HOB elevated     General bed mobility comments: HOB maximally elevated, pt using bed rails for leverage at trunk.  Pt needed very minimal assist to progress right leg to EOB.   Transfers Overall transfer  level: Needs assistance Equipment used: Rolling walker (2 wheeled) Transfers: Sit to/from Stand Sit to Stand: +2 physical assistance;Mod assist         General transfer comment: Two person mod assist to support trunk, stabilize RW and block left knee (due to h/o instability at this knee).  Pt attempted to, but was unable to safely pivot to her left, so we moved the bed away and pulled the recliner chair up behind her.    Ambulation/Gait             General Gait Details: unable at this time.          Balance Overall balance assessment: Needs assistance Sitting-balance support: Feet supported;No upper extremity supported;Bilateral upper extremity supported;Single extremity supported Sitting balance-Leahy Scale: Good Sitting balance - Comments: supervision EOB.    Standing balance support: Bilateral upper extremity supported Standing balance-Leahy Scale: Poor Standing balance comment: needs assist from RW and external assit for balance.  She was able to maintain NWB right foot on her own power.                              Pertinent Vitals/Pain Pain Assessment: 0-10 Pain Score: 9  Pain Location: Right ankle Pain Descriptors / Indicators: Burning;Throbbing;Constant;Grimacing Pain Intervention(s): Limited activity within patient's tolerance;Repositioned    Home Living Family/patient expects to be discharged to:: Private residence Living Arrangements: Alone Available Help at Discharge: Family;Available PRN/intermittently Type of Home: House Home Access: Stairs to  enter   Entrance Stairs-Number of Steps: 1 Home Layout: One level Home Equipment: Scientist, forensicWalker - standard;Walker - 2 wheels Additional Comments: requires bariatric because of body shape    Prior Function Level of Independence: Independent with assistive device(s)         Comments: used a SPC to ambulate outside of the house     Hand Dominance   Dominant Hand: Right    Extremity/Trunk  Assessment   Upper Extremity Assessment Upper Extremity Assessment: Defer to OT evaluation    Lower Extremity Assessment Lower Extremity Assessment: RLE deficits/detail;LLE deficits/detail RLE Deficits / Details: left ankle is sore due to fibular fx, pt and daughter report that this is her "bad knee" as it gives out on her.  h/o TKA.  LLE Deficits / Details: Left leg pt is able to wiggle her toes, she is immobilized by x-fixator at her ankle, she is able to lift her right leg against gravity with the weight of the x-fixator on her lower leg.     Cervical / Trunk Assessment Cervical / Trunk Assessment: Other exceptions Cervical / Trunk Exceptions: h/o cervical spine issues  Communication   Communication: No difficulties  Cognition Arousal/Alertness: Awake/alert (a little chatty due to ) Behavior During Therapy: WFL for tasks assessed/performed Overall Cognitive Status: Within Functional Limits for tasks assessed                         Exercises Total Joint Exercises Ankle Circles/Pumps: AROM;Left;20 reps;Other (comment) (toe wiggles on the right) Other Exercises Other Exercises: IS x 3 reps, verbal cues for correct technique, pt able to fill the entire IS to the top.    Assessment/Plan    PT Assessment Patient needs continued PT services  PT Problem List Decreased strength;Decreased range of motion;Decreased activity tolerance;Decreased balance;Decreased mobility;Decreased knowledge of use of DME;Obesity;Pain;Decreased knowledge of precautions          PT Treatment Interventions DME instruction;Gait training;Stair training;Therapeutic activities;Functional mobility training;Therapeutic exercise;Balance training;Patient/family education;Modalities    PT Goals (Current goals can be found in the Care Plan section)  Acute Rehab PT Goals Patient Stated Goal: to get more independent and stay mobile between her surgeries.  PT Goal Formulation: With patient/family Time For  Goal Achievement: 01/22/17 Potential to Achieve Goals: Good    Frequency Min 3X/week        Co-evaluation PT/OT/SLP Co-Evaluation/Treatment: Yes Reason for Co-Treatment: Complexity of the patient's impairments (multi-system involvement);For patient/therapist safety;To address functional/ADL transfers PT goals addressed during session: Mobility/safety with mobility;Balance;Proper use of DME;Strengthening/ROM         End of Session Equipment Utilized During Treatment: Gait belt Activity Tolerance: Patient limited by pain;Patient limited by fatigue Patient left: in chair;with call bell/phone within reach;with family/visitor present Nurse Communication: Mobility status;Other (comment) (switch the bed and recliner when going back to bed vs pivot)         Time: 7829-56211606-1635 PT Time Calculation (min) (ACUTE ONLY): 29 min   Charges:   PT Evaluation $PT Eval Moderate Complexity: 1 Procedure          Aldo Sondgeroth B. Janai Maudlin, PT, DPT [phone]   01/08/2017, 5:09 PM

## 2017-01-09 ENCOUNTER — Encounter (HOSPITAL_COMMUNITY): Payer: Self-pay

## 2017-01-09 LAB — BASIC METABOLIC PANEL
ANION GAP: 6 (ref 5–15)
BUN: 18 mg/dL (ref 6–20)
CHLORIDE: 101 mmol/L (ref 101–111)
CO2: 32 mmol/L (ref 22–32)
CREATININE: 1.44 mg/dL — AB (ref 0.44–1.00)
Calcium: 9 mg/dL (ref 8.9–10.3)
GFR calc non Af Amer: 36 mL/min — ABNORMAL LOW (ref >60)
GFR, EST AFRICAN AMERICAN: 42 mL/min — AB (ref >60)
Glucose, Bld: 102 mg/dL — ABNORMAL HIGH (ref 65–99)
Potassium: 3.8 mmol/L (ref 3.5–5.1)
Sodium: 139 mmol/L (ref 135–145)

## 2017-01-09 LAB — GLUCOSE, CAPILLARY
GLUCOSE-CAPILLARY: 120 mg/dL — AB (ref 65–99)
Glucose-Capillary: 105 mg/dL — ABNORMAL HIGH (ref 65–99)
Glucose-Capillary: 93 mg/dL (ref 65–99)
Glucose-Capillary: 97 mg/dL (ref 65–99)

## 2017-01-09 NOTE — Progress Notes (Signed)
Physical Therapy Treatment Patient Details Name: Chloe [name] MRN: [id number] DOB: 1946-02-22 Today's Date: 01/09/2017    History of Present Illness 71 y.o. female admitted to [facility] for surgery on 01/07/17 after falling at home on (01/04/17) injuring both of her lower legs.    She went to the ED where x-rays revealed L fibular shaft fx and right bimalleolar fx with subluxation of the tauls.  She was admitted to [facility] on 01/07/17 and underwent external fixation and closed reduction of her right ankle.  She is now NWB right and WBAT left.  She has significant PMHx of vertebral artery dissection, orthostatic hypotension, HTN, fibromyalgia, DM, chronic renal insufficiency, cervical spondylosis, bil TKA.      PT Comments    Pt was better able to stand assisted today.  She still does not feel confident enough to pivot with RW on her left "bad" knee.  She had a BM while we were up on the [facility] and I think we were better able to elevate her leg in the recliner chair.  She continues to be appropriate for SNF level rehab between her surgeries.   Follow Up Recommendations  SNF     Equipment Recommendations  Rolling walker with 5" wheels;Wheelchair (measurements PT);Wheelchair cushion (measurements PT);3in1 (PT)    Recommendations for Other Services   NA     Precautions / Restrictions Precautions Precautions: Fall Precaution Comments: history of falls and pt reports left knee buckles  Restrictions RLE Weight Bearing: Non weight bearing    Mobility  Bed Mobility Overal bed mobility: Modified Independent Bed Mobility: Supine to Sit     Supine to sit: Modified independent (Device/Increase time);HOB elevated     General bed mobility comments: Pt able to work her way to EOB on her own power, lifting her R ex fix against gravity.  HOB was elevated and she did rely on rails for assit.   Transfers Overall transfer level: Needs assistance Equipment used: Rolling walker (2 wheeled)   Sit to Stand: +2  physical assistance;Min assist;From elevated surface         General transfer comment: Two person min assist x 1 and one person min assist x 1 from elevated bed and elevated BSC.  Pt stood with RW and bed was moved, [facility] was put behind her and she sat, then she stood from [facility] after toileting with one person min assist (total assist peri care) and second person moved [facility] away and put recliner chair behind her.    Ambulation/Gait             General Gait Details: Unable at this time as she doesn't have the strength to pivot yet on her "bad" left knee and foot.           Balance   Sitting-balance support: Feet supported;No upper extremity supported Sitting balance-Leahy Scale: Good     Standing balance support: Bilateral upper extremity supported Standing balance-Leahy Scale: Poor                      Cognition Arousal/Alertness: Awake/alert Behavior During Therapy: WFL for tasks assessed/performed Overall Cognitive Status: Within Functional Limits for tasks assessed                 General Comments: Less chatty today and per daughter more clear headed.     Exercises Total Joint Exercises Ankle Circles/Pumps: AROM;Left;20 reps;Other (comment) (toe wiggles right 20 reps) Short Arc Quad: AROM;Right;Left;10 reps Heel Slides: AROM;AAROM;Left;Right;10 reps Hip ABduction/ADduction:  AROM;Both;10 reps Straight Leg Raises: AAROM;Both;10 reps    General Comments General comments (skin integrity, edema, etc.): I elevated her right leg in recliner chair as much as I could given the space and got her nose pretty close to the same level as her toes.       Pertinent Vitals/Pain Pain Assessment: 0-10 Pain Score: 7  Pain Location: right lower leg, sore left ankle, sore right shoulder            PT Goals (current goals can now be found in the care plan section) Acute Rehab PT Goals Patient Stated Goal: to get more independent and stay mobile between her surgeries.   Progress towards PT goals: Progressing toward goals    Frequency    Min 3X/week      PT Plan Current plan remains appropriate       End of Session Equipment Utilized During Treatment: Gait belt Activity Tolerance: Patient limited by fatigue;Patient limited by pain Patient left: in chair;with call bell/phone within reach     Time: [id number] PT Time Calculation (min) (ACUTE ONLY): 50 min  Charges:  $Therapeutic Exercise: 8-22 mins $Therapeutic Activity: 23-37 mins                      Surabhi Gadea B. Amoy Steeves, PT, DPT [phone]   01/09/2017, 3:12 PM

## 2017-01-09 NOTE — Clinical Social Work Note (Signed)
Clinical Social Work Assessment  Patient Details  Name: Chloe [name] MRN: [id number] Date of Birth: 08/02/1946  Date of referral:  01/09/17               Reason for consult:  Facility Placement                Permission sought to share information with:  Family Supports Permission granted to share information::  Yes, Verbal Permission Granted  Name::     Sonja  Agency::     Relationship::  Daughter  Contact Information:  [phone]  Housing/Transportation Living arrangements for the past 2 months:  Skilled Nursing Facility Source of Information:  Patient Patient Interpreter Needed:  None Criminal Activity/Legal Involvement Pertinent to Current Situation/Hospitalization:  No - Comment as needed Significant Relationships:  Adult Children Lives with:  Self Do you feel safe going back to the place where you live?  Yes Need for family participation in patient care:     Care giving concerns:  Pt's daughter present at bedside during initial assessment.    Social Worker assessment / plan:  CSW spoke with pt at bedside to complete initial assessment. Pt lives home alone. Pt's daughters serve as support. Pt is agreeable to SNF placement at d/c. Pt is agreeable to CSW sending referral to [place] facilities as well as any close to [facility]. CSW will send referral and follow up with pt and pt's daughter at the end of the day.  Employment status:  Retired Database administratornsurance information:  Managed Medicare PT Recommendations:  Skilled Nursing Facility Information / Referral to community resources:  Skilled Nursing Facility  Patient/Family's Response to care:  Pt verbalized understanding CSW role and expressed appreciation for support. Pt denies any concern regarding pt care at this time.   Patient/Family's Understanding of and Emotional Response to Diagnosis, Current Treatment, and Prognosis:  Pt understanding and realistic regarding physical limitations. Pt understands the need for SNF  placement at d/c. Pt is agreeable to SNF placement at this time. Pt denies any concern regarding treatment plan at this time. Pt engaged in conversation emotionally appropriate. CSW will continue to provide support.  Emotional Assessment Appearance:  Appears stated age Attitude/Demeanor/Rapport:   (Patient was appropriate.) Affect (typically observed):  Accepting, Appropriate, Calm Orientation:  Oriented to Self, Oriented to Place, Oriented to  Time, Oriented to Situation Alcohol / Substance use:  Not Applicable Psych involvement (Current and /or in the community):  No (Comment)  Discharge Needs  Concerns to be addressed:  No discharge needs identified Readmission within the last 30 days:  No Current discharge risk:  Dependent with Mobility Barriers to Discharge:  Continued Medical Work up   [employer] IncBridget A Mayton, LCSW 01/09/2017, 2:15 PM

## 2017-01-09 NOTE — Progress Notes (Signed)
[facility] has offered a bed to pt and they will accept pt in the am.  Illene SilverJody Savannah Erbe,LCSW Evening/ED CSW [phone]

## 2017-01-09 NOTE — Discharge Summary (Signed)
Physician Discharge Summary  Patient ID: Chloe [name] MRN: [id number] DOB/AGE: [birth year] 71 y.o.  Admit date: 01/07/2017 Discharge date: 01/09/2017  Admission Diagnoses:  Closed fracture dislocation of right ankle  Discharge Diagnoses:  Principal Problem:   Closed fracture dislocation of right ankle   Past Medical History:  Diagnosis Date  . Cervical spondylosis without myelopathy 08/10/2015  . Chronic renal insufficiency   . Diabetes (HCC)   . Endometriosis   . Fibromyalgia   . Hypertension   . Obesity   . Occipital headache   . Orthostatic hypotension 02/02/2015  . Plantar fasciitis   . Shingles   . Vertebral artery dissection (HCC)    right     Surgeries: Procedure(s): EXTERNAL FIXATION LEG CLOSED REDUCTION ANKLE on 01/07/2017   Consultants (if any):   Discharged Condition: Improved  Hospital Course: Chloe [name] is an 71 y.o. female who was admitted 01/07/2017 with a diagnosis of Closed fracture dislocation of right ankle and went to the operating room on 01/07/2017 and underwent the above named procedures.    She was given perioperative antibiotics:  Anti-infectives    Start     Dose/Rate Route Frequency Ordered Stop   01/08/17 0100  ceFAZolin (ANCEF) IVPB 2g/100 mL premix     2 g 200 mL/hr over 30 Minutes Intravenous Every 6 hours 01/07/17 2136 01/08/17 1237   01/07/17 1859  ceFAZolin (ANCEF) 1 GM/50ML IVPB    Comments:  Toney Sang   : cabinet override      01/07/17 1859 01/08/17 0714   01/07/17 1859  ceFAZolin (ANCEF) 2-4 GM/100ML-% IVPB    Comments:  Toney Sang   : cabinet override      01/07/17 1859 01/08/17 0714   01/07/17 1715  ceFAZolin (ANCEF) 3 g in dextrose 5 % 50 mL IVPB     3 g 130 mL/hr over 30 Minutes Intravenous To [facility] 01/07/17 1707 01/07/17 1900    .  She was given sequential compression devices and Lovenox for DVT prophylaxis.  She benefited maximally from the hospital stay and there were no complications.     Recent vital signs:  Vitals:   01/09/17 0452 01/09/17 1500  BP: (!) 106/51 114/60  Pulse: 65 65  Resp: 16 17  Temp: 98 F (36.7 C) 99.3 F (37.4 C)    Recent laboratory studies:  Lab Results  Component Value Date   HGB 11.6 (L) 01/07/2017   HGB 8.3 (L) 06/14/2007   HGB 8.1 (L) 06/13/2007   Lab Results  Component Value Date   WBC 8.2 01/07/2017   PLT 158 01/07/2017   Lab Results  Component Value Date   INR 1.8 (H) 06/15/2007   Lab Results  Component Value Date   NA 139 01/09/2017   K 3.8 01/09/2017   CL 101 01/09/2017   CO2 32 01/09/2017   BUN 18 01/09/2017   CREATININE 1.44 (H) 01/09/2017   GLUCOSE 102 (H) 01/09/2017    Discharge Medications:   Allergies as of 01/09/2017      Reactions   Adhesive [tape] Hives, Rash   Codeine Nausea Only      Medication List    STOP taking these medications   traMADol 50 MG tablet Commonly known as:  ULTRAM     TAKE these medications   aspirin 81 MG tablet Take 81 mg by mouth daily.   BIOTIN PO Take by mouth.   CALCIUM 500 PO Take 500 mg by mouth 2 (two) times daily.  Cinnamon 500 MG Tabs Take 500 mg by mouth 2 (two) times daily.   COCONUT OIL PO Take 2 capsules by mouth daily.   dicyclomine 20 MG tablet Commonly known as:  BENTYL Take 20 mg by mouth 3 (three) times daily before meals.   docusate sodium 100 MG capsule Commonly known as:  COLACE Take 1 capsule (100 mg total) by mouth 2 (two) times daily.   DULoxetine 60 MG capsule Commonly known as:  CYMBALTA Take 1 capsule (60 mg total) by mouth 2 (two) times daily.   enoxaparin 30 MG/0.3ML injection Commonly known as:  LOVENOX Inject 0.3 mLs (30 mg total) into the skin daily.   FIBER PO Take 2 tablets by mouth daily.   Fish Oil 1200 MG Caps Take 1,200 mg by mouth 3 (three) times daily.   furosemide 40 MG tablet Commonly known as:  LASIX Take 40 mg by mouth. 2 in the morning and 1 at 4:00   gabapentin 400 MG capsule Commonly known as:   NEURONTIN Take 2 caps in the morning, supper time and  Night . Take 3 caps at noon.   HUMALOG  Inject into the skin. 2-3 times daily   HYDROcodone-acetaminophen 5-325 MG tablet Commonly known as:  NORCO/VICODIN Take 1-2 tablets by mouth every 4 (four) hours as needed (breakthrough pain).   LANTUS 100 UNIT/ML injection Generic drug:  insulin glargine Inject 100 Units into the skin at bedtime. 40 units in the am and 10 in the pm   PRILOSEC 20 MG capsule Generic drug:  omeprazole Take 20 mg by mouth 2 (two) times daily.   ramipril 10 MG capsule Commonly known as:  ALTACE Take 10 mg by mouth daily. Two tabs daily   rosuvastatin 5 MG tablet Commonly known as:  CRESTOR Take 5 mg by mouth daily.   senna 8.6 MG Tabs tablet Commonly known as:  SENOKOT Take 1 tablet (8.6 mg total) by mouth 2 (two) times daily.   TOPROL XL 25 MG 24 hr tablet Generic drug:  metoprolol succinate Take 25 mg by mouth 2 (two) times daily.   TURMERIC PO Take by mouth 2 (two) times daily.   UNABLE TO FIND Take 1 tablet by mouth daily. Med Name: stool softner   Vitamin D-3 1000 units Caps Take 1,000 Units by mouth 3 (three) times daily.   vitamin E 400 UNIT capsule Take 400 Units by mouth daily.   zolpidem 10 MG tablet Commonly known as:  AMBIEN Take 1 tablet (10 mg total) by mouth at bedtime as needed. for sleep       Diagnostic Studies: Dg Ankle Complete Right  Result Date: 01/07/2017 CLINICAL DATA:  71 year old female with severe right ankle fracture. External fixator placement. Initial encounter. EXAM: RIGHT ANKLE - COMPLETE 3+ VIEW; DG C-ARM 61-120 MIN COMPARISON:  01/04/2017. FLUOROSCOPY TIME:  1 minute 14 seconds FINDINGS: Four intraoperative images demonstrate external fixator placement about the right ankle fracture with improved mortise joint alignment. IMPRESSION: Partially visible right lower extremity external fixator. Improved right mortise joint alignment. Electronically Signed    By: Odessa Fleming M.D.   On: 01/07/2017 20:56   Ct Ankle Right Wo Contrast  Result Date: 01/07/2017 CLINICAL DATA:  ORIF of right ankle fracture EXAM: CT OF THE RIGHT ANKLE WITHOUT CONTRAST TECHNIQUE: Multidetector CT imaging of the right ankle was performed according to the standard protocol. Multiplanar CT image reconstructions were also generated. COMPARISON:  Preoperative radiographs from 01/04/2017 of the right ankle. FINDINGS: Bones/Joint/Cartilage Comminuted  fracture of the distal fibular shaft involving the distal diaphysis and the diametaphysis of the fibula. There is 1/4 shaft with lateral and 1/2 shaft with dorsal displacement at the site of proximal fibular fracture. A 1 cm in length dorsal butterfly fragment off the dorsal aspect of the fibula is seen at the site of proximal fracture. This too is slightly displaced dorsally by approximately 3 mm. Posterior malleolar fracture with 3 mm of posterior displacement is seen off the distal tibia. The medial malleolus which was approximately 17 mm subluxed in the medial direction on plain films has been relocated. The medial aspect of the ankle mortise is now approximately 6 mm as seen on image 41/11. Tiny flakes of bone are seen within the ankle joint laterally between the talus and distal fibula, image 38/11 measuring 5 x 2 mm, at least 3 adjacent to the anterolateral corner of the ankle joint the largest component measuring approximately 7 x 2 mm, image 39/11. A few scattered fracture fragments are seen adjacent to the lateral aspect of the talar dome the largest component measuring 7 mm in length, image 45/11. Ligaments Suboptimally assessed by CT. Muscles and Tendons Posttraumatic edema and induration of the soft tissues and surrounding musculature. Tendons are difficult to definitively follow up. No definite rupture. Soft tissues Diffuse soft tissue swelling and edema postop and posttraumatic in etiology. No hematoma or abnormal fluid collections. Fixation  screw traverses the posterior calcaneus transversely. IMPRESSION: Reduction of fracture dislocation of the right ankle with decrease in widening of the medial aspect of the ankle mortise now demonstrated. A few tiny bony fragments are seen in and about the ankle joint. Comminuted fracture of the distal fibula with minimal displacement, but improved since preoperative imaging. Nondisplaced posterior malleolar fracture. Electronically Signed   By: Tollie Ethavid  Kwon M.D.   On: 01/07/2017 23:28   Dg Ankle Right Port  Result Date: 01/07/2017 CLINICAL DATA:  71 year old female status post external fixator placement about right lower extremity fracture. Initial encounter. EXAM: PORTABLE RIGHT ANKLE - 2 VIEW COMPARISON:  Intraoperative images from 1940 hours today reported separately. Right ankle series [id number]. FINDINGS: Portable AP and cross-table lateral views of the distal right lower extremity demonstrate an external fixation device anchored proximally to the right tibial shaft and distally to the calcaneus. Improved and near anatomic alignment about the right mortise joint following trimalleolar type right ankle fracture (fibula fracture located at the distal shaft). The hardware appears intact. No new osseous abnormality identified. IMPRESSION: No adverse features status post right lower extremity external fixator device placement. Improved and near anatomic alignment about the right ankle. Electronically Signed   By: Odessa FlemingH  Hall M.D.   On: 01/07/2017 20:55   Dg C-arm 1-60 Min  Result Date: 01/07/2017 CLINICAL DATA:  71 year old female with severe right ankle fracture. External fixator placement. Initial encounter. EXAM: RIGHT ANKLE - COMPLETE 3+ VIEW; DG C-ARM 61-120 MIN COMPARISON:  01/04/2017. FLUOROSCOPY TIME:  1 minute 14 seconds FINDINGS: Four intraoperative images demonstrate external fixator placement about the right ankle fracture with improved mortise joint alignment. IMPRESSION: Partially visible right lower  extremity external fixator. Improved right mortise joint alignment. Electronically Signed   By: Odessa FlemingH  Hall M.D.   On: 01/07/2017 20:56    Disposition:   Discharge Instructions    Call MD / Call 911    Complete by:  As directed    If you experience chest pain or shortness of breath, CALL 911 and be transported to the hospital emergency room.  If you develope a fever above 101 F, pus (white drainage) or increased drainage or redness at the wound, or calf pain, call your surgeon's office.   Constipation Prevention    Complete by:  As directed    Drink plenty of fluids.  Prune juice may be helpful.  You may use a stool softener, such as Colace (over the counter) 100 mg twice a day.  Use MiraLax (over the counter) for constipation as needed.   Diet - low sodium heart healthy    Complete by:  As directed    Discharge instructions    Complete by:  As directed    Strict nonweightbearing right lower extremity. Weightbearing as tolerated left lower extremity. Elevate right ankle, toes above nose on blankets or pillows. Change dressing to right ankle as needed with Xeroform, 4 x 4's, ABDs, Ace wrap. Check basic metabolic panel weekly while on Lovenox.   Increase activity slowly as tolerated    Complete by:  As directed       Follow-up Information    Michole Lecuyer, Cloyde Reams, MD. Schedule an appointment as soon as possible for a visit in 1 week.   Specialty:  Orthopedic Surgery Why:  For wound re-check Contact information: [address] [place] [address] [phone]            Signed: Garnet Koyanagi 01/09/2017, 4:59 PM

## 2017-01-09 NOTE — Clinical Social Work Placement (Signed)
   CLINICAL SOCIAL WORK PLACEMENT  NOTE  Date:  01/09/2017  Patient Details  Name: Kem KaysSANTA M Rogness MRN: [id number] Date of Birth: 10/15/1946  Clinical Social Work is seeking post-discharge placement for this patient at the Skilled  Nursing Facility level of care (*CSW will initial, date and re-position this form in  chart as items are completed):      Patient/family provided with [facility] Clinical Social Work Department's list of facilities offering this level of care within the geographic area requested by the patient (or if unable, by the patient's family).      Patient/family informed of their freedom to choose among providers that offer the needed level of care, that participate in Medicare, Medicaid or managed care program needed by the patient, have an available bed and are willing to accept the patient.      Patient/family informed of [facility]'s ownership interest in [facility] and [facility], as well as of the fact that they are under no obligation to receive care at these facilities.  PASRR submitted to EDS on       PASRR number received on 01/09/17     Existing PASRR number confirmed on       FL2 transmitted to all facilities in geographic area requested by pt/family on 01/09/17     FL2 transmitted to all facilities within larger geographic area on       Patient informed that his/her managed care company has contracts with or will negotiate with certain facilities, including the following:        Yes   Patient/family informed of bed offers received.  Patient chooses bed at       Physician recommends and patient chooses bed at      Patient to be transferred to   on  .  Patient to be transferred to facility by       Patient family notified on   of transfer.  Name of family member notified:        PHYSICIAN Please prepare priority discharge summary, including medications, Please prepare prescriptions, Please sign FL2     Additional Comment:     _______________________________________________ Volney AmericanBridget A Mayton, LCSW 01/09/2017, 2:25 PM

## 2017-01-09 NOTE — Progress Notes (Signed)
   Subjective:  Patient reports pain as mild to moderate.  Denies N/V/CP/SOB.   Objective:   VITALS:   Vitals:   01/08/17 1529 01/08/17 2116 01/08/17 2202 01/09/17 0452  BP: (!) 95/48 (!) 116/59 114/60 (!) 106/51  Pulse: 62 67 66 65  Resp: 16  16 16   Temp: 98.2 F (36.8 C)  98.2 F (36.8 C) 98 F (36.7 C)  TempSrc:   Oral Oral  SpO2: 98%  98% 98%    NAD RLE: ex fix intact. Pin sites c/d/i. Mild ss drainage from medial ankle blisters. 1+ DP. Wiggles toes. Subjective sensory change SP, SILT DP/PT.  Lab Results  Component Value Date   WBC 8.2 01/07/2017   HGB 11.6 (L) 01/07/2017   HCT 36.0 01/07/2017   MCV 90.0 01/07/2017   PLT 158 01/07/2017   BMET    Component Value Date/Time   NA 139 01/09/2017 0347   K 3.8 01/09/2017 0347   CL 101 01/09/2017 0347   CO2 32 01/09/2017 0347   GLUCOSE 102 (H) 01/09/2017 0347   BUN 18 01/09/2017 0347   CREATININE 1.44 (H) 01/09/2017 0347   CALCIUM 9.0 01/09/2017 0347   GFRNONAA 36 (L) 01/09/2017 0347   GFRAA 42 (L) 01/09/2017 0347     Assessment/Plan: 2 Days Post-Op   Principal Problem:   Closed fracture dislocation of right ankle L fibula shaft fracture  Strict NWB RLE WBAT LLE Elevate toes above nose on blankets/pillows Ice DVT ppx: lovenox, SCDs, TEDs PO pain control PT/OT Dispo: D/C to SNF for now, will need definitive fixation in the future (1-2 weeks)   Chloe [name], Chloe [name] 01/09/2017, 10:18 AM   Chloe FredericBrian Tiauna Whisnant, MD Cell [phone]

## 2017-01-09 NOTE — NC FL2 (Signed)
[place] MEDICAID FL2 LEVEL OF CARE SCREENING TOOL     IDENTIFICATION  Patient Name: Chloe [name] Birthdate: 1946/02/24 Sex: female Admission Date (Current Location): 01/07/2017  [facility] and [place] Number:  Producer, television/film/video and Address:  [facility], [address] [place], [place] [address]      Provider Number: [id number]  Attending Physician Name and Address:  Samson Frederic, MD  Relative Name and Phone Number:       Current Level of Care: Hospital Recommended Level of Care: Skilled Nursing Facility Prior Approval Number:    Date Approved/Denied: 01/09/17 PASRR Number: [id number]  Discharge Plan: SNF    Current Diagnoses: Patient Active Problem List   Diagnosis Date Noted  . Closed fracture dislocation of right ankle 01/07/2017  . Cervical spondylosis without myelopathy 08/10/2015  . Orthostatic hypotension 02/02/2015  . Headache 08/16/2013  . Myalgia and myositis 08/16/2013  . Syncope and collapse 08/16/2013    Orientation RESPIRATION BLADDER Height & Weight     Self, Time, Situation, Place  Normal Continent Weight:   Height:  5' 3.5" (1.613 m)  BEHAVIORAL SYMPTOMS/MOOD NEUROLOGICAL BOWEL NUTRITION STATUS      Continent  (Please see d/c summary)  AMBULATORY STATUS COMMUNICATION OF NEEDS Skin   Extensive Assist Verbally Surgical wounds (Closed incision right leg, Compression wrap (with external fixator))                       Personal Care Assistance Level of Assistance  Bathing, Feeding, Dressing Bathing Assistance: Maximum assistance Feeding assistance: Limited assistance Dressing Assistance: Maximum assistance     Functional Limitations Info  Sight, Hearing, Speech Sight Info: Adequate Hearing Info: Adequate Speech Info: Adequate    SPECIAL CARE FACTORS FREQUENCY  PT (By licensed PT), OT (By licensed OT)     PT Frequency: 3x week OT Frequency: 3x week            Contractures Contractures Info: Not  present    Additional Factors Info  Code Status, Allergies Code Status Info: Full Code Allergies Info: Adhesive (tape), Codeine           Current Medications (01/09/2017):  This is the current hospital active medication list Current Facility-Administered Medications  Medication Dose Route Frequency Provider Last Rate Last Dose  . 0.9 %  sodium chloride infusion   Intravenous Continuous Samson Frederic, MD   Stopped at 01/08/17 0900  . acetaminophen (TYLENOL) tablet 650 mg  650 mg Oral Q6H PRN Samson Frederic, MD       Or  . acetaminophen (TYLENOL) suppository 650 mg  650 mg Rectal Q6H PRN Samson Frederic, MD      . calcium-vitamin D (OSCAL WITH D) 500-200 MG-UNIT per tablet 1 tablet  1 tablet Oral BID Samson Frederic, MD   1 tablet at 01/09/17 0900  . cholecalciferol (VITAMIN D) tablet 1,000 Units  1,000 Units Oral TID Samson Frederic, MD   1,000 Units at 01/09/17 0900  . dicyclomine (BENTYL) tablet 20 mg  20 mg Oral TID AC Samson Frederic, MD   20 mg at 01/09/17 1233  . docusate sodium (COLACE) capsule 100 mg  100 mg Oral BID Samson Frederic, MD   100 mg at 01/09/17 0900  . DULoxetine (CYMBALTA) DR capsule 60 mg  60 mg Oral BID Samson Frederic, MD   60 mg at 01/09/17 0859  . enoxaparin (LOVENOX) injection 30 mg  30 mg Subcutaneous Q24H Samson Frederic, MD   30  mg at 01/09/17 0858  . furosemide (LASIX) tablet 40 mg  40 mg Oral Daily Samson FredericBrian Vanette Noguchi, MD   40 mg at 01/09/17 0900  . furosemide (LASIX) tablet 40 mg  40 mg Oral Daily Samson FredericBrian Dragon Thrush, MD   40 mg at 01/08/17 1626  . gabapentin (NEURONTIN) capsule 1,200 mg  1,200 mg Oral Q lunch Samson FredericBrian Eva Vallee, MD   1,200 mg at 01/09/17 1233  . gabapentin (NEURONTIN) capsule 800 mg  800 mg Oral TID Samson FredericBrian Tujuana Kilmartin, MD   800 mg at 01/09/17 0859  . HYDROcodone-acetaminophen (NORCO/VICODIN) 5-325 MG per tablet 1-2 tablet  1-2 tablet Oral Q4H PRN Samson FredericBrian Danylle Ouk, MD   2 tablet at 01/09/17 0547  . HYDROmorphone (DILAUDID) injection 0.5 mg  0.5 mg Intravenous Q2H  PRN Samson FredericBrian Alegandro Macnaughton, MD   0.5 mg at 01/09/17 0027  . insulin aspart (novoLOG) injection 0-20 Units  0-20 Units Subcutaneous TID WC Samson FredericBrian Melyna Huron, MD   3 Units at 01/08/17 1207  . insulin glargine (LANTUS) injection 40 Units  40 Units Subcutaneous Daily Samson FredericBrian Adelyna Brockman, MD   40 Units at 01/09/17 1000  . magnesium oxide (MAG-OX) tablet 200 mg  200 mg Oral BID Samson FredericBrian Cloy Cozzens, MD   200 mg at 01/09/17 0900  . methocarbamol (ROBAXIN) tablet 500 mg  500 mg Oral Q6H PRN Samson FredericBrian Ettamae Barkett, MD   500 mg at 01/09/17 0547   Or  . methocarbamol (ROBAXIN) 500 mg in dextrose 5 % 50 mL IVPB  500 mg Intravenous Q6H PRN Samson FredericBrian Melah Ebling, MD      . metoCLOPramide (REGLAN) tablet 5-10 mg  5-10 mg Oral Q8H PRN Samson FredericBrian Franciscojavier Wronski, MD       Or  . metoCLOPramide (REGLAN) injection 5-10 mg  5-10 mg Intravenous Q8H PRN Samson FredericBrian Synethia Endicott, MD      . metoprolol succinate (TOPROL-XL) 24 hr tablet 25 mg  25 mg Oral BID Samson FredericBrian Quetzali Heinle, MD   25 mg at 01/09/17 0859  . omega-3 acid ethyl esters (LOVAZA) capsule 1 g  1 g Oral BID Samson FredericBrian Shruthi Northrup, MD   1 g at 01/09/17 0859  . ondansetron (ZOFRAN) tablet 4 mg  4 mg Oral Q6H PRN Samson FredericBrian Harlie Ragle, MD       Or  . ondansetron [facility](ZOFRAN) injection 4 mg  4 mg Intravenous Q6H PRN Samson FredericBrian Joselito Fieldhouse, MD      . pantoprazole (PROTONIX) EC tablet 40 mg  40 mg Oral Daily Samson FredericBrian Zoeann Mol, MD   40 mg at 01/09/17 0859  . polycarbophil (FIBERCON) tablet   Oral Daily Samson FredericBrian Sara Selvidge, MD      . polyethylene glycol (MIRALAX / GLYCOLAX) packet 17 g  17 g Oral Daily PRN Samson FredericBrian Faten Frieson, MD      . ramipril (ALTACE) capsule 10 mg  10 mg Oral Daily Samson FredericBrian Mariselda Badalamenti, MD   10 mg at 01/09/17 1226  . rosuvastatin (CRESTOR) tablet 5 mg  5 mg Oral Daily Samson FredericBrian Glayds Insco, MD   5 mg at 01/09/17 0900  . senna (SENOKOT) tablet 8.6 mg  1 tablet Oral BID Samson FredericBrian Shameek Nyquist, MD   8.6 mg at 01/09/17 0900  . vitamin E capsule 400 Units  400 Units Oral Daily Samson FredericBrian Tian Davison, MD   400 Units at 01/09/17 1226  . zolpidem (AMBIEN) tablet 5 mg  5 mg Oral QHS  PRN Samson FredericBrian Dominie Benedick, MD         Discharge Medications: Please see discharge summary for a list of discharge medications.  Relevant Imaging Results:  Relevant Lab Results:   Additional Information  SSN: [id number]  Volney American, LCSW

## 2017-01-10 ENCOUNTER — Encounter (HOSPITAL_COMMUNITY): Payer: Self-pay | Admitting: General Practice

## 2017-01-10 LAB — BASIC METABOLIC PANEL
ANION GAP: 10 (ref 5–15)
BUN: 13 mg/dL (ref 6–20)
CO2: 33 mmol/L — ABNORMAL HIGH (ref 22–32)
CREATININE: 1.3 mg/dL — AB (ref 0.44–1.00)
Calcium: 9.1 mg/dL (ref 8.9–10.3)
Chloride: 100 mmol/L — ABNORMAL LOW (ref 101–111)
GFR calc non Af Amer: 41 mL/min — ABNORMAL LOW (ref >60)
GFR, EST AFRICAN AMERICAN: 48 mL/min — AB (ref >60)
Glucose, Bld: 91 mg/dL (ref 65–99)
Potassium: 3.2 mmol/L — ABNORMAL LOW (ref 3.5–5.1)
SODIUM: 143 mmol/L (ref 135–145)

## 2017-01-10 LAB — GLUCOSE, CAPILLARY
GLUCOSE-CAPILLARY: 87 mg/dL (ref 65–99)
GLUCOSE-CAPILLARY: 84 mg/dL (ref 65–99)

## 2017-01-10 NOTE — Progress Notes (Signed)
Report called to [facility]. Transport arranged for 1PM

## 2017-01-10 NOTE — Progress Notes (Signed)
Occupational Therapy Treatment Patient Details Name: Chloe [name] MRN: [id number] DOB: 26-Sep-1946 Today's Date: 01/10/2017    History of present illness 71 y.o. female admitted to [facility] for surgery on 01/07/17 after falling at home on (01/04/17) injuring both of her lower legs.    She went to the ED where x-rays revealed L fibular shaft fx and right bimalleolar fx with subluxation of the tauls.  She was admitted to [facility] on 01/07/17 and underwent external fixation and closed reduction of her right ankle.  She is now NWB right and WBAT left.  She has significant PMHx of vertebral artery dissection, orthostatic hypotension, HTN, fibromyalgia, DM, chronic renal insufficiency, cervical spondylosis, bil TKA.     OT comments  Pt making progress towards OT goals this session with stand pivot to [facility]. Pt continues to benefit from skilled OT in the acute care setting to maximize independence and safety in ADL and functional transfers.   Follow Up Recommendations  SNF;Supervision/Assistance - 24 hour    Equipment Recommendations  3 in 1 bedside commode;Other (comment) (BARIATRIC size needed due to body shape)    Recommendations for Other Services      Precautions / Restrictions Precautions Precautions: Fall Precaution Comments: history of falls and pt reports left knee buckles  Required Braces or Orthoses: Other Brace/Splint Other Brace/Splint: external fixator on RLE Restrictions Weight Bearing Restrictions: Yes RLE Weight Bearing: Non weight bearing       Mobility Bed Mobility Overal bed mobility: Modified Independent Bed Mobility: Supine to Sit     Supine to sit: HOB elevated;Modified independent (Device/Increase time)     General bed mobility comments: Pt able to get EOB with HOB elevated and use of the bed rail.  She is able to lift her right leg against gravity and move it EOB without assist.   Transfers Overall transfer level: Needs assistance Equipment used: Rolling walker (2  wheeled) Transfers: Sit to/from [employer] Sit to Stand: +2 physical assistance;Min assist;From elevated surface Stand pivot transfers: +2 physical assistance;Min assist;From elevated surface       General transfer comment: Two person min assist to stand from elevated bed.  Pt using momentum.  Assist needed at trunk for last 1/4 of stand to power up and stabilize on RW.  Pt was strong enough today to pivot with RW to [facility].  Assist needed to help steady trunk for balance and assist in steering RW around with her to the left.     Balance Overall balance assessment: Needs assistance Sitting-balance support: Feet supported Sitting balance-Leahy Scale: Good Sitting balance - Comments: supervision EOB.    Standing balance support: Bilateral upper extremity supported Standing balance-Leahy Scale: Poor                     ADL Overall ADL's : Needs assistance/impaired     Grooming: Wash/dry face;Oral care;Set up;Sitting Grooming Details (indicate cue type and reason): in recliner                 Toilet Transfer: Minimal assistance;+2 for safety/equipment;Stand-pivot;BSC;RW;Requires wide/bariatric Toilet Transfer Details (indicate cue type and reason): requires bariatric equipment due to body shape Toileting- Clothing Manipulation and Hygiene: Maximal assistance;Sit to/from stand Toileting - Clothing Manipulation Details (indicate cue type and reason): Therapist performing cleaning after BM       General ADL Comments: Pt very motivated to maximize independence in ADL      Vision  Perception     Praxis      Cognition   Behavior During Therapy: WFL for tasks assessed/performed Overall Cognitive Status: Within Functional Limits for tasks assessed                       Extremity/Trunk Assessment               Exercises     Shoulder Instructions       General Comments      Pertinent Vitals/ Pain        Pain Assessment: Faces Faces Pain Scale: Hurts little more Pain Location: right lower leg Pain Descriptors / Indicators: Grimacing;Guarding Pain Intervention(s): Limited activity within patient's tolerance;Monitored during session;Repositioned  Home Living Family/patient expects to be discharged to:: Skilled nursing facility ([facility]; [place]) Living Arrangements: Alone                                      Prior Functioning/Environment              Frequency  Min 2X/week        Progress Toward Goals  OT Goals(current goals can now be found in the care plan section)  Progress towards OT goals: Progressing toward goals  Acute Rehab OT Goals Patient Stated Goal: to get more independent and stay mobile between her surgeries.  OT Goal Formulation: With patient Time For Goal Achievement: 01/22/17 Potential to Achieve Goals: Good  Plan Discharge plan remains appropriate    Co-evaluation    PT/OT/SLP Co-Evaluation/Treatment: Yes Reason for Co-Treatment: For patient/therapist safety;To address functional/ADL transfers PT goals addressed during session: Mobility/safety with mobility OT goals addressed during session: ADL's and self-care      End of Session Equipment Utilized During Treatment: Gait belt;Rolling walker   Activity Tolerance Patient tolerated treatment well   Patient Left in chair;with call bell/phone within reach;with family/visitor present   Nurse Communication Mobility status;Other (comment);Weight bearing status        Time: [id number] OT Time Calculation (min): 25 min  Charges: OT General Charges $OT Visit: 1 Procedure OT Treatments $Self Care/Home Management : 8-22 mins  Evern BioLaura J Breannah Kratt 01/10/2017, 1:39 PM  Sherryl MangesLaura Mataio Mele OTR/L [phone]

## 2017-01-10 NOTE — Clinical Social Work Placement (Signed)
   CLINICAL SOCIAL WORK PLACEMENT  NOTE  Date:  01/10/2017  Patient Details  Name: Kem KaysSANTA M Podesta MRN: [id number] Date of Birth: 06/27/1946  Clinical Social Work is seeking post-discharge placement for this patient at the Skilled  Nursing Facility level of care (*CSW will initial, date and re-position this form in  chart as items are completed):      Patient/family provided with [facility] Clinical Social Work Department's list of facilities offering this level of care within the geographic area requested by the patient (or if unable, by the patient's family).      Patient/family informed of their freedom to choose among providers that offer the needed level of care, that participate in Medicare, Medicaid or managed care program needed by the patient, have an available bed and are willing to accept the patient.      Patient/family informed of [facility]'s ownership interest in [facility] and [facility], as well as of the fact that they are under no obligation to receive care at these facilities.  PASRR submitted to EDS on       PASRR number received on 01/09/17     Existing PASRR number confirmed on       FL2 transmitted to all facilities in geographic area requested by pt/family on 01/09/17     FL2 transmitted to all facilities within larger geographic area on       Patient informed that his/her managed care company has contracts with or will negotiate with certain facilities, including the following:        Yes   Patient/family informed of bed offers received.  [facility]     [facility]  Physician recommends and patient chooses bed at      SNF Patient to be transferred to   on  .  01/10/2017   Patient to be transferred to facility by       EMS  Patient family notified on   of transfer.   Family, per patient.  Name of family member notified:        PHYSICIAN Please prepare priority discharge summary, including medications, Please prepare  prescriptions, Please sign FL2     Additional Comment:    _______________________________________________ Raye Sorrowoble, Charene Mccallister N, LCSW 01/10/2017, 9:50 AM

## 2017-01-10 NOTE — Care Management Important Message (Signed)
Important Message  Patient Details  Name: Chloe [name] MRN: [id number] Date of Birth: 06/11/1946   Medicare Important Message Given:  Yes    Jermond Burkemper 01/10/2017, 1:19 PM

## 2017-01-10 NOTE — Progress Notes (Signed)
   Subjective:  Patient reports pain as mild to moderate.  Denies N/V/CP/SOB.   Objective:   VITALS:   Vitals:   01/09/17 2134 01/09/17 2136 01/09/17 2207 01/10/17 0719  BP: 120/66 120/66 90/76 92/74   Pulse:  63 63 65  Resp:   17 17  Temp:   98.7 F (37.1 C) 98.7 F (37.1 C)  TempSrc:   Oral Oral  SpO2:   100% 100%    NAD RLE: ex fix intact. Pin sites c/d/i. Mild ss drainage from medial ankle blisters. 1+ DP. Wiggles toes. Subjective sensory change SP, SILT DP/PT.  Lab Results  Component Value Date   WBC 8.2 01/07/2017   HGB 11.6 (L) 01/07/2017   HCT 36.0 01/07/2017   MCV 90.0 01/07/2017   PLT 158 01/07/2017   BMET    Component Value Date/Time   NA 143 01/10/2017 0354   K 3.2 (L) 01/10/2017 0354   CL 100 (L) 01/10/2017 0354   CO2 33 (H) 01/10/2017 0354   GLUCOSE 91 01/10/2017 0354   BUN 13 01/10/2017 0354   CREATININE 1.30 (H) 01/10/2017 0354   CALCIUM 9.1 01/10/2017 0354   GFRNONAA 41 (L) 01/10/2017 0354   GFRAA 48 (L) 01/10/2017 0354     Assessment/Plan: 3 Days Post-Op   Principal Problem:   Closed fracture dislocation of right ankle L fibula shaft fracture  Strict NWB RLE WBAT LLE Elevate toes above nose on blankets/pillows Ice DVT ppx: lovenox, SCDs, TEDs PO pain control PT/OT Dispo: D/C to SNF for now, will need definitive fixation in the future (1-2 weeks)   Everlee Quakenbush, Cloyde ReamsBrian James 01/10/2017, 9:55 AM   Samson FredericBrian Shawndell Schillaci, MD Cell [phone]

## 2017-01-10 NOTE — Progress Notes (Signed)
01/10/17 1132  PT Visit Information  Last PT Received On 01/10/17  Assistance Needed +2 (for safety)  PT/OT/SLP Co-Evaluation/Treatment Yes  Reason for Co-Treatment For patient/therapist safety  PT goals addressed during session Mobility/safety with mobility;Balance;Proper use of DME;Strengthening/ROM  History of Present Illness 71 y.o. female [facility] on 01/07/17 after falling at home on (01/04/17) injuring both of her lower legs.    She went to the ED where x-rays revealed L fibular shaft fx and right bimalleolar fx with subluxation of the tauls.  [facility] and underwent external fixation and closed reduction of her right ankle.  She is now NWB right and WBAT left.  She has significant PMHx of vertebral artery dissection, orthostatic hypotension, HTN, fibromyalgia, DM, chronic renal insufficiency, cervical spondylosis, bil TKA.    Subjective Data  Subjective Pt and family report that MD only wants her up for 15-20 mins due to inability to keep her leg elevated and post operative wound weeping today.  MD did not leave order or comments in his notes to this effect, but does put "toes over nose" indicating that he wants her leg elevated above the level of her nose.    Patient Stated Goal to get more independent and stay mobile between her surgeries.   Precautions  Precautions Fall  Precaution Comments history of falls and pt reports left knee buckles   Restrictions  RLE Weight Bearing NWB  Pain Assessment  Pain Assessment Faces  Faces Pain Scale 4  Pain Location right lower leg  Pain Descriptors / Indicators Grimacing;Guarding  Pain Intervention(s) Limited activity within patient's tolerance;Monitored during session;Repositioned  Cognition  Arousal/Alertness Awake/alert  Behavior During Therapy WFL for tasks assessed/performed  Overall Cognitive Status Within Functional Limits for tasks assessed  Bed Mobility  Overal bed mobility Modified  Independent  Bed Mobility Supine to Sit  Supine to sit HOB elevated;Modified independent (Device/Increase time)  General bed mobility comments Pt able to get EOB with HOB elevated and use of the bed rail.  She is able to lift her right leg against gravity and move it EOB without assist.   Transfers  Overall transfer level Needs assistance  Equipment used Rolling walker (2 wheeled)  Transfers Sit to/from Stand;Stand Pivot Transfers  Sit to Stand +2 physical assistance;Min assist;From elevated surface  Stand pivot transfers +2 physical assistance;Min assist;From elevated surface  General transfer comment Two person min assist to stand from elevated bed.  Pt using momentum.  Assist needed at trunk for last 1/4 of stand to power up and stabilize on RW.  Pt was strong enough today to pivot [facility].  Assist needed to help steady trunk for balance and assist in steering RW around with her to the left.   Ambulation/Gait  General Gait Details unable at this time.   Balance  Sitting-balance support Feet supported  Sitting balance-Leahy Scale Good  Standing balance support Bilateral upper extremity supported  Standing balance-Leahy Scale Poor  Other Exercises  Other Exercises Encouraged continued compliance with HEP given yesterday and that pt should do exercises on BOTH sides (she reports she was just doing them on the left).   PT - End of Session  Equipment Utilized During Treatment Gait belt  Activity Tolerance Patient limited by pain;Patient limited by fatigue  Patient left in chair;with call bell/phone within reach;with family/visitor present  PT - Assessment/Plan  PT Plan Current plan remains appropriate  PT Frequency (ACUTE ONLY) Min 3X/week  Follow Up Recommendations SNF  PT equipment Rolling walker with 5" wheels;Wheelchair (measurements PT);Wheelchair cushion (measurements PT);3in1 (PT)  PT Goal Progression  Progress towards PT goals Progressing toward goals  PT Time Calculation   PT Start Time (ACUTE ONLY) 1113 (stopped for privacy to toilet from 11:27-11:42)  PT Stop Time (ACUTE ONLY) 1154  PT Time Calculation (min) (ACUTE ONLY) 41 min  PT General Charges  $$ ACUTE PT VISIT 1 Procedure  PT Treatments  $Therapeutic Activity 8-22 mins  01/10/2017 Pt is progressing well with her mobility and was able to safely stand pivot transfer to [facility] and to recliner chair with two person assist.  Today is the first day she was able to pivot.  She is due to d/c to SNF today and will return in 1-2 weeks for ORIF of the same ankle. Rollene Rotunda Mahdiya Mossberg, PT, DPT [phone]

## 2017-01-10 NOTE — Progress Notes (Signed)
Patient is medically stable for discharge to SNF today. Patient will transport to [facility]. [facility] is aware of admission and ready for patient.  Patient is going to room 107A with RN to call report: [phone].  Patient will transport by EMS. Patient daughter contacted regarding discharge.  All information sent through HUB. DC today.  Deretha EmoryHannah Nivia Gervase LCSW, MSW Clinical Social Work: Optician, dispensingystem Wide [employer] Coverage for :  [phone]

## 2017-01-15 ENCOUNTER — Ambulatory Visit: Payer: Self-pay | Admitting: Orthopedic Surgery

## 2017-01-17 ENCOUNTER — Encounter (HOSPITAL_COMMUNITY): Payer: Self-pay | Admitting: *Deleted

## 2017-01-17 MED ORDER — DEXTROSE 5 % IV SOLN
3.0000 g | INTRAVENOUS | Status: AC
  Administered 2017-01-20: 3 g via INTRAVENOUS
  Filled 2017-01-17: qty 3000

## 2017-01-17 MED ORDER — SODIUM CHLORIDE 0.9 % IV SOLN: INTRAVENOUS | Status: DC

## 2017-01-17 NOTE — Progress Notes (Addendum)
       WHAT DO I DO ABOUT MY DIABETES MEDICATION?  . THE NIGHT BEFORE SURGERY, take  5  units of Lantus insulin.      . THE MORNING OF SURGERY, take 20 units of Lantus insulin. If  CBG is greater than 70.   . If your CBG is greater than 220 mg/dL, you may take  of your sliding scale (correction) dose of insulin. .  How do I manage my blood sugar before surgery? o Check your blood sugar the morning of your surgery when you wake up and every 2 hours until you get to the Short Stay unit. o  . If your blood sugar is less than 70 mg/dL, you will need to treat for low blood sugar: o Do not take insulin. o Treat a low blood sugar (less than 70 mg/dL)  4 glucose tablets, OR glucose gel. o Recheck blood sugar in 15 minutes after treatment (to make sure it is greater than 70 mg/dL). If your blood sugar is not greater than 70 mg/dL on recheck, call [phone] for further instructions. . Report your blood sugar to the short stay nurse when you get to Short Stay.

## 2017-01-17 NOTE — Pre-Procedure Instructions (Signed)
    Kem KaysSanta M Kamaka  01/17/2017    Your procedure is scheduled on Monday, January 29.  [facility] Ad[facility] at 12:45 PM  Call this number if you have problems the morning of surgery:[phone]   Remember:  Do not eat food  after midnight Sunday, January 28.          May have Clear Liquids until 8:00 AM Monday morning.  Take these medicines the morning of surgery with A SIP OF WATER : Duloxetine,Gabapentin, Omeprazole, Metoprolol.           See additional sheet with instructions for Insulin. May take Norco if needed for pain, if she tolerates without food. Hold the following until after surgery: Fish Oil, Vitamin D3, Cinnamon, Vitamin E, Turmeric.  Orders are per Dr Jairo Benarswell Jackson   Do not wear jewelry, make-up or nail polish.  Do not wear lotions, powders, or perfumes, or deoderant.

## 2017-01-20 ENCOUNTER — Inpatient Hospital Stay (HOSPITAL_COMMUNITY): Payer: Medicare Other

## 2017-01-20 ENCOUNTER — Inpatient Hospital Stay (HOSPITAL_COMMUNITY): Payer: Medicare Other | Admitting: Anesthesiology

## 2017-01-20 ENCOUNTER — Inpatient Hospital Stay (HOSPITAL_COMMUNITY)
Admission: RE | Admit: 2017-01-20 | Discharge: 2017-01-21 | DRG: 494 | Disposition: A | Discharge status: Skilled Nursing Facility | Payer: Medicare Other | Source: Ambulatory Visit | Attending: Orthopedic Surgery | Admitting: Orthopedic Surgery

## 2017-01-20 ENCOUNTER — Encounter (HOSPITAL_COMMUNITY)
Admission: RE | Disposition: A | Discharge status: Skilled Nursing Facility | Payer: Self-pay | Source: Ambulatory Visit | Attending: Orthopedic Surgery

## 2017-01-20 ENCOUNTER — Encounter (HOSPITAL_COMMUNITY): Payer: Self-pay | Admitting: Surgery

## 2017-01-20 DIAGNOSIS — Z96653 Presence of artificial knee joint, bilateral: Secondary | ICD-10-CM | POA: Diagnosis present

## 2017-01-20 DIAGNOSIS — I129 Hypertensive chronic kidney disease with stage 1 through stage 4 chronic kidney disease, or unspecified chronic kidney disease: Secondary | ICD-10-CM | POA: Diagnosis not present

## 2017-01-20 DIAGNOSIS — Z833 Family history of diabetes mellitus: Secondary | ICD-10-CM

## 2017-01-20 DIAGNOSIS — X58XXXA Exposure to other specified factors, initial encounter: Secondary | ICD-10-CM | POA: Diagnosis not present

## 2017-01-20 DIAGNOSIS — Z794 Long term (current) use of insulin: Secondary | ICD-10-CM

## 2017-01-20 DIAGNOSIS — S82851A Displaced trimalleolar fracture of right lower leg, initial encounter for closed fracture: Secondary | ICD-10-CM | POA: Diagnosis present

## 2017-01-20 DIAGNOSIS — M797 Fibromyalgia: Secondary | ICD-10-CM | POA: Diagnosis present

## 2017-01-20 DIAGNOSIS — Z820 Family history of epilepsy and other diseases of the nervous system: Secondary | ICD-10-CM

## 2017-01-20 DIAGNOSIS — E1122 Type 2 diabetes mellitus with diabetic chronic kidney disease: Secondary | ICD-10-CM | POA: Diagnosis not present

## 2017-01-20 DIAGNOSIS — Z8249 Family history of ischemic heart disease and other diseases of the circulatory system: Secondary | ICD-10-CM | POA: Diagnosis not present

## 2017-01-20 DIAGNOSIS — Z888 Allergy status to other drugs, medicaments and biological substances status: Secondary | ICD-10-CM | POA: Diagnosis not present

## 2017-01-20 DIAGNOSIS — Z961 Presence of intraocular lens: Secondary | ICD-10-CM | POA: Diagnosis present

## 2017-01-20 DIAGNOSIS — K259 Gastric ulcer, unspecified as acute or chronic, without hemorrhage or perforation: Secondary | ICD-10-CM | POA: Diagnosis not present

## 2017-01-20 DIAGNOSIS — Z90711 Acquired absence of uterus with remaining cervical stump: Secondary | ICD-10-CM | POA: Diagnosis not present

## 2017-01-20 DIAGNOSIS — N183 Chronic kidney disease, stage 3 (moderate): Secondary | ICD-10-CM | POA: Diagnosis not present

## 2017-01-20 DIAGNOSIS — Z8673 Personal history of transient ischemic attack (TIA), and cerebral infarction without residual deficits: Secondary | ICD-10-CM

## 2017-01-20 DIAGNOSIS — Z419 Encounter for procedure for purposes other than remedying health state, unspecified: Secondary | ICD-10-CM

## 2017-01-20 DIAGNOSIS — Z9049 Acquired absence of other specified parts of digestive tract: Secondary | ICD-10-CM | POA: Diagnosis not present

## 2017-01-20 DIAGNOSIS — Z7982 Long term (current) use of aspirin: Secondary | ICD-10-CM

## 2017-01-20 DIAGNOSIS — K219 Gastro-esophageal reflux disease without esophagitis: Secondary | ICD-10-CM | POA: Diagnosis present

## 2017-01-20 DIAGNOSIS — Z87891 Personal history of nicotine dependence: Secondary | ICD-10-CM | POA: Diagnosis not present

## 2017-01-20 DIAGNOSIS — Z886 Allergy status to analgesic agent status: Secondary | ICD-10-CM | POA: Diagnosis not present

## 2017-01-20 DIAGNOSIS — Z79899 Other long term (current) drug therapy: Secondary | ICD-10-CM

## 2017-01-20 DIAGNOSIS — S82891A Other fracture of right lower leg, initial encounter for closed fracture: Secondary | ICD-10-CM | POA: Diagnosis present

## 2017-01-20 DIAGNOSIS — Z09 Encounter for follow-up examination after completed treatment for conditions other than malignant neoplasm: Secondary | ICD-10-CM

## 2017-01-20 DIAGNOSIS — S93431A Sprain of tibiofibular ligament of right ankle, initial encounter: Secondary | ICD-10-CM | POA: Diagnosis not present

## 2017-01-20 HISTORY — PX: OPEN REDUCTION INTERNAL FIXATION (ORIF) TIBIA/FIBULA FRACTURE: SHX5992

## 2017-01-20 LAB — BASIC METABOLIC PANEL
ANION GAP: 12 (ref 5–15)
BUN: 20 mg/dL (ref 6–20)
CALCIUM: 9.9 mg/dL (ref 8.9–10.3)
CO2: 29 mmol/L (ref 22–32)
Chloride: 101 mmol/L (ref 101–111)
Creatinine, Ser: 1.38 mg/dL — ABNORMAL HIGH (ref 0.44–1.00)
GFR calc Af Amer: 44 mL/min — ABNORMAL LOW (ref >60)
GFR, EST NON AFRICAN AMERICAN: 38 mL/min — AB (ref >60)
GLUCOSE: 108 mg/dL — AB (ref 65–99)
POTASSIUM: 3.8 mmol/L (ref 3.5–5.1)
SODIUM: 142 mmol/L (ref 135–145)

## 2017-01-20 LAB — CBC
HCT: 44.4 % (ref 36.0–46.0)
HCT: 39.1 % (ref 36.0–46.0)
HEMOGLOBIN: 12.2 g/dL (ref 12.0–15.0)
Hemoglobin: 14.2 g/dL (ref 12.0–15.0)
MCH: 29.3 pg (ref 26.0–34.0)
MCH: 28.8 pg (ref 26.0–34.0)
MCHC: 32 g/dL (ref 30.0–36.0)
MCHC: 31.2 g/dL (ref 30.0–36.0)
MCV: 91.5 fL (ref 78.0–100.0)
MCV: 92.4 fL (ref 78.0–100.0)
PLATELETS: 201 10*3/uL (ref 150–400)
Platelets: 256 10*3/uL (ref 150–400)
RBC: 4.85 MIL/uL (ref 3.87–5.11)
RBC: 4.23 MIL/uL (ref 3.87–5.11)
RDW: 13.6 % (ref 11.5–15.5)
RDW: 13.7 % (ref 11.5–15.5)
WBC: 6.5 10*3/uL (ref 4.0–10.5)
WBC: 6.5 10*3/uL (ref 4.0–10.5)

## 2017-01-20 LAB — GLUCOSE, CAPILLARY
GLUCOSE-CAPILLARY: 113 mg/dL — AB (ref 65–99)
GLUCOSE-CAPILLARY: 88 mg/dL (ref 65–99)
GLUCOSE-CAPILLARY: 95 mg/dL (ref 65–99)
Glucose-Capillary: 85 mg/dL (ref 65–99)
Glucose-Capillary: 89 mg/dL (ref 65–99)

## 2017-01-20 LAB — CREATININE, SERUM
CREATININE: 1.3 mg/dL — AB (ref 0.44–1.00)
GFR calc Af Amer: 48 mL/min — ABNORMAL LOW (ref >60)
GFR, EST NON AFRICAN AMERICAN: 41 mL/min — AB (ref >60)

## 2017-01-20 SURGERY — OPEN REDUCTION INTERNAL FIXATION (ORIF) TIBIA/FIBULA FRACTURE
Anesthesia: General | Laterality: Right

## 2017-01-20 MED ORDER — MUPIROCIN 2 % EX OINT
TOPICAL_OINTMENT | CUTANEOUS | Status: AC
  Filled 2017-01-20: qty 22

## 2017-01-20 MED ORDER — PSYLLIUM 95 % PO PACK
1.0000 | PACK | Freq: Every day | ORAL | Status: DC
  Administered 2017-01-21: 1 via ORAL
  Filled 2017-01-20: qty 1

## 2017-01-20 MED ORDER — METHOCARBAMOL 500 MG PO TABS
500.0000 mg | ORAL_TABLET | Freq: Four times a day (QID) | ORAL | Status: DC | PRN
  Administered 2017-01-21 (×2): 500 mg via ORAL
  Filled 2017-01-20 (×3): qty 1

## 2017-01-20 MED ORDER — ONDANSETRON HCL 4 MG/2ML IJ SOLN
INTRAMUSCULAR | Status: DC | PRN
  Administered 2017-01-20: 4 mg via INTRAVENOUS

## 2017-01-20 MED ORDER — 0.9 % SODIUM CHLORIDE (POUR BTL) OPTIME
TOPICAL | Status: DC | PRN
  Administered 2017-01-20: 1000 mL

## 2017-01-20 MED ORDER — LIDOCAINE HCL (CARDIAC) 20 MG/ML IV SOLN
INTRAVENOUS | Status: DC | PRN
  Administered 2017-01-20: 60 mg via INTRAVENOUS

## 2017-01-20 MED ORDER — FENTANYL CITRATE (PF) 100 MCG/2ML IJ SOLN: 25.0000 ug | INTRAMUSCULAR | Status: DC | PRN

## 2017-01-20 MED ORDER — ROPIVACAINE HCL 7.5 MG/ML IJ SOLN
INTRAMUSCULAR | Status: DC | PRN
  Administered 2017-01-20: 20 mL via PERINEURAL

## 2017-01-20 MED ORDER — SENNA 8.6 MG PO TABS
1.0000 | ORAL_TABLET | Freq: Two times a day (BID) | ORAL | Status: DC
  Administered 2017-01-20 – 2017-01-21 (×2): 8.6 mg via ORAL
  Filled 2017-01-20 (×2): qty 1

## 2017-01-20 MED ORDER — GABAPENTIN 400 MG PO CAPS
1200.0000 mg | ORAL_CAPSULE | Freq: Every day | ORAL | Status: DC
  Administered 2017-01-21: 1200 mg via ORAL
  Filled 2017-01-20: qty 3

## 2017-01-20 MED ORDER — DULOXETINE HCL 60 MG PO CPEP
60.0000 mg | ORAL_CAPSULE | Freq: Two times a day (BID) | ORAL | Status: DC
  Administered 2017-01-20 – 2017-01-21 (×2): 60 mg via ORAL
  Filled 2017-01-20 (×2): qty 1

## 2017-01-20 MED ORDER — PROPOFOL 10 MG/ML IV BOLUS
INTRAVENOUS | Status: DC | PRN
  Administered 2017-01-20: 140 mg via INTRAVENOUS

## 2017-01-20 MED ORDER — LACTATED RINGERS IV SOLN
INTRAVENOUS | Status: DC
  Administered 2017-01-20: 14:00:00 via INTRAVENOUS

## 2017-01-20 MED ORDER — ACETAMINOPHEN 650 MG RE SUPP: 650.0000 mg | Freq: Four times a day (QID) | RECTAL | Status: DC | PRN

## 2017-01-20 MED ORDER — INSULIN ASPART 100 UNIT/ML ~~LOC~~ SOLN
0.0000 [IU] | Freq: Three times a day (TID) | SUBCUTANEOUS | Status: DC
  Administered 2017-01-21: 2 [IU] via SUBCUTANEOUS

## 2017-01-20 MED ORDER — ONDANSETRON HCL 4 MG PO TABS: 4.0000 mg | ORAL_TABLET | Freq: Four times a day (QID) | ORAL | Status: DC | PRN

## 2017-01-20 MED ORDER — VITAMIN D 1000 UNITS PO TABS
1000.0000 [IU] | ORAL_TABLET | Freq: Three times a day (TID) | ORAL | Status: DC
  Administered 2017-01-20 – 2017-01-21 (×3): 1000 [IU] via ORAL
  Filled 2017-01-20 (×5): qty 1

## 2017-01-20 MED ORDER — DEXTROSE 5 % IV SOLN
500.0000 mg | Freq: Four times a day (QID) | INTRAVENOUS | Status: DC | PRN
  Filled 2017-01-20: qty 5

## 2017-01-20 MED ORDER — GABAPENTIN 400 MG PO CAPS
800.0000 mg | ORAL_CAPSULE | ORAL | Status: DC
  Administered 2017-01-21 (×2): 800 mg via ORAL
  Filled 2017-01-20 (×2): qty 2

## 2017-01-20 MED ORDER — HYDROCODONE-ACETAMINOPHEN 5-325 MG PO TABS
1.0000 | ORAL_TABLET | ORAL | Status: DC | PRN
  Administered 2017-01-20: 1 via ORAL
  Administered 2017-01-21 (×4): 2 via ORAL
  Filled 2017-01-20 (×3): qty 2
  Filled 2017-01-20: qty 1
  Filled 2017-01-20: qty 2

## 2017-01-20 MED ORDER — METOCLOPRAMIDE HCL 5 MG/ML IJ SOLN: 5.0000 mg | Freq: Three times a day (TID) | INTRAMUSCULAR | Status: DC | PRN

## 2017-01-20 MED ORDER — MIDAZOLAM HCL 2 MG/2ML IJ SOLN
1.0000 mg | Freq: Once | INTRAMUSCULAR | Status: AC
  Administered 2017-01-20: 1 mg via INTRAVENOUS

## 2017-01-20 MED ORDER — FUROSEMIDE 40 MG PO TABS
40.0000 mg | ORAL_TABLET | Freq: Every day | ORAL | Status: DC
  Administered 2017-01-21: 40 mg via ORAL
  Filled 2017-01-20: qty 1

## 2017-01-20 MED ORDER — ONDANSETRON HCL 4 MG/2ML IJ SOLN: 4.0000 mg | Freq: Four times a day (QID) | INTRAMUSCULAR | Status: DC | PRN

## 2017-01-20 MED ORDER — LACTATED RINGERS IV SOLN
INTRAVENOUS | Status: DC | PRN
  Administered 2017-01-20 (×2): via INTRAVENOUS

## 2017-01-20 MED ORDER — METHYLCELLULOSE (LAXATIVE) 500 MG PO TABS: 1000.0000 mg | ORAL_TABLET | Freq: Every day | ORAL | Status: DC

## 2017-01-20 MED ORDER — PROMETHAZINE HCL 25 MG/ML IJ SOLN: 6.2500 mg | INTRAMUSCULAR | Status: DC | PRN

## 2017-01-20 MED ORDER — MIDAZOLAM HCL 2 MG/2ML IJ SOLN
INTRAMUSCULAR | Status: AC
  Filled 2017-01-20: qty 2

## 2017-01-20 MED ORDER — RAMIPRIL 10 MG PO CAPS
10.0000 mg | ORAL_CAPSULE | Freq: Every day | ORAL | Status: DC
  Administered 2017-01-21: 10 mg via ORAL
  Filled 2017-01-20: qty 1

## 2017-01-20 MED ORDER — BUPIVACAINE HCL (PF) 0.25 % IJ SOLN
INTRAMUSCULAR | Status: DC | PRN
  Administered 2017-01-20: 10 mL

## 2017-01-20 MED ORDER — PANTOPRAZOLE SODIUM 40 MG PO TBEC
40.0000 mg | DELAYED_RELEASE_TABLET | Freq: Every day | ORAL | Status: DC
  Administered 2017-01-21: 40 mg via ORAL
  Filled 2017-01-20: qty 1

## 2017-01-20 MED ORDER — DOCUSATE SODIUM 100 MG PO CAPS
100.0000 mg | ORAL_CAPSULE | Freq: Two times a day (BID) | ORAL | Status: DC
  Administered 2017-01-20 – 2017-01-21 (×2): 100 mg via ORAL
  Filled 2017-01-20 (×2): qty 1

## 2017-01-20 MED ORDER — ENOXAPARIN SODIUM 30 MG/0.3ML ~~LOC~~ SOLN
30.0000 mg | SUBCUTANEOUS | Status: DC
  Administered 2017-01-21: 30 mg via SUBCUTANEOUS
  Filled 2017-01-20: qty 0.3

## 2017-01-20 MED ORDER — FENTANYL CITRATE (PF) 100 MCG/2ML IJ SOLN
50.0000 ug | Freq: Once | INTRAMUSCULAR | Status: AC
  Administered 2017-01-20: 50 ug via INTRAVENOUS

## 2017-01-20 MED ORDER — ZOLPIDEM TARTRATE 5 MG PO TABS
5.0000 mg | ORAL_TABLET | Freq: Every evening | ORAL | Status: DC | PRN
  Administered 2017-01-20: 5 mg via ORAL
  Filled 2017-01-20: qty 1

## 2017-01-20 MED ORDER — INSULIN GLARGINE 100 UNIT/ML ~~LOC~~ SOLN
10.0000 [IU] | Freq: Every day | SUBCUTANEOUS | Status: DC
  Filled 2017-01-20 (×2): qty 0.1

## 2017-01-20 MED ORDER — MAGNESIUM CITRATE PO SOLN: 1.0000 | Freq: Once | ORAL | Status: DC | PRN

## 2017-01-20 MED ORDER — FUROSEMIDE 40 MG PO TABS
80.0000 mg | ORAL_TABLET | Freq: Every day | ORAL | Status: DC
  Administered 2017-01-21: 80 mg via ORAL
  Filled 2017-01-20: qty 2

## 2017-01-20 MED ORDER — SODIUM CHLORIDE 0.9 % IV SOLN
INTRAVENOUS | Status: DC
  Administered 2017-01-20: 23:00:00 via INTRAVENOUS

## 2017-01-20 MED ORDER — MIDAZOLAM HCL 2 MG/2ML IJ SOLN
INTRAMUSCULAR | Status: AC
  Administered 2017-01-20: 1 mg via INTRAVENOUS
  Filled 2017-01-20: qty 2

## 2017-01-20 MED ORDER — INSULIN GLARGINE 100 UNIT/ML ~~LOC~~ SOLN
40.0000 [IU] | Freq: Every day | SUBCUTANEOUS | Status: DC
  Administered 2017-01-21: 40 [IU] via SUBCUTANEOUS
  Filled 2017-01-20: qty 0.4

## 2017-01-20 MED ORDER — METOCLOPRAMIDE HCL 5 MG PO TABS: 5.0000 mg | ORAL_TABLET | Freq: Three times a day (TID) | ORAL | Status: DC | PRN

## 2017-01-20 MED ORDER — HYDROMORPHONE HCL 2 MG/ML IJ SOLN: 0.5000 mg | INTRAMUSCULAR | Status: DC | PRN

## 2017-01-20 MED ORDER — CEFAZOLIN SODIUM-DEXTROSE 2-4 GM/100ML-% IV SOLN
2.0000 g | Freq: Four times a day (QID) | INTRAVENOUS | Status: DC
  Administered 2017-01-21 (×2): 2 g via INTRAVENOUS
  Filled 2017-01-20 (×3): qty 100

## 2017-01-20 MED ORDER — DICYCLOMINE HCL 20 MG PO TABS
20.0000 mg | ORAL_TABLET | Freq: Three times a day (TID) | ORAL | Status: DC
  Administered 2017-01-20 – 2017-01-21 (×3): 20 mg via ORAL
  Filled 2017-01-20 (×3): qty 1

## 2017-01-20 MED ORDER — EPHEDRINE SULFATE 50 MG/ML IJ SOLN
INTRAMUSCULAR | Status: DC | PRN
  Administered 2017-01-20 (×2): 20 mg via INTRAVENOUS
  Administered 2017-01-20: 10 mg via INTRAVENOUS

## 2017-01-20 MED ORDER — EPINEPHRINE PF 1 MG/ML IJ SOLN
INTRAMUSCULAR | Status: AC
  Filled 2017-01-20: qty 1

## 2017-01-20 MED ORDER — FENTANYL CITRATE (PF) 100 MCG/2ML IJ SOLN
INTRAMUSCULAR | Status: AC
  Filled 2017-01-20: qty 2

## 2017-01-20 MED ORDER — ACETAMINOPHEN 325 MG PO TABS: 650.0000 mg | ORAL_TABLET | Freq: Four times a day (QID) | ORAL | Status: DC | PRN

## 2017-01-20 MED ORDER — FENTANYL CITRATE (PF) 100 MCG/2ML IJ SOLN
INTRAMUSCULAR | Status: DC | PRN
  Administered 2017-01-20 (×2): 25 ug via INTRAVENOUS

## 2017-01-20 MED ORDER — BUPIVACAINE-EPINEPHRINE (PF) 0.5% -1:200000 IJ SOLN
INTRAMUSCULAR | Status: DC | PRN
  Administered 2017-01-20: 25 mL via PERINEURAL

## 2017-01-20 MED ORDER — ONDANSETRON HCL 4 MG/2ML IJ SOLN
INTRAMUSCULAR | Status: AC
  Filled 2017-01-20: qty 2

## 2017-01-20 MED ORDER — CHLORHEXIDINE GLUCONATE 4 % EX LIQD: 60.0000 mL | Freq: Once | CUTANEOUS | Status: DC

## 2017-01-20 MED ORDER — METOPROLOL SUCCINATE ER 25 MG PO TB24
25.0000 mg | ORAL_TABLET | Freq: Two times a day (BID) | ORAL | Status: DC
  Administered 2017-01-20 – 2017-01-21 (×2): 25 mg via ORAL
  Filled 2017-01-20: qty 1

## 2017-01-20 MED ORDER — BUPIVACAINE HCL (PF) 0.25 % IJ SOLN
INTRAMUSCULAR | Status: AC
  Filled 2017-01-20: qty 30

## 2017-01-20 MED ORDER — FENTANYL CITRATE (PF) 100 MCG/2ML IJ SOLN
INTRAMUSCULAR | Status: AC
  Administered 2017-01-20: 50 ug via INTRAVENOUS
  Filled 2017-01-20: qty 2

## 2017-01-20 MED ORDER — SORBITOL 70 % SOLN: 30.0000 mL | Freq: Every day | Status: DC | PRN

## 2017-01-20 SURGICAL SUPPLY — 71 items
BANDAGE ACE 4X5 VEL STRL LF (GAUZE/BANDAGES/DRESSINGS) ×2 IMPLANT
BANDAGE ACE 6X5 VEL STRL LF (GAUZE/BANDAGES/DRESSINGS) ×2 IMPLANT
BANDAGE ESMARK 6X9 LF (GAUZE/BANDAGES/DRESSINGS) IMPLANT
BIT DRILL 2.5X2.75 QC CALB (BIT) ×2 IMPLANT
BIT DRILL CALIBRATED 2.7 (BIT) ×1 IMPLANT
BIT DRILL CALIBRATED 2.7MM (BIT) ×1
BLADE SURG 15 STRL LF DISP TIS (BLADE) ×1 IMPLANT
BLADE SURG 15 STRL SS (BLADE) ×3
BNDG CMPR 9X6 STRL LF SNTH (GAUZE/BANDAGES/DRESSINGS)
BNDG COHESIVE 4X5 TAN STRL (GAUZE/BANDAGES/DRESSINGS) ×3 IMPLANT
BNDG ESMARK 6X9 LF (GAUZE/BANDAGES/DRESSINGS)
CANISTER SUCT 3000ML PPV (MISCELLANEOUS) ×3 IMPLANT
COVER SURGICAL LIGHT HANDLE (MISCELLANEOUS) ×3 IMPLANT
CUFF TOURNIQUET SINGLE 34IN LL (TOURNIQUET CUFF) IMPLANT
CUFF TOURNIQUET SINGLE 44IN (TOURNIQUET CUFF) IMPLANT
DRAPE C-ARM 42X72 X-RAY (DRAPES) ×3 IMPLANT
DRAPE C-ARMOR (DRAPES) ×3 IMPLANT
DRAPE INCISE IOBAN 66X45 STRL (DRAPES) ×2 IMPLANT
DRAPE U-SHAPE 47X51 STRL (DRAPES) ×3 IMPLANT
DURAPREP 26ML APPLICATOR (WOUND CARE) ×3 IMPLANT
ELECT CAUTERY BLADE 6.4 (BLADE) ×3 IMPLANT
ELECT REM PT RETURN 9FT ADLT (ELECTROSURGICAL) ×3
ELECTRODE REM PT RTRN 9FT ADLT (ELECTROSURGICAL) ×1 IMPLANT
FIXATION ZIPTIGHT ANKLE SNDSMS (Ankle) IMPLANT
GAUZE SPONGE 4X4 12PLY STRL (GAUZE/BANDAGES/DRESSINGS) ×3 IMPLANT
GAUZE XEROFORM 5X9 LF (GAUZE/BANDAGES/DRESSINGS) ×3 IMPLANT
GLOVE SKINSENSE NS SZ7.5 (GLOVE) ×2
GLOVE SKINSENSE STRL SZ7.5 (GLOVE) ×1 IMPLANT
GLOVE SURG SYN 7.5  E (GLOVE) ×4
GLOVE SURG SYN 7.5 E (GLOVE) ×2 IMPLANT
GLOVE SURG SYN 7.5 PF PI (GLOVE) ×2 IMPLANT
GOWN STRL REIN XL XLG (GOWN DISPOSABLE) ×3 IMPLANT
K-WIRE ACE 1.6X6 (WIRE) ×6
KIT BASIN OR (CUSTOM PROCEDURE TRAY) ×3 IMPLANT
KIT ROOM TURNOVER OR (KITS) ×3 IMPLANT
KWIRE ACE 1.6X6 (WIRE) IMPLANT
NDL HYPO 25GX1X1/2 BEV (NEEDLE) IMPLANT
NEEDLE 22X1 1/2 (OR ONLY) (NEEDLE) ×2 IMPLANT
NEEDLE HYPO 25GX1X1/2 BEV (NEEDLE) IMPLANT
NS IRRIG 1000ML POUR BTL (IV SOLUTION) ×3 IMPLANT
PACK ORTHO EXTREMITY (CUSTOM PROCEDURE TRAY) ×3 IMPLANT
PAD ARMBOARD 7.5X6 YLW CONV (MISCELLANEOUS) ×6 IMPLANT
PAD CAST 4YDX4 CTTN HI CHSV (CAST SUPPLIES) IMPLANT
PADDING CAST COTTON 4X4 STRL (CAST SUPPLIES) ×3
PADDING CAST COTTON 6X4 STRL (CAST SUPPLIES) ×3 IMPLANT
PLATE LOCK 6H 139 RT DIST FIB (Plate) ×2 IMPLANT
SCREW LOCK 3.5X14 DIST TIB (Screw) ×2 IMPLANT
SCREW LOCK CORT STAR 3.5X12 (Screw) ×2 IMPLANT
SCREW LOCK CORT STAR 3.5X14 (Screw) ×6 IMPLANT
SCREW LOCK CORT STAR 3.5X16 (Screw) ×2 IMPLANT
SCREW LOW PROFILE 18MMX3.5MM (Screw) ×2 IMPLANT
SCREW NON LOCKING LP 3.5 14MM (Screw) ×2 IMPLANT
SCREW NON LOCKING LP 3.5 16MM (Screw) ×4 IMPLANT
SPLINT PLASTER CAST XFAST 5X30 (CAST SUPPLIES) IMPLANT
SPLINT PLASTER XFAST SET 5X30 (CAST SUPPLIES) ×2
SPONGE GAUZE 4X4 12PLY STER LF (GAUZE/BANDAGES/DRESSINGS) ×2 IMPLANT
SUCTION FRAZIER HANDLE 10FR (MISCELLANEOUS) ×2
SUCTION TUBE FRAZIER 10FR DISP (MISCELLANEOUS) ×1 IMPLANT
SUT ETHILON 3 0 PS 1 (SUTURE) ×4 IMPLANT
SUT VIC AB 0 CT1 27 (SUTURE) ×3
SUT VIC AB 0 CT1 27XBRD ANBCTR (SUTURE) IMPLANT
SUT VIC AB 2-0 CT1 27 (SUTURE) ×3
SUT VIC AB 2-0 CT1 TAPERPNT 27 (SUTURE) IMPLANT
SUT VIC AB 3-0 FS2 27 (SUTURE) ×4 IMPLANT
SYR CONTROL 10ML LL (SYRINGE) ×2 IMPLANT
TOWEL OR 17X24 6PK STRL BLUE (TOWEL DISPOSABLE) ×3 IMPLANT
TOWEL OR 17X26 10 PK STRL BLUE (TOWEL DISPOSABLE) ×6 IMPLANT
TUBE CONNECTING 12'X1/4 (SUCTIONS) ×1
TUBE CONNECTING 12X1/4 (SUCTIONS) ×2 IMPLANT
UNDERPAD 30X30 (UNDERPADS AND DIAPERS) ×3 IMPLANT
ZIPTIGHT ANKLE SYNODESMOSS FIX (Ankle) ×3 IMPLANT

## 2017-01-20 NOTE — Transfer of Care (Signed)
Immediate Anesthesia Transfer of Care Note  Patient: Chloe [name]  Procedure(s) Performed: Procedure(s): OPEN REDUCTION INTERNAL FIXATION RIGHT ANKLE (Right)  Patient Location: PACU  Anesthesia Type:General  Level of Consciousness: awake  Airway & Oxygen Therapy: Patient Spontanous Breathing  Post-op Assessment: Report given to RN and Post -op Vital signs reviewed and stable  Post vital signs: Reviewed and stable  Last Vitals:  Vitals:   01/20/17 1305 01/20/17 2043  BP: (!) 142/88 135/65  Pulse: (!) 58 66  Resp: 20 14  Temp: 36.6 C 36.2 C    Last Pain:  Vitals:   01/20/17 2043  TempSrc:   PainSc: 0-No pain      Patients Stated Pain Goal: 0 (01/20/17 1323)  Complications: No apparent anesthesia complications

## 2017-01-20 NOTE — Discharge Instructions (Signed)
°  [facility] [address] [place], [place] [address] [phone]   POSTOPERATIVE DIRECTIONS    Rehabilitation, Guidelines Following Surgery   WEIGHT BEARING Other:  Non weight bearing Right leg.   HOME CARE INSTRUCTIONS  Remove items at home which could result in a fall. This includes throw rugs or furniture in walking pathways.  Continue medications as instructed at time of discharge.  You may have some home medications which will be placed on hold until you complete the course of blood thinner medication. Do not put on socks or shoes without following the instructions of your caregivers.   Sit on chairs with arms. Use the chair arms to help push yourself up when arising.  Arrange for the use of a toilet seat elevator so you are not sitting low.   Walk with walker as instructed.  You may resume a sexual relationship in one month or when given the OK by your caregiver.  Use walker as long as suggested by your caregivers.  Avoid periods of inactivity such as sitting longer than an hour when not asleep. This helps prevent blood clots.  You may return to work once you are cleared by Designer, industrial/productyour surgeon.  Do not drive a car for 6 weeks or until released by your surgeon.  Do not drive while taking narcotics.  Make sure you keep all of your appointments after your operation with all of your doctors and caregivers. You should call the office at the above phone number and make an appointment for approximately two weeks after the date of your surgery. Please pick up a stool softener and laxative for home use as long as you are requiring pain medications. It is important for you to complete the blood thinner medication as prescribed by your doctor.  Continue to use the breathing machine which will help keep your temperature down.  It is common for your temperature to cycle up and down following surgery, especially at night when you are not up moving  around and exerting yourself.  The breathing machine keeps your lungs expanded and your temperature down.   SKILLED REHAB INSTRUCTIONS: If the patient is transferred to a skilled rehab facility following release from the hospital, a list of the current medications will be sent to the facility for the patient to continue.  When discharged from the skilled rehab facility, please have the facility set up the patient's Home Health Physical Therapy prior to being released. Also, the skilled facility will be responsible for providing the patient with their medications at time of release from the facility to include their pain medication and their blood thinner medication. If the patient is still at the rehab facility at time of the two week follow up appointment, the skilled rehab facility will also need to assist the patient in arranging follow up appointment in our office and any transportation needs.  MAKE SURE YOU:  Understand these instructions.  Will watch your condition.  Will get help right away if you are not doing well or get worse.  Pick up stool softner and laxative for home use following surgery while on pain medications. Keep splint clean and dry. Do not remove Elevate toes above nose. Continue to use ice for pain and swelling after surgery. Do not use any lotions or creams on the incision until instructed by your surgeon.

## 2017-01-20 NOTE — Progress Notes (Signed)
Patient and family members updated on delay.

## 2017-01-20 NOTE — Anesthesia Procedure Notes (Signed)
Anesthesia Regional Block:  Popliteal block  Pre-Anesthetic Checklist: ,, timeout performed, Correct Patient, Correct Site, Correct Laterality, Correct Procedure, Correct Position, site marked, Risks and benefits discussed,  Surgical consent,  Pre-op evaluation,  At surgeon's request and post-op pain management  Laterality: Right  Prep: chloraprep       Needles:  Injection technique: Single-shot  Needle Type: Echogenic Stimulator Needle     Needle Length: 9cm 9 cm Needle Gauge: 21 and 21 G    Additional Needles:  Procedures: ultrasound guided (picture in chart) and nerve stimulator Popliteal block  Nerve Stimulator or Paresthesia:  Response: tibial and peroneal responses, 0.5 mA,   Additional Responses:   Narrative:  Start time: 01/20/2017 2:02 PM End time: 01/20/2017 2:12 PM Injection made incrementally with aspirations every 5 mL.  Performed by: Personally  Anesthesiologist: Marcene DuosFITZGERALD, Kimberlin Scheel

## 2017-01-20 NOTE — Brief Op Note (Signed)
01/20/2017  8:17 PM  PATIENT:  Chloe [name]  71 y.o. female  PRE-OPERATIVE DIAGNOSIS:  Right ankle fracture  POST-OPERATIVE DIAGNOSIS:  Right ankle fracture  PROCEDURE:  Procedure(s): OPEN REDUCTION INTERNAL FIXATION RIGHT ANKLE (Right)  SURGEON:  Surgeon(s) and Role:    * Samson FredericBrian Edwards Mckelvie, MD - Primary  PHYSICIAN ASSISTANT: none  ASSISTANTS: Hart CarwinJustin Queen, RNFA   ANESTHESIA:   regional and general  EBL:  Total I/O In: 1000 [I.V.:1000] Out: 50 [Blood:50]  BLOOD ADMINISTERED:none  DRAINS: none   LOCAL MEDICATIONS USED:  MARCAINE     SPECIMEN:  No Specimen  DISPOSITION OF SPECIMEN:  N/A  COUNTS:  YES  TOURNIQUET:   Total Tourniquet Time Documented: Thigh (Right) - 47 minutes Total: Thigh (Right) - 47 minutes   DICTATION: .Other Dictation: Dictation Number [id number]  PLAN OF CARE: Admit to inpatient   PATIENT DISPOSITION:  PACU - hemodynamically stable.   Delay start of Pharmacological VTE agent (>24hrs) due to surgical blood loss or risk of bleeding: not applicable

## 2017-01-20 NOTE — Anesthesia Postprocedure Evaluation (Addendum)
Anesthesia Post Note  Patient: Chloe [name]  Procedure(s) Performed: Procedure(s) (LRB): OPEN REDUCTION INTERNAL FIXATION RIGHT ANKLE (Right)  Patient location during evaluation: PACU Anesthesia Type: General and Regional Level of consciousness: awake, awake and alert and oriented Pain management: pain level controlled Vital Signs Assessment: post-procedure vital signs reviewed and stable Respiratory status: spontaneous breathing, nonlabored ventilation and respiratory function stable Cardiovascular status: blood pressure returned to baseline Anesthetic complications: no       Last Vitals:  Vitals:   01/20/17 2110 01/20/17 2143  BP: 123/64 112/61  Pulse: (!) 58 (!) 59  Resp: 15 (!) 8  Temp:      Last Pain:  Vitals:   01/20/17 2143  TempSrc:   PainSc: 0-No pain                 Railyn House COKER

## 2017-01-20 NOTE — Anesthesia Preprocedure Evaluation (Signed)
Anesthesia Evaluation  Patient identified by MRN, date of birth, ID band Patient awake    Reviewed: Allergy & Precautions, H&P , NPO status , Patient's Chart, lab work & pertinent test results, reviewed documented beta blocker date and time   Airway Mallampati: II   Neck ROM: full    Dental   Pulmonary former smoker,    breath sounds clear to auscultation       Cardiovascular hypertension, Pt. on medications and Pt. on home beta blockers + Peripheral Vascular Disease   Rhythm:regular Rate:Normal     Neuro/Psych  Headaches,  Neuromuscular disease    GI/Hepatic GERD  ,  Endo/Other  diabetes, Type 2Morbid obesity  Renal/GU Renal InsufficiencyRenal disease     Musculoskeletal  (+) Arthritis , Fibromyalgia -  Abdominal   Peds  Hematology   Anesthesia Other Findings   Reproductive/Obstetrics                             Lab Results  Component Value Date   WBC 8.2 01/07/2017   HGB 11.6 (L) 01/07/2017   HCT 36.0 01/07/2017   MCV 90.0 01/07/2017   PLT 158 01/07/2017   Lab Results  Component Value Date   CREATININE 1.30 (H) 01/10/2017   BUN 13 01/10/2017   NA 143 01/10/2017   K 3.2 (L) 01/10/2017   CL 100 (L) 01/10/2017   CO2 33 (H) 01/10/2017    Anesthesia Physical  Anesthesia Plan  ASA: III  Anesthesia Plan: General   Post-op Pain Management:  Regional for Post-op pain   Induction: Intravenous  Airway Management Planned: LMA  Additional Equipment:   Intra-op Plan:   Post-operative Plan: Extubation in OR  Informed Consent: I have reviewed the patients History and Physical, chart, labs and discussed the procedure including the risks, benefits and alternatives for the proposed anesthesia with the patient or authorized representative who has indicated his/her understanding and acceptance.     Plan Discussed with: CRNA  Anesthesia Plan Comments:         Anesthesia  Quick Evaluation

## 2017-01-20 NOTE — Anesthesia Procedure Notes (Signed)
Procedure Name: LMA Insertion Date/Time: 01/20/2017 6:38 PM Performed by: Reine JustFLOWERS, Deanglo Hissong T Pre-anesthesia Checklist: Patient identified, Emergency Drugs available, Suction available, Patient being monitored and Timeout performed Patient Re-evaluated:Patient Re-evaluated prior to inductionOxygen Delivery Method: Circle system utilized Preoxygenation: Pre-oxygenation with 100% oxygen Intubation Type: IV induction Ventilation: Mask ventilation without difficulty LMA: LMA inserted LMA Size: 4.0 Number of attempts: 1 Placement Confirmation: ETT inserted through vocal cords under direct vision,  positive ETCO2 and breath sounds checked- equal and bilateral Tube secured with: Tape Dental Injury: Teeth and Oropharynx as per pre-operative assessment

## 2017-01-20 NOTE — H&P (Signed)
ORTHOPAEDIC H&P  REQUESTING PHYSICIAN: Samson Frederic, MD  PCP:  Marylynn Pearson, MD  Chief Complaint: right trimalleolar equivalent ankle fracture / dislocation  HPI: Chloe [name] is a 71 y.o. female who has a right trimalleolar equivalent ankle fracture/dislocation. She has had an ex-fix placed to maintain reduction, we have been waiting for soft tissue status to improve.  Past Medical History:  Diagnosis Date  . Cervical spondylosis without myelopathy 08/10/2015  . Chronic kidney disease (CKD), stage III (moderate)   . Chronic lower back pain   . Dissection of cerebral artery (HCC) 1997   "no OR"  . Endometriosis   . Family history of adverse reaction to anesthesia    "daughter has PONV"  . Fibromyalgia   . GERD (gastroesophageal reflux disease)   . High cholesterol    hx  . History of hiatal hernia   . Hypertension   . Obesity   . Occipital headache    "qd" (01/10/2017)  . Orthostatic hypotension 02/02/2015  . Plantar fasciitis   . Seasonal asthma   . Shingles   . Stomach ulcer    "I take RX for it" (01/10/2017)  . Stroke [facility]) 1997; 08/2016  . Type II diabetes mellitus (HCC)   . 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 ANKLE CLOSED REDUCTION Right 01/07/2017   Procedure: CLOSED REDUCTION ANKLE;  Surgeon: Samson Frederic, MD;  Location: [facility] OR;  Service: Orthopedics;  Laterality: Right;  . APPENDECTOMY    . CATARACT EXTRACTION W/ INTRAOCULAR LENS  IMPLANT, BILATERAL  03/2014 - 04/2014   left - right   . CLOSED REDUCTION NASAL FRACTURE  1990"s  . EXTERNAL FIXATION LEG Right 01/07/2017   Procedure: EXTERNAL FIXATION LEG;  Surgeon: Samson Frederic, MD;  Location: [facility] OR;  Service: Orthopedics;  Laterality: Right;  . FRACTURE SURGERY    . JOINT REPLACEMENT    . LAPAROSCOPIC CHOLECYSTECTOMY    . SALPINGOOPHORECTOMY  1978  . TONSILLECTOMY    . TOTAL KNEE ARTHROPLASTY Bilateral   . TUBAL LIGATION  1978  .  VAGINAL HYSTERECTOMY  1979   "partial"   Social History   Social History  . Marital status: Widowed    Spouse name: Trey Paula  . Number of children: 4  . Years of education: 13+   Occupational History  . Retired     Social History Main Topics  . Smoking status: Former Smoker    Packs/day: 0.50    Years: 11.00    Types: Cigarettes    Quit date: 47  . Smokeless tobacco: Never Used  . Alcohol use No  . Drug use: No  . Sexual activity: No   Other Topics Concern  . None   Social History Narrative   Patient lives at home with her husband.    Patient is a retired Architectural technologist.    Patient has a high school education and some college.    Patient has four children.   Patient is right handed.   Patient drinks about 2 cups caffeine daily.   Family History  Problem Relation Age of Onset  . Alzheimer's disease Mother   . Congestive Heart Failure Father   . Diabetes Sister   . Hypertension Brother   . Diabetes Brother   . Diabetes Brother    Allergies  Allergen Reactions  . Adhesive [Tape] Hives and Rash  . Codeine Nausea Only   Prior to Admission medications  Medication Sig Start Date End Date Taking? Authorizing Provider  aspirin 81 MG tablet Take 81 mg by mouth daily.   Yes Historical Provider, MD  BIOTIN PO Take 300 mcg by mouth daily.    Yes Historical Provider, MD  CALCIUM PO Take 1,000 mg by mouth 2 (two) times daily.   Yes Historical Provider, MD  Cholecalciferol (VITAMIN D-3) 1000 UNITS CAPS Take 1,000 Units by mouth 3 (three) times daily.   Yes Historical Provider, MD  Cinnamon 500 MG TABS Take 1,000 mg by mouth 2 (two) times daily.    Yes Historical Provider, MD  dicyclomine (BENTYL) 20 MG tablet Take 20 mg by mouth 3 (three) times daily.    Yes Historical Provider, MD  docusate sodium (COLACE) 100 MG capsule Take 1 capsule (100 mg total) by mouth 2 (two) times daily. 01/08/17  Yes Samson FredericBrian Nyron Mozer, MD  DULoxetine (CYMBALTA) 60 MG capsule Take 1 capsule (60 mg  total) by mouth 2 (two) times daily. 08/16/16  Yes York Spanielharles K Willis, MD  enoxaparin (LOVENOX) 30 MG/0.3ML injection Inject 0.3 mLs (30 mg total) into the skin daily. 01/09/17  Yes Samson FredericBrian Kandiss Ihrig, MD  furosemide (LASIX) 40 MG tablet Take 80mg s every morning and 40mg s at 4pm   Yes Historical Provider, MD  gabapentin (NEURONTIN) 400 MG capsule Take 2 caps in the morning, supper time and  Night . Take 3 caps at noon. 08/16/16  Yes York Spanielharles K Willis, MD  HYDROcodone-acetaminophen (NORCO/VICODIN) 5-325 MG tablet Take 1-2 tablets by mouth every 4 (four) hours as needed (breakthrough pain). 01/08/17  Yes Samson FredericBrian Elianie Hubers, MD  insulin glargine (LANTUS) 100 UNIT/ML injection Inject into the skin. 40 units in the am and 10 in the pm   Yes Historical Provider, MD  Insulin Lispro, Human, (HUMALOG [place]) Inject into the skin. 70-150 inject 3 units, 151-200 = 5 units, 201-250 = 7 units, 151-300 = 9 units, 301-350 = 11 units, 351-400 = 13 units, >401 = 16 units and notify Dr   Yes Historical Provider, MD  Methylcellulose, Laxative, (FIBER THERAPY) 500 MG TABS Take 1,000 mg by mouth daily.   Yes Historical Provider, MD  metoprolol succinate (TOPROL XL) 25 MG 24 hr tablet Take 25 mg by mouth 2 (two) times daily.   Yes Historical Provider, MD  Omega-3 Fatty Acids (FISH OIL) 1200 MG CAPS Take 1,200 mg by mouth 3 (three) times daily.   Yes Historical Provider, MD  omeprazole (PRILOSEC) 20 MG capsule Take 20 mg by mouth 2 (two) times daily.    Yes Historical Provider, MD  Psyllium (METAMUCIL PO) Take 2 capsules by mouth daily.   Yes Historical Provider, MD  ramipril (ALTACE) 10 MG capsule Take 10 mg by mouth daily.    Yes Historical Provider, MD  senna (SENOKOT) 8.6 MG TABS tablet Take 1 tablet (8.6 mg total) by mouth 2 (two) times daily. 01/08/17  Yes Samson FredericBrian Martiza Speth, MD  Turmeric 500 MG TABS Take 1,000 mg by mouth daily.   Yes Historical Provider, MD  vitamin E 400 UNIT capsule Take 400 Units by mouth daily.   Yes Historical  Provider, MD  zolpidem (AMBIEN) 10 MG tablet Take 1 tablet (10 mg total) by mouth at bedtime as needed. for sleep 07/16/16  Yes York Spanielharles K Willis, MD   No results found.  Positive ROS: All other systems have been reviewed and were otherwise negative with the exception of those mentioned in the HPI and as above.  Physical Exam: General: Alert, no acute distress Cardiovascular:  No pedal edema Respiratory: No cyanosis, no use of accessory musculature GI: No organomegaly, abdomen is soft and non-tender Skin: No lesions in the area of chief complaint Neurologic: Sensation intact distally Psychiatric: Patient is competent for consent with normal mood and affect Lymphatic: No axillary or cervical lymphadenopathy  MUSCULOSKELETAL: Examination of the right lower extremity reveals that she has an external fixator in place. Pin sites are clean and dry. Overall, her swelling is much improved. She does have skin wrinkling present laterally. On the medial side of the ankle. She does have unstageable pressure ulcer. Her skin blisters are improving. She has a palpable pulse she reports subjective sensory change of the superficial peroneal distribution. She has intact sensation over the remainder of the distributions. She can wiggle her toes.  Assessment: Status post external fixation of right trimalleolar equivalent ankle fracture/dislocation  Plan: I discussed the findings with the patient and her family. Soft tissue status is improved and she is indicated for ORIF of the right ankle. We discussed the risks, benefits, and alternatives. She wishes to proceed.  The risks, benefits, and alternatives were discussed with the patient. There are risks associated with the surgery including, but not limited to, problems with anesthesia (death), infection, differences in leg length/angulation/rotation, fracture of bones, loosening or failure of implants, malunion, nonunion, hematoma (blood accumulation) which may  require surgical drainage, blood clots, pulmonary embolism, nerve injury (foot drop), and blood vessel injury. The patient understands these risks and elects to proceed.    Shain Pauwels, Cloyde Reams, MD Cell [phone]    01/20/2017 2:49 PM

## 2017-01-20 NOTE — Anesthesia Procedure Notes (Signed)
Anesthesia Regional Block:  Femoral nerve block  Pre-Anesthetic Checklist: ,, timeout performed, Correct Patient, Correct Site, Correct Laterality, Correct Procedure, Correct Position, site marked, Risks and benefits discussed,  Surgical consent,  Pre-op evaluation,  At surgeon's request and post-op pain management  Laterality: Right  Prep: chloraprep       Needles:  Injection technique: Single-shot  Needle Type: Echogenic Stimulator Needle     Needle Length: 9cm 9 cm Needle Gauge: 21 and 21 G    Additional Needles:  Procedures: ultrasound guided (picture in chart) and nerve stimulator Femoral nerve block  Nerve Stimulator or Paresthesia:  Response: quadraceps contraction, 0.45 mA,   Additional Responses:   Narrative:  Start time: 01/20/2017 2:13 PM End time: 01/20/2017 2:19 PM Injection made incrementally with aspirations every 5 mL.  Performed by: Personally  Anesthesiologist: Marcene DuosFITZGERALD, Jenevie Casstevens  Additional Notes:

## 2017-01-21 ENCOUNTER — Encounter (HOSPITAL_COMMUNITY): Payer: Self-pay | Admitting: Orthopedic Surgery

## 2017-01-21 DIAGNOSIS — S82851A Displaced trimalleolar fracture of right lower leg, initial encounter for closed fracture: Secondary | ICD-10-CM | POA: Diagnosis not present

## 2017-01-21 LAB — HEMOGLOBIN A1C
HEMOGLOBIN A1C: 5.5 % (ref 4.8–5.6)
MEAN PLASMA GLUCOSE: 111 mg/dL

## 2017-01-21 LAB — GLUCOSE, CAPILLARY
GLUCOSE-CAPILLARY: 107 mg/dL — AB (ref 65–99)
GLUCOSE-CAPILLARY: 105 mg/dL — AB (ref 65–99)
GLUCOSE-CAPILLARY: 148 mg/dL — AB (ref 65–99)

## 2017-01-21 NOTE — Care Management CC44 (Signed)
Condition Code 44 Documentation Completed  Patient Details  Name: Chloe [name] MRN: [id number] Date of Birth: 10/03/1946   Condition Code 44 given:  Yes Patient signature on Condition Code 44 notice:  Yes Documentation of 2 MD's agreement:  Yes Code 44 added to claim:  Yes    Durenda GuthrieBrady, Hazely Sealey Naomi, RN 01/21/2017, 4:59 PM

## 2017-01-21 NOTE — Clinical Social Work Placement (Signed)
   CLINICAL SOCIAL WORK PLACEMENT  NOTE  Date:  01/21/2017  Patient Details  Name: Chloe [name] MRN: [id number] Date of Birth: 11/07/1946  Clinical Social Work is seeking post-discharge placement for this patient at the Skilled  Nursing Facility level of care (*CSW will initial, date and re-position this form in  chart as items are completed):      Patient/family provided with [facility] Clinical Social Work Department's list of facilities offering this level of care within the geographic area requested by the patient (or if unable, by the patient's family).      Patient/family informed of their freedom to choose among providers that offer the needed level of care, that participate in Medicare, Medicaid or managed care program needed by the patient, have an available bed and are willing to accept the patient.      Patient/family informed of [facility]'s ownership interest in [facility] and [facility], as well as of the fact that they are under no obligation to receive care at these facilities.  PASRR submitted to EDS on       PASRR number received on       Existing PASRR number confirmed on 01/21/17     FL2 transmitted to all facilities in geographic area requested by pt/family on 01/21/17     FL2 transmitted to all facilities within larger geographic area on       Patient informed that his/her managed care company has contracts with or will negotiate with certain facilities, including the following:        Yes   Patient/family informed of bed offers received.  Patient chooses bed at [facility] [place]     Physician recommends and patient chooses bed at      Patient to be transferred to [facility] [place] on 01/21/17.  Patient to be transferred to facility by [facility]     Patient family notified on 01/21/17 of transfer.  Name of family member notified:        PHYSICIAN Please prepare priority discharge summary, including medications, Please prepare  prescriptions, Please sign FL2     Additional Comment:    _______________________________________________ Volney AmericanBridget A Mayton, LCSW 01/21/2017, 11:00 AM

## 2017-01-21 NOTE — Op Note (Signed)
NAMEMarland Kitchen  Chloe [name], Chloe [name] NO.:  [id number]  MEDICAL RECORD NO.:  [id number]  LOCATION:                                 FACILITY:  PHYSICIAN:  Chloe Frederic, MD     DATE OF BIRTH:  May 10, 1946  DATE OF PROCEDURE:  01/20/2017 DATE OF DISCHARGE:  01/21/2017                              OPERATIVE REPORT   SURGEON:  Chloe Frederic, MD.  ASSISTANT:  Hart Carwin, RNFA.  PREOPERATIVE DIAGNOSIS:  Right trimalleolar equivalent ankle fracture status post external fixator placement.  POSTOPERATIVE DIAGNOSIS:  Right trimalleolar equivalent ankle fracture status post external fixator placement.  PROCEDURE PERFORMED: 1. Removal of right lower extremity external fixator. 2. Open reduction and internal fixation of right lateral malleolus. 3. Open treatment of syndesmotic injury.  IMPLANTS: 1. Biomet ALPS anatomic fibular locking plate 6-hole with 3.5 mm     screws. 2. ZipTight syndesmotic implant x1.  EXPLANTS:  Zimmer lower extremity ex-fix.  ANTIBIOTICS:  2 g Ancef.  COMPLICATIONS:  None.  SPECIMENS:  None.  TUBES AND DRAINS:  None.  DISPOSITION:  Stable to PACU.  INDICATIONS:  The patient is a 71 year old female, known to me for previous external fixator placement for an extremely unstable right ankle fracture.  We put on a temporary fixator in order to allow the soft tissues to improve prior to proceeding with open reduction and internal fixation.  She has been nonweightbearing in a nursing facility. She has been receiving Lovenox for DVT prophylaxis.  She presents today for planned external fixator removal and ORIF of the ankle.  Risks, benefits, and alternatives were explained.  She elected to proceed.  DESCRIPTION OF PROCEDURE IN DETAIL:  I identified the patient in the holding area using 2 identifiers.  Regional block was placed by Anesthesia.  She was taken to the operating room.  General anesthesia was induced.  She was positioned on the operating  room table.  All bony prominences were all padded.  Nonsterile tourniquet was applied to the thigh.  Right lower extremity was prepped and draped in normal sterile surgical fashion.  I was called verifying side and site of surgery.  The external fixator was removed.  All pins were removed.  I then made a standard longitudinal incision over the distal fibula.  Distally, the dissection was carried sharply down to bone.  Proximally, careful blunt dissection was performed in order to protect the superficial peroneal nerve.  The superficial peroneal nerve was identified and protected throughout the case.  Periosteal elevator was used.  The fracture was identified.  She had a Weber C ankle fracture.  She also had a nondisplaced Weber A ankle fracture as well.  I cleaned the fracture site with a curette and a rongeur.  Reduction clamps were used to reduce the fracture.  I then pinned the reduction with a K-wire.  I selected a 6-hole plate.  The plate was pinned into place on the distal fibula.  I then placed a total of 3 bicortical screws proximally in the shaft.  I then placed a total of 5 locking screws distally after first applying a bicortical compression screw which was then later switched to a locking screw.  I then obtained AP, lateral, and mortise views.  The fracture was reduced nearly anatomically.  With stressing, she did have lateral shifting of the talus indicating a syndesmotic injury.  I then placed a guide pin tetra-cortically for a ZipTight.  I drilled, I then placed a real implant.  The foot was held in maximum dorsiflexion.  While I tightened the ZipTight, I clipped the sutures.  I repeated the stress test, the mortise was now extremely stable.  The wounds were copiously irrigated with saline.  I let down the tourniquet.  I closed the fascia with 2-0 interrupted Vicryl, deep dermal layer with 3-0 interrupted Vicryl and then 3-0 nylon for the skin with modified  Allgower-Donati technique.  Pin sites in the medial skin including the sites of the previous fracture blisters were dressed with Xeroform, 4x4s, ABD, then a posterior splint with U was applied.  The patient understands that she had prolonged pressure over the skin of the medial ankle, she may have some necrosis of this area.  Right now, things are healing and improving as expected.  Postoperatively, we will admit the patient to the hospital.  She will be nonweightbearing right lower extremity, Occupational Physical Therapy.  IV antibiotics 23 hours.  Lovenox for DVT prophylaxis.  DISPOSITION PLANNING:  I will see her in the office 2 weeks after discharge.    ______________________________ Chloe FredericBrian Novak Stgermaine, MD   ______________________________ Chloe FredericBrian Sangeeta Youse, MD    BS/MEDQ  D:  01/20/2017  T:  01/21/2017  Job:  [id number]

## 2017-01-21 NOTE — Op Note (Deleted)
  The note originally documented on this encounter has been moved the the encounter in which it belongs.  

## 2017-01-21 NOTE — Clinical Social Work Note (Signed)
Clinical Social Worker facilitated patient discharge including contacting patient family and facility to confirm patient discharge plans.  Clinical information faxed to facility and family agreeable with plan.  CSW arranged ambulance transport via [facility] to [facility] .  RN to call [phone] for report prior to discharge. Patient will be going to room 203.  Clinical Social Worker will sign off for now as social work intervention is no longer needed. Please consult us again if new need arises.  [address]Bridget [name], Connecticut[place]CSWA [phone]

## 2017-01-21 NOTE — Progress Notes (Signed)
PT Cancellation Note  Patient Details Name: Chloe KaysSanta M Beam MRN: [id number] DOB: 09/10/1946   Cancelled Treatment:    Reason Eval/Treat Not Completed: Patient declined, no reason specified (sitting EOB upon entering room, anticipate return to SNF pm) She anticipated leaving within half hour.  Family present.  Pt denies need for full PT eval as she has discharge plans in place and she will resume skilled PT at [facility]. Discussed with nurse and case manager who agreed formal PT not warranted at this time.  Pt also moving Rt leg freely while in sitting.  Stephanie AcreKristen M TekonshaSoth, PT [phone]  Aleane Wesenberg 01/21/2017, 4:02 PM

## 2017-01-21 NOTE — Care Management Obs Status (Signed)
MEDICARE OBSERVATION STATUS NOTIFICATION   Patient Details  Name: Chloe [name] MRN: [id number] Date of Birth: 08/25/1946   Medicare Observation Status Notification Given:  Yes    Durenda GuthrieBrady, Jahvon Gosline Naomi, RN 01/21/2017, 4:58 PM

## 2017-01-21 NOTE — Progress Notes (Signed)
   Subjective:  Patient reports pain as mild to moderate.  Denies N/V/CP/SOB.  Objective:   VITALS:   Vitals:   01/20/17 2143 01/20/17 2231 01/20/17 2240 01/21/17 0535  BP: 112/61  (!) 122/53 (!) 109/49  Pulse: (!) 59  (!) 59 63  Resp: (!) 8  16 15   Temp:   97.3 F (36.3 C) 98.2 F (36.8 C)  TempSrc:   Oral Oral  SpO2: 97% 97% 98% 99%    NAD ABD soft Sensation intact distally splint c/d/i Wiggles toes   Lab Results  Component Value Date   WBC 6.5 01/20/2017   HGB 12.2 01/20/2017   HCT 39.1 01/20/2017   MCV 92.4 01/20/2017   PLT 201 01/20/2017   BMET    Component Value Date/Time   NA 142 01/20/2017 1312   K 3.8 01/20/2017 1312   CL 101 01/20/2017 1312   CO2 29 01/20/2017 1312   GLUCOSE 108 (H) 01/20/2017 1312   BUN 20 01/20/2017 1312   CREATININE 1.30 (H) 01/20/2017 2243   CALCIUM 9.9 01/20/2017 1312   GFRNONAA 41 (L) 01/20/2017 2243   GFRAA 48 (L) 01/20/2017 2243     Assessment/Plan: 1 Day Post-Op   Active Problems:   Closed fracture dislocation of right ankle    NWB RLE Ice, elevation Lovenox PT/OT glucose control Dispo: D/C to SNF tomorrow   Aking Klabunde, Cloyde ReamsBrian James 01/21/2017, 7:39 AM   Samson FredericBrian Yatzari Jonsson, MD Cell [phone]

## 2017-01-21 NOTE — Discharge Summary (Signed)
Physician Discharge Summary  Patient ID: Chloe [name] MRN: [id number] DOB/AGE: 69-Sep-1947 69 y.o.  Admit date: 01/20/2017 Discharge date: 01/21/2017  Admission Diagnoses:  Closed fracture dislocation of right ankle  Discharge Diagnoses:  Principal Problem:   Closed fracture dislocation of right ankle   Past Medical History:  Diagnosis Date  . Cervical spondylosis without myelopathy 08/10/2015  . Chronic kidney disease (CKD), stage III (moderate)   . Chronic lower back pain   . Dissection of cerebral artery (HCC) 1997   "no OR"  . Endometriosis   . Family history of adverse reaction to anesthesia    "daughter has PONV"  . Fibromyalgia   . GERD (gastroesophageal reflux disease)   . High cholesterol    hx  . History of hiatal hernia   . Hypertension   . Obesity   . Occipital headache    "qd" (01/10/2017)  . Orthostatic hypotension 02/02/2015  . Plantar fasciitis   . Seasonal asthma   . Shingles   . Stomach ulcer    "I take RX for it" (01/10/2017)  . Stroke [facility]) 1997; 08/2016  . Type II diabetes mellitus (HCC)   . Vertebral artery dissection (HCC)    right     Surgeries: Procedure(s): OPEN REDUCTION INTERNAL FIXATION RIGHT ANKLE on 01/20/2017   Consultants (if any):   Discharged Condition: Improved  Hospital Course: Chloe [name] is an 71 y.o. female who was admitted 01/20/2017 with a diagnosis of Closed fracture dislocation of right ankle and went to the operating room on 01/20/2017 and underwent the above named procedures.    She was given perioperative antibiotics:  Anti-infectives    Start     Dose/Rate Route Frequency Ordered Stop   01/21/17 0200  ceFAZolin (ANCEF) IVPB 2g/100 mL premix     2 g 200 mL/hr over 30 Minutes Intravenous Every 6 hours 01/20/17 2230 01/21/17 1959   01/20/17 1400  ceFAZolin (ANCEF) 3 g in dextrose 5 % 50 mL IVPB     3 g 130 mL/hr over 30 Minutes Intravenous To [facility] 01/17/17 1445 01/20/17 1853    .  She was given  sequential compression devices and lovenox for DVT prophylaxis.  She was NWB RLE.  She benefited maximally from the hospital stay and there were no complications.    Recent vital signs:  Vitals:   01/21/17 1000 01/21/17 1034  BP: (!) 99/44   Pulse: 63   Resp:  16  Temp: 98.7 F (37.1 C)     Recent laboratory studies:  Lab Results  Component Value Date   HGB 12.2 01/20/2017   HGB 14.2 01/20/2017   HGB 11.6 (L) 01/07/2017   Lab Results  Component Value Date   WBC 6.5 01/20/2017   PLT 201 01/20/2017   Lab Results  Component Value Date   INR 1.8 (H) 06/15/2007   Lab Results  Component Value Date   NA 142 01/20/2017   K 3.8 01/20/2017   CL 101 01/20/2017   CO2 29 01/20/2017   BUN 20 01/20/2017   CREATININE 1.30 (H) 01/20/2017   GLUCOSE 108 (H) 01/20/2017    Discharge Medications:   Allergies as of 01/21/2017      Reactions   Adhesive [tape] Hives, Rash   Codeine Nausea Only      Medication List    TAKE these medications   aspirin 81 MG tablet Take 81 mg by mouth daily.   BIOTIN PO Take 300 mcg by mouth daily.   CALCIUM  PO Take 1,000 mg by mouth 2 (two) times daily.   Cinnamon 500 MG Tabs Take 1,000 mg by mouth 2 (two) times daily.   dicyclomine 20 MG tablet Commonly known as:  BENTYL Take 20 mg by mouth 3 (three) times daily.   docusate sodium 100 MG capsule Commonly known as:  COLACE Take 1 capsule (100 mg total) by mouth 2 (two) times daily.   DULoxetine 60 MG capsule Commonly known as:  CYMBALTA Take 1 capsule (60 mg total) by mouth 2 (two) times daily.   enoxaparin 30 MG/0.3ML injection Commonly known as:  LOVENOX Inject 0.3 mLs (30 mg total) into the skin daily.   FIBER THERAPY 500 MG Tabs Generic drug:  Methylcellulose (Laxative) Take 1,000 mg by mouth daily.   Fish Oil 1200 MG Caps Take 1,200 mg by mouth 3 (three) times daily.   furosemide 40 MG tablet Commonly known as:  LASIX Take 80mg s every morning and 40mg s at 4pm    gabapentin 400 MG capsule Commonly known as:  NEURONTIN Take 2 caps in the morning, supper time and  Night . Take 3 caps at noon.   HUMALOG Kittrell Inject into the skin. 70-150 inject 3 units, 151-200 = 5 units, 201-250 = 7 units, 151-300 = 9 units, 301-350 = 11 units, 351-400 = 13 units, >401 = 16 units and notify Dr   HYDROcodone-acetaminophen 5-325 MG tablet Commonly known as:  NORCO/VICODIN Take 1-2 tablets by mouth every 4 (four) hours as needed (breakthrough pain).   LANTUS 100 UNIT/ML injection Generic drug:  insulin glargine Inject into the skin. 40 units in the am and 10 in the pm   METAMUCIL PO Take 2 capsules by mouth daily.   PRILOSEC 20 MG capsule Generic drug:  omeprazole Take 20 mg by mouth 2 (two) times daily.   ramipril 10 MG capsule Commonly known as:  ALTACE Take 10 mg by mouth daily.   senna 8.6 MG Tabs tablet Commonly known as:  SENOKOT Take 1 tablet (8.6 mg total) by mouth 2 (two) times daily.   TOPROL XL 25 MG 24 hr tablet Generic drug:  metoprolol succinate Take 25 mg by mouth 2 (two) times daily.   Turmeric 500 MG Tabs Take 1,000 mg by mouth daily.   Vitamin D-3 1000 units Caps Take 1,000 Units by mouth 3 (three) times daily.   vitamin E 400 UNIT capsule Take 400 Units by mouth daily.   zolpidem 10 MG tablet Commonly known as:  AMBIEN Take 1 tablet (10 mg total) by mouth at bedtime as needed. for sleep       Diagnostic Studies: Dg Ankle Complete Right  Result Date: 01/20/2017 CLINICAL DATA:  Ankle fractures, operative reduction EXAM: DG C-ARM 61-120 MIN; RIGHT ANKLE - COMPLETE 3+ VIEW COMPARISON:  01/07/2017, 01/04/2017 FINDINGS: Distal fibula plate screw fixation reducing the fibula fracture with anatomic alignment. Radiopaque linear hardware also noted along the medial malleolus. Normal ankle alignment. Diffuse soft tissue swelling. IMPRESSION: Limited intraoperative imaging during right ankle fracture ORIF with improved anatomic alignment.  No complicating feature. Electronically Signed   By: Judie PetitM.  Shick M.D.   On: 01/20/2017 20:46   Dg Ankle Complete Right  Result Date: 01/07/2017 CLINICAL DATA:  71 year old female with severe right ankle fracture. External fixator placement. Initial encounter. EXAM: RIGHT ANKLE - COMPLETE 3+ VIEW; DG C-ARM 61-120 MIN COMPARISON:  01/04/2017. FLUOROSCOPY TIME:  1 minute 14 seconds FINDINGS: Four intraoperative images demonstrate external fixator placement about the right ankle fracture with  improved mortise joint alignment. IMPRESSION: Partially visible right lower extremity external fixator. Improved right mortise joint alignment. Electronically Signed   By: Odessa Fleming M.D.   On: 01/07/2017 20:56   Ct Ankle Right Wo Contrast  Result Date: 01/07/2017 CLINICAL DATA:  ORIF of right ankle fracture EXAM: CT OF THE RIGHT ANKLE WITHOUT CONTRAST TECHNIQUE: Multidetector CT imaging of the right ankle was performed according to the standard protocol. Multiplanar CT image reconstructions were also generated. COMPARISON:  Preoperative radiographs from 01/04/2017 of the right ankle. FINDINGS: Bones/Joint/Cartilage Comminuted fracture of the distal fibular shaft involving the distal diaphysis and the diametaphysis of the fibula. There is 1/4 shaft with lateral and 1/2 shaft with dorsal displacement at the site of proximal fibular fracture. A 1 cm in length dorsal butterfly fragment off the dorsal aspect of the fibula is seen at the site of proximal fracture. This too is slightly displaced dorsally by approximately 3 mm. Posterior malleolar fracture with 3 mm of posterior displacement is seen off the distal tibia. The medial malleolus which was approximately 17 mm subluxed in the medial direction on plain films has been relocated. The medial aspect of the ankle mortise is now approximately 6 mm as seen on image 41/11. Tiny flakes of bone are seen within the ankle joint laterally between the talus and distal fibula, image 38/11  measuring 5 x 2 mm, at least 3 adjacent to the anterolateral corner of the ankle joint the largest component measuring approximately 7 x 2 mm, image 39/11. A few scattered fracture fragments are seen adjacent to the lateral aspect of the talar dome the largest component measuring 7 mm in length, image 45/11. Ligaments Suboptimally assessed by CT. Muscles and Tendons Posttraumatic edema and induration of the soft tissues and surrounding musculature. Tendons are difficult to definitively follow up. No definite rupture. Soft tissues Diffuse soft tissue swelling and edema postop and posttraumatic in etiology. No hematoma or abnormal fluid collections. Fixation screw traverses the posterior calcaneus transversely. IMPRESSION: Reduction of fracture dislocation of the right ankle with decrease in widening of the medial aspect of the ankle mortise now demonstrated. A few tiny bony fragments are seen in and about the ankle joint. Comminuted fracture of the distal fibula with minimal displacement, but improved since preoperative imaging. Nondisplaced posterior malleolar fracture. Electronically Signed   By: Tollie Eth M.D.   On: 01/07/2017 23:28   Dg Ankle Right Port  Result Date: 01/20/2017 CLINICAL DATA:  Postop check. EXAM: PORTABLE RIGHT ANKLE - 2 VIEW COMPARISON:  Plain film of the right ankle dated 01/04/2017. FINDINGS: Interval plate and screw fixation of the distal right fibula, traversing the previously demonstrated fibula fracture. Alignment is now anatomic. Plate and screw fixation hardware appears intact and well positioned. The radiodense linear hardware overlying the medial malleolus is stable in position. Distal tibia is normally aligned. Ankle mortise is symmetric. IMPRESSION: Plate and screw fixation hardware traversing the distal fibula fracture site. Hardware appears intact and appropriately positioned. Osseous alignment is anatomic. No evidence of surgical complicating feature. Electronically Signed    By: Bary Richard M.D.   On: 01/20/2017 22:08   Dg Ankle Right Port  Result Date: 01/07/2017 CLINICAL DATA:  71 year old female status post external fixator placement about right lower extremity fracture. Initial encounter. EXAM: PORTABLE RIGHT ANKLE - 2 VIEW COMPARISON:  Intraoperative images from 1940 hours today reported separately. Right ankle series [address]. FINDINGS: Portable AP and cross-table lateral views of the distal right lower extremity demonstrate an external  fixation device anchored proximally to the right tibial shaft and distally to the calcaneus. Improved and near anatomic alignment about the right mortise joint following trimalleolar type right ankle fracture (fibula fracture located at the distal shaft). The hardware appears intact. No new osseous abnormality identified. IMPRESSION: No adverse features status post right lower extremity external fixator device placement. Improved and near anatomic alignment about the right ankle. Electronically Signed   By: Odessa Fleming M.D.   On: 01/07/2017 20:55   Dg C-arm 1-60 Min  Result Date: 01/20/2017 CLINICAL DATA:  Ankle fractures, operative reduction EXAM: DG C-ARM 61-120 MIN; RIGHT ANKLE - COMPLETE 3+ VIEW COMPARISON:  01/07/2017, 01/04/2017 FINDINGS: Distal fibula plate screw fixation reducing the fibula fracture with anatomic alignment. Radiopaque linear hardware also noted along the medial malleolus. Normal ankle alignment. Diffuse soft tissue swelling. IMPRESSION: Limited intraoperative imaging during right ankle fracture ORIF with improved anatomic alignment. No complicating feature. Electronically Signed   By: Judie Petit.  Shick M.D.   On: 01/20/2017 20:46   Dg C-arm 1-60 Min  Result Date: 01/07/2017 CLINICAL DATA:  70 year old female with severe right ankle fracture. External fixator placement. Initial encounter. EXAM: RIGHT ANKLE - COMPLETE 3+ VIEW; DG C-ARM 61-120 MIN COMPARISON:  01/04/2017. FLUOROSCOPY TIME:  1 minute 14 seconds FINDINGS: Four  intraoperative images demonstrate external fixator placement about the right ankle fracture with improved mortise joint alignment. IMPRESSION: Partially visible right lower extremity external fixator. Improved right mortise joint alignment. Electronically Signed   By: Odessa Fleming M.D.   On: 01/07/2017 20:56    Disposition: 03-Skilled Nursing Facility  Discharge Instructions    Call MD / Call 911    Complete by:  As directed    If you experience chest pain or shortness of breath, CALL 911 and be transported to the hospital emergency room.  If you develope a fever above 101 F, pus (white drainage) or increased drainage or redness at the wound, or calf pain, call your surgeon's office.   Constipation Prevention    Complete by:  As directed    Drink plenty of fluids.  Prune juice may be helpful.  You may use a stool softener, such as Colace (over the counter) 100 mg twice a day.  Use MiraLax (over the counter) for constipation as needed.   Diet - low sodium heart healthy    Complete by:  As directed    Discharge instructions    Complete by:  As directed    NWB RLE Keep splint clean and dry Elevate toes above nose Lovenox for 30 days postoperatively   Driving restrictions    Complete by:  As directed    No driving for 8 weeks   Increase activity slowly as tolerated    Complete by:  As directed    Lifting restrictions    Complete by:  As directed    No lifting for 8 weeks      Follow-up Information    Dema Timmons, Cloyde Reams, MD. Schedule an appointment as soon as possible for a visit in 2 weeks.   Specialty:  Orthopedic Surgery Why:  For wound re-check, For suture removal Contact information: [address] [place] [address] [phone]            Signed: Garnet Koyanagi 01/21/2017, 11:54 AM

## 2017-01-21 NOTE — Plan of Care (Signed)
Problem: Safety: Goal: Ability to remain free from injury will improve Outcome: Progressing No safety issues noted, safety precautions maintained  Problem: Pain Managment: Goal: General experience of comfort will improve Outcome: Progressing Medicated once for pain with full relief  Problem: Physical Regulation: Goal: Will remain free from infection Outcome: Progressing No signs of infection noted  Problem: Tissue Perfusion: Goal: Risk factors for ineffective tissue perfusion will decrease Outcome: Progressing SCD on to the left leg, patient is on Lovenox   Problem: Bowel/Gastric: Goal: Will not experience complications related to bowel motility Outcome: Progressing Denies gastric and bowel issues

## 2017-01-21 NOTE — Clinical Social Work Note (Addendum)
Pt can return to [facility] today. MD please provide d/c summary and scripts once medically cleared.  [address] StreetBridget Mayton, [place]CSWA [phone]

## 2017-01-21 NOTE — [place] FL2 (Signed)
[place] MEDICAID FL2 LEVEL OF CARE SCREENING TOOL     IDENTIFICATION  Patient Name: Chloe [name] Birthdate: 1946-09-07 Sex: female Admission Date (Current Location): 01/20/2017  [facility] and [place] Number:  Producer, television/film/video and Address:  [facility] [place], [address] [place], [place] [address]      Provider Number: [id number]  Attending Physician Name and Address:  Samson Frederic, MD  Relative Name and Phone Number:       Current Level of Care: Hospital Recommended Level of Care: Skilled Nursing Facility Prior Approval Number:    Date Approved/Denied:   PASRR Number: [id number]  Discharge Plan: SNF    Current Diagnoses: Patient Active Problem List   Diagnosis Date Noted  . Closed fracture dislocation of right ankle 01/07/2017  . Cervical spondylosis without myelopathy 08/10/2015  . Orthostatic hypotension 02/02/2015  . Headache 08/16/2013  . Myalgia and myositis 08/16/2013  . Syncope and collapse 08/16/2013    Orientation RESPIRATION BLADDER Height & Weight     Self, Time, Situation, Place  O2 (Nasal Cannula, 2L) Continent Weight:   Height:     BEHAVIORAL SYMPTOMS/MOOD NEUROLOGICAL BOWEL NUTRITION STATUS      Continent  (Please see d/c summary)  AMBULATORY STATUS COMMUNICATION OF NEEDS Skin   Extensive Assist Verbally Surgical wounds (Closed incision right leg, Compresison wrapped, ace splint. )                       Personal Care Assistance Level of Assistance  Bathing, Feeding, Dressing Bathing Assistance: Maximum assistance Feeding assistance: Limited assistance Dressing Assistance: Maximum assistance     Functional Limitations Info  Sight, Hearing, Speech Sight Info: Adequate Hearing Info: Adequate Speech Info: Adequate    SPECIAL CARE FACTORS FREQUENCY  PT (By licensed PT), OT (By licensed OT)     PT Frequency: 3x week OT Frequency: 3x week            Contractures Contractures Info: Not present     Additional Factors Info  Code Status, Allergies Code Status Info: Full Code Allergies Info: Adhesive (tape), Codeine           Current Medications (01/21/2017):  This is the current hospital active medication list Current Facility-Administered Medications  Medication Dose Route Frequency Provider Last Rate Last Dose  . 0.9 %  sodium chloride infusion   Intravenous Continuous Samson Frederic, MD 50 mL/hr at 01/20/17 2328    . acetaminophen (TYLENOL) tablet 650 mg  650 mg Oral Q6H PRN Samson Frederic, MD       Or  . acetaminophen (TYLENOL) suppository 650 mg  650 mg Rectal Q6H PRN Samson Frederic, MD      . ceFAZolin (ANCEF) IVPB 2g/100 mL premix  2 g Intravenous Q6H Samson Frederic, MD   2 g at 01/21/17 0900  . cholecalciferol (VITAMIN D) tablet 1,000 Units  1,000 Units Oral TID Samson Frederic, MD   1,000 Units at 01/21/17 0919  . dicyclomine (BENTYL) tablet 20 mg  20 mg Oral TID Samson Frederic, MD   20 mg at 01/21/17 0347  . docusate sodium (COLACE) capsule 100 mg  100 mg Oral BID Samson Frederic, MD   100 mg at 01/21/17 4259  . DULoxetine (CYMBALTA) DR capsule 60 mg  60 mg Oral BID Samson Frederic, MD   60 mg at 01/21/17 0920  . enoxaparin (LOVENOX) injection 30 mg  30 mg Subcutaneous Q24H Samson Frederic, MD   30 mg at 01/21/17  [id number]  . furosemide (LASIX) tablet 40 mg  40 mg Oral Daily Samson FredericBrian Marybella Ethier, MD      . furosemide (LASIX) tablet 80 mg  80 mg Oral QAC breakfast Samson FredericBrian Dorance Spink, MD   80 mg at 01/21/17 0919  . gabapentin (NEURONTIN) capsule 1,200 mg  1,200 mg Oral Q1200 Samson FredericBrian Tyrena Gohr, MD      . gabapentin (NEURONTIN) capsule 800 mg  800 mg Oral 3 times per day Samson FredericBrian Salena Ortlieb, MD   800 mg at 01/21/17 0919  . HYDROcodone-acetaminophen (NORCO/VICODIN) 5-325 MG per tablet 1-2 tablet  1-2 tablet Oral Q4H PRN Samson FredericBrian Haadiya Frogge, MD   2 tablet at 01/21/17 [phone]  . HYDROmorphone (DILAUDID) injection 0.5-1 mg  0.5-1 mg Intravenous Q2H PRN Samson FredericBrian [place] Silveria, MD      . insulin aspart (novoLOG) injection  0-15 Units  0-15 Units Subcutaneous TID WC Samson FredericBrian Allyssia Skluzacek, MD      . insulin glargine (LANTUS) injection 10 Units  10 Units Subcutaneous Q2200 Samson FredericBrian Chanese Hartsough, MD      . insulin glargine (LANTUS) injection 40 Units  40 Units Subcutaneous Daily Samson FredericBrian Adylin Hankey, MD   40 Units at 01/21/17 [phone]  . magnesium citrate solution 1 Bottle  1 Bottle Oral Once PRN Samson FredericBrian Dub Maclellan, MD      . methocarbamol (ROBAXIN) tablet 500 mg  500 mg Oral Q6H PRN Samson FredericBrian Toniya Rozar, MD   500 mg at 01/21/17 [id number]   Or  . methocarbamol (ROBAXIN) 500 mg in dextrose 5 % 50 mL IVPB  500 mg Intravenous Q6H PRN Samson FredericBrian Kirstin Kugler, MD      . metoCLOPramide (REGLAN) tablet 5-10 mg  5-10 mg Oral Q8H PRN Samson FredericBrian Mahi Zabriskie, MD       Or  . metoCLOPramide (REGLAN) injection 5-10 mg  5-10 mg Intravenous Q8H PRN Samson FredericBrian Gatlin Kittell, MD      . metoprolol succinate (TOPROL-XL) 24 hr tablet 25 mg  25 mg Oral BID Samson FredericBrian Dijon Kohlman, MD   25 mg at 01/21/17 [id number]  . ondansetron (ZOFRAN) tablet 4 mg  4 mg Oral Q6H PRN Samson FredericBrian Suanne Minahan, MD       Or  . ondansetron [facility](ZOFRAN) injection 4 mg  4 mg Intravenous Q6H PRN Samson FredericBrian Donnel Venuto, MD      . pantoprazole (PROTONIX) EC tablet 40 mg  40 mg Oral Daily Samson FredericBrian Roney Youtz, MD   40 mg at 01/21/17 [id number]  . psyllium (HYDROCIL/METAMUCIL) packet 1 packet  1 packet Oral Daily Samson FredericBrian Corinne Goucher, MD   1 packet at 01/21/17 1000  . ramipril (ALTACE) capsule 10 mg  10 mg Oral Daily Samson FredericBrian Makylah Bossard, MD   10 mg at 01/21/17 0919  . senna (SENOKOT) tablet 8.6 mg  1 tablet Oral BID Samson FredericBrian Maxim Bedel, MD   8.6 mg at 01/21/17 [id number]  . sorbitol 70 % solution 30 mL  30 mL Oral Daily PRN Samson FredericBrian Aydenn Gervin, MD      . zolpidem (AMBIEN) tablet 5 mg  5 mg Oral QHS PRN Samson FredericBrian Mandela Bello, MD   5 mg at 01/20/17 2344     Discharge Medications: Please see discharge summary for a list of discharge medications.  Relevant Imaging Results:  Relevant Lab Results:   Additional Information SSN: [id number]  [place] AmericanBridget A Mayton, LCSW

## 2017-02-19 ENCOUNTER — Encounter: Payer: Self-pay | Admitting: Neurology

## 2017-02-19 ENCOUNTER — Ambulatory Visit (INDEPENDENT_AMBULATORY_CARE_PROVIDER_SITE_OTHER): Payer: Medicare Other | Admitting: Neurology

## 2017-02-19 DIAGNOSIS — M545 Low back pain, unspecified: Secondary | ICD-10-CM

## 2017-02-19 DIAGNOSIS — G8929 Other chronic pain: Secondary | ICD-10-CM

## 2017-02-19 HISTORY — DX: Low back pain, unspecified: M54.50

## 2017-02-19 MED ORDER — ZOLPIDEM TARTRATE 10 MG PO TABS: 10.0000 mg | ORAL_TABLET | Freq: Every evening | ORAL | 2 refills | Status: DC | PRN

## 2017-02-19 MED ORDER — GABAPENTIN 400 MG PO CAPS: ORAL_CAPSULE | ORAL | 3 refills | Status: DC

## 2017-02-19 MED ORDER — DULOXETINE HCL 60 MG PO CPEP: 60.0000 mg | ORAL_CAPSULE | Freq: Two times a day (BID) | ORAL | 3 refills | Status: DC

## 2017-02-19 NOTE — Progress Notes (Signed)
Reason for visit: Chronic low back pain  Chloe [name] is an 71 y.o. female  History of present illness:  Chloe [name] is a 71 year old right-handed white female with a history of significant obesity, chronic low back pain, and a history of cervical spondylosis. The patient has been on Cymbalta and gabapentin for symptoms of fibromyalgia and of the low back pain. The patient unfortunately has had a fall that occurred in January 2018. The patient had a severe fracture of the tibia and fibula of the right leg, also had a more minor fracture of the left leg. The patient has had an apparent stroke evaluation in September 2017 for left leg weakness. The patient still feels that the left leg is somewhat weak. The patient reports pain with flexion at the hip on the left. When she is up walking, the pain seems to go away. The patient returns to the office today for an evaluation. She is still not able to bear weight following the right leg fracture.  Past Medical History:  Diagnosis Date  . Cervical spondylosis without myelopathy 08/10/2015  . Chronic kidney disease (CKD), stage III (moderate)   . Chronic low back pain 02/19/2017  . Chronic lower back pain   . Dissection of cerebral artery (HCC) 1997   "no OR"  . Endometriosis   . Family history of adverse reaction to anesthesia    "daughter has PONV"  . Fibromyalgia   . GERD (gastroesophageal reflux disease)   . High cholesterol    hx  . History of hiatal hernia   . Hypertension   . Obesity   . Occipital headache    "qd" (01/10/2017)  . Orthostatic hypotension 02/02/2015  . Plantar fasciitis   . Seasonal asthma   . Shingles   . Stomach ulcer    "I take RX for it" (01/10/2017)  . Stroke [facility]) 1997; 08/2016  . Type II diabetes mellitus (HCC)   . 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 ANKLE CLOSED REDUCTION Right 01/07/2017   Procedure: CLOSED REDUCTION ANKLE;  Surgeon:  Samson Frederic, MD;  Location: [facility] OR;  Service: Orthopedics;  Laterality: Right;  . APPENDECTOMY    . CATARACT EXTRACTION W/ INTRAOCULAR LENS  IMPLANT, BILATERAL  03/2014 - 04/2014   left - right   . CLOSED REDUCTION NASAL FRACTURE  1990"s  . EXTERNAL FIXATION LEG Right 01/07/2017   Procedure: EXTERNAL FIXATION LEG;  Surgeon: Samson Frederic, MD;  Location: [facility] OR;  Service: Orthopedics;  Laterality: Right;  . FRACTURE SURGERY    . JOINT REPLACEMENT    . LAPAROSCOPIC CHOLECYSTECTOMY    . OPEN REDUCTION INTERNAL FIXATION (ORIF) TIBIA/FIBULA FRACTURE Right 01/20/2017   Procedure: OPEN REDUCTION INTERNAL FIXATION RIGHT ANKLE;  Surgeon: Samson Frederic, MD;  Location: [facility] OR;  Service: Orthopedics;  Laterality: Right;  . SALPINGOOPHORECTOMY  1978  . TONSILLECTOMY    . TOTAL KNEE ARTHROPLASTY Bilateral   . TUBAL LIGATION  1978  . VAGINAL HYSTERECTOMY  1979   "partial"    Family History  Problem Relation Age of Onset  . Alzheimer's disease Mother   . Congestive Heart Failure Father   . Diabetes Sister   . Hypertension Brother   . Diabetes Brother   . Diabetes Brother     Social history:  reports that she quit smoking about 33 years ago. Her smoking use included Cigarettes. She has a 5.50 pack-year smoking history.  She has never used smokeless tobacco. She reports that she does not drink alcohol or use drugs.    Allergies  Allergen Reactions  . Adhesive [Tape] Hives and Rash  . Codeine Nausea Only    Medications:  Prior to Admission medications   Medication Sig Start Date End Date Taking? Authorizing Provider  aspirin 81 MG tablet Take 81 mg by mouth daily.   Yes Historical Provider, MD  BIOTIN PO Take 300 mcg by mouth daily.    Yes Historical Provider, MD  CALCIUM PO Take 1,000 mg by mouth daily.    Yes Historical Provider, MD  Cholecalciferol (VITAMIN D-3) 1000 UNITS CAPS Take 1,000 Units by mouth 3 (three) times daily.   Yes Historical Provider, MD  Cinnamon 500 MG TABS Take 1,000  mg by mouth 2 (two) times daily.    Yes Historical Provider, MD  dicyclomine (BENTYL) 20 MG tablet Take 20 mg by mouth 3 (three) times daily.    Yes Historical Provider, MD  docusate sodium (COLACE) 100 MG capsule Take 1 capsule (100 mg total) by mouth 2 (two) times daily. 01/08/17  Yes Samson Frederic, MD  DULoxetine (CYMBALTA) 60 MG capsule Take 1 capsule (60 mg total) by mouth 2 (two) times daily. 02/19/17  Yes York Spaniel, MD  furosemide (LASIX) 40 MG tablet Take 80mg s every morning and 40mg s at 4pm   Yes Historical Provider, MD  gabapentin (NEURONTIN) 400 MG capsule Take 2 caps in the morning, supper time and  Night . Take 3 caps at noon. 02/19/17  Yes York Spaniel, MD  HYDROcodone-acetaminophen (NORCO/VICODIN) 5-325 MG tablet Take 1-2 tablets by mouth every 4 (four) hours as needed (breakthrough pain). 01/08/17  Yes Samson Frederic, MD  insulin glargine (LANTUS) 100 UNIT/ML injection Inject into the skin. 40 units in the am and 10 in the pm   Yes Historical Provider, MD  Insulin Lispro, Human, (HUMALOG [place]) Inject into the skin. 70-150 inject 3 units, 151-200 = 5 units, 201-250 = 7 units, 151-300 = 9 units, 301-350 = 11 units, 351-400 = 13 units, >401 = 16 units and notify Dr   Yes Historical Provider, MD  Methylcellulose, Laxative, (FIBER THERAPY) 500 MG TABS Take 1,000 mg by mouth daily.   Yes Historical Provider, MD  metoprolol succinate (TOPROL XL) 25 MG 24 hr tablet Take 25 mg by mouth 2 (two) times daily.   Yes Historical Provider, MD  Omega-3 Fatty Acids (FISH OIL) 1200 MG CAPS Take 1,200 mg by mouth 3 (three) times daily.   Yes Historical Provider, MD  omeprazole (PRILOSEC) 20 MG capsule Take 20 mg by mouth 2 (two) times daily.    Yes Historical Provider, MD  Psyllium (METAMUCIL PO) Take 2 capsules by mouth daily.   Yes Historical Provider, MD  ramipril (ALTACE) 10 MG capsule Take 10 mg by mouth daily.    Yes Historical Provider, MD  senna (SENOKOT) 8.6 MG TABS tablet Take 1 tablet  (8.6 mg total) by mouth 2 (two) times daily. 01/08/17  Yes Samson Frederic, MD  Turmeric 500 MG TABS Take 1,000 mg by mouth daily.   Yes Historical Provider, MD  vitamin E 400 UNIT capsule Take 400 Units by mouth daily.   Yes Historical Provider, MD  zolpidem (AMBIEN) 10 MG tablet Take 1 tablet (10 mg total) by mouth at bedtime as needed. for sleep 02/19/17  Yes York Spaniel, MD    ROS:  Out of a complete 14 system review of symptoms, the patient  complains only of the following symptoms, and all other reviewed systems are negative.  Eye itching, eye redness Joint pain, back pain, achy muscles, neck pain, neck stiffness Left leg weakness  Blood pressure 119/72, pulse (!) 57, height 5' 3.5" (1.613 m), SpO2 98 %.  Physical Exam  General: The patient is alert and cooperative at the time of the examination. The patient is markedly obese.  Neuromuscular: The patient has pain in the left hip and groin area with flexion, with internal and external rotation, there appears to be significant pain around the left hip.  Skin: No significant peripheral edema is noted. The right leg is in a brace.   Neurologic Exam  Mental status: The patient is alert and oriented x 3 at the time of the examination. The patient has apparent normal recent and remote memory, with an apparently normal attention span and concentration ability.   Cranial nerves: Facial symmetry is present. Speech is normal, no aphasia or dysarthria is noted. Extraocular movements are full. Visual fields are full.  Motor: The patient has good strength in all 4 extremities.  Sensory examination: Soft touch sensation is symmetric on the face, arms, and legs.  Coordination: The patient has good finger-nose-finger bilaterally.  Gait and station: The patient is unable to bear on the right leg, gait was not tested.  Reflexes: Deep tendon reflexes are symmetric, but are depressed.   Assessment/Plan:  1. Fibromyalgia  2.  Obesity  3. Chronic low back pain  The patient appears to have some pain in the left hip area with flexion, the patient may have intrinsic left hip disease, I would recommend x-ray of the left hip. The patient will be given a prescription for the gabapentin, Ambien, and Cymbalta. She will follow-up in 6 or 7 months. The patient is not able to ambulate currently.  Marlan Palau. Keith Willis MD 02/19/2017 11:23 AM  [facility] [address] [place] [id number]  Phone [phone] Fax [phone]

## 2017-02-19 NOTE — Progress Notes (Signed)
Faxed printed/signed rx zolpidem to pt pharmacy. Fax: [phone]. Received confirmation.

## 2017-03-12 ENCOUNTER — Encounter (HOSPITAL_COMMUNITY): Payer: Self-pay | Admitting: *Deleted

## 2017-03-12 ENCOUNTER — Ambulatory Visit: Payer: Self-pay | Admitting: Orthopedic Surgery

## 2017-03-13 ENCOUNTER — Ambulatory Visit (HOSPITAL_COMMUNITY): Payer: Medicare Other

## 2017-03-13 ENCOUNTER — Ambulatory Visit (HOSPITAL_COMMUNITY): Payer: Medicare Other | Admitting: Anesthesiology

## 2017-03-13 ENCOUNTER — Observation Stay (HOSPITAL_COMMUNITY)
Admission: RE | Admit: 2017-03-13 | Discharge: 2017-03-15 | Disposition: A | Discharge status: Home / Self Care | Payer: Medicare Other | Source: Ambulatory Visit | Attending: Orthopedic Surgery | Admitting: Orthopedic Surgery

## 2017-03-13 ENCOUNTER — Observation Stay (HOSPITAL_COMMUNITY): Payer: Medicare Other

## 2017-03-13 ENCOUNTER — Encounter (HOSPITAL_COMMUNITY)
Admission: RE | Disposition: A | Discharge status: Home / Self Care | Payer: Self-pay | Source: Ambulatory Visit | Attending: Orthopedic Surgery

## 2017-03-13 ENCOUNTER — Encounter (HOSPITAL_COMMUNITY): Payer: Self-pay

## 2017-03-13 DIAGNOSIS — Z6841 Body Mass Index (BMI) 40.0 and over, adult: Secondary | ICD-10-CM | POA: Diagnosis not present

## 2017-03-13 DIAGNOSIS — S93431S Sprain of tibiofibular ligament of right ankle, sequela: Secondary | ICD-10-CM

## 2017-03-13 DIAGNOSIS — I129 Hypertensive chronic kidney disease with stage 1 through stage 4 chronic kidney disease, or unspecified chronic kidney disease: Secondary | ICD-10-CM | POA: Insufficient documentation

## 2017-03-13 DIAGNOSIS — S93432D Sprain of tibiofibular ligament of left ankle, subsequent encounter: Secondary | ICD-10-CM | POA: Diagnosis not present

## 2017-03-13 DIAGNOSIS — R040 Epistaxis: Secondary | ICD-10-CM | POA: Insufficient documentation

## 2017-03-13 DIAGNOSIS — E7800 Pure hypercholesterolemia, unspecified: Secondary | ICD-10-CM | POA: Diagnosis not present

## 2017-03-13 DIAGNOSIS — I959 Hypotension, unspecified: Secondary | ICD-10-CM | POA: Insufficient documentation

## 2017-03-13 DIAGNOSIS — K219 Gastro-esophageal reflux disease without esophagitis: Secondary | ICD-10-CM | POA: Insufficient documentation

## 2017-03-13 DIAGNOSIS — N183 Chronic kidney disease, stage 3 (moderate): Secondary | ICD-10-CM | POA: Insufficient documentation

## 2017-03-13 DIAGNOSIS — M797 Fibromyalgia: Secondary | ICD-10-CM | POA: Insufficient documentation

## 2017-03-13 DIAGNOSIS — E1122 Type 2 diabetes mellitus with diabetic chronic kidney disease: Secondary | ICD-10-CM | POA: Insufficient documentation

## 2017-03-13 DIAGNOSIS — W19XXXD Unspecified fall, subsequent encounter: Secondary | ICD-10-CM | POA: Diagnosis not present

## 2017-03-13 DIAGNOSIS — Z96653 Presence of artificial knee joint, bilateral: Secondary | ICD-10-CM | POA: Diagnosis not present

## 2017-03-13 DIAGNOSIS — Z8673 Personal history of transient ischemic attack (TIA), and cerebral infarction without residual deficits: Secondary | ICD-10-CM | POA: Diagnosis not present

## 2017-03-13 DIAGNOSIS — T8489XD Other specified complication of internal orthopedic prosthetic devices, implants and grafts, subsequent encounter: Secondary | ICD-10-CM | POA: Insufficient documentation

## 2017-03-13 DIAGNOSIS — Z7982 Long term (current) use of aspirin: Secondary | ICD-10-CM | POA: Diagnosis not present

## 2017-03-13 DIAGNOSIS — Z419 Encounter for procedure for purposes other than remedying health state, unspecified: Secondary | ICD-10-CM

## 2017-03-13 DIAGNOSIS — Z79899 Other long term (current) drug therapy: Secondary | ICD-10-CM | POA: Diagnosis not present

## 2017-03-13 DIAGNOSIS — Z794 Long term (current) use of insulin: Secondary | ICD-10-CM | POA: Insufficient documentation

## 2017-03-13 DIAGNOSIS — R29898 Other symptoms and signs involving the musculoskeletal system: Secondary | ICD-10-CM

## 2017-03-13 DIAGNOSIS — Z8719 Personal history of other diseases of the digestive system: Secondary | ICD-10-CM | POA: Insufficient documentation

## 2017-03-13 DIAGNOSIS — Z87891 Personal history of nicotine dependence: Secondary | ICD-10-CM | POA: Insufficient documentation

## 2017-03-13 HISTORY — DX: Sprain of tibiofibular ligament of right ankle, sequela: S93.431S

## 2017-03-13 HISTORY — PX: SYNDESMOSIS REPAIR: SHX5182

## 2017-03-13 LAB — CBC
HEMATOCRIT: 38.6 % (ref 36.0–46.0)
HEMOGLOBIN: 12.6 g/dL (ref 12.0–15.0)
MCH: 29.6 pg (ref 26.0–34.0)
MCHC: 32.6 g/dL (ref 30.0–36.0)
MCV: 90.6 fL (ref 78.0–100.0)
Platelets: 172 10*3/uL (ref 150–400)
RBC: 4.26 MIL/uL (ref 3.87–5.11)
RDW: 13.2 % (ref 11.5–15.5)
WBC: 4.8 10*3/uL (ref 4.0–10.5)

## 2017-03-13 LAB — BASIC METABOLIC PANEL
ANION GAP: 8 (ref 5–15)
BUN: 22 mg/dL — ABNORMAL HIGH (ref 6–20)
CO2: 32 mmol/L (ref 22–32)
Calcium: 9.5 mg/dL (ref 8.9–10.3)
Chloride: 102 mmol/L (ref 101–111)
Creatinine, Ser: 1.47 mg/dL — ABNORMAL HIGH (ref 0.44–1.00)
GFR calc Af Amer: 41 mL/min — ABNORMAL LOW (ref >60)
GFR calc non Af Amer: 35 mL/min — ABNORMAL LOW (ref >60)
Glucose, Bld: 63 mg/dL — ABNORMAL LOW (ref 65–99)
POTASSIUM: 4.1 mmol/L (ref 3.5–5.1)
Sodium: 142 mmol/L (ref 135–145)

## 2017-03-13 LAB — GLUCOSE, CAPILLARY
GLUCOSE-CAPILLARY: 49 mg/dL — AB (ref 65–99)
Glucose-Capillary: 63 mg/dL — ABNORMAL LOW (ref 65–99)
Glucose-Capillary: 75 mg/dL (ref 65–99)
Glucose-Capillary: 82 mg/dL (ref 65–99)
Glucose-Capillary: 92 mg/dL (ref 65–99)

## 2017-03-13 SURGERY — REPAIR, SYNDESMOSIS, ANKLE
Anesthesia: General | Site: Ankle | Laterality: Right

## 2017-03-13 MED ORDER — FUROSEMIDE 40 MG PO TABS
80.0000 mg | ORAL_TABLET | Freq: Every day | ORAL | Status: DC
  Administered 2017-03-15: 10:00:00 80 mg via ORAL
  Filled 2017-03-13 (×2): qty 2

## 2017-03-13 MED ORDER — ACETAMINOPHEN 650 MG RE SUPP: 650.0000 mg | Freq: Four times a day (QID) | RECTAL | Status: DC | PRN

## 2017-03-13 MED ORDER — ONDANSETRON HCL 4 MG/2ML IJ SOLN
INTRAMUSCULAR | Status: AC
  Filled 2017-03-13: qty 2

## 2017-03-13 MED ORDER — GABAPENTIN 400 MG PO CAPS
800.0000 mg | ORAL_CAPSULE | Freq: Every day | ORAL | Status: DC
  Administered 2017-03-14 – 2017-03-15 (×2): 800 mg via ORAL
  Filled 2017-03-13 (×2): qty 2

## 2017-03-13 MED ORDER — MIDAZOLAM HCL 2 MG/2ML IJ SOLN
2.0000 mg | Freq: Once | INTRAMUSCULAR | Status: AC
  Administered 2017-03-13: 1 mg via INTRAVENOUS

## 2017-03-13 MED ORDER — DOCUSATE SODIUM 100 MG PO CAPS
100.0000 mg | ORAL_CAPSULE | Freq: Two times a day (BID) | ORAL | Status: DC
  Administered 2017-03-14 – 2017-03-15 (×3): 100 mg via ORAL
  Filled 2017-03-13 (×3): qty 1

## 2017-03-13 MED ORDER — FENTANYL CITRATE (PF) 100 MCG/2ML IJ SOLN
INTRAMUSCULAR | Status: AC
  Filled 2017-03-13: qty 2

## 2017-03-13 MED ORDER — DEXTROSE 50 % IV SOLN
INTRAVENOUS | Status: AC
  Filled 2017-03-13: qty 50

## 2017-03-13 MED ORDER — METHYLCELLULOSE (LAXATIVE) 500 MG PO TABS: 1000.0000 mg | ORAL_TABLET | Freq: Every day | ORAL | Status: DC

## 2017-03-13 MED ORDER — KETOROLAC TROMETHAMINE 15 MG/ML IJ SOLN
7.5000 mg | Freq: Four times a day (QID) | INTRAMUSCULAR | Status: DC
  Administered 2017-03-13 – 2017-03-14 (×2): 7.5 mg via INTRAVENOUS
  Filled 2017-03-13 (×2): qty 1

## 2017-03-13 MED ORDER — ARTIFICIAL TEARS OP OINT
TOPICAL_OINTMENT | OPHTHALMIC | Status: AC
  Filled 2017-03-13: qty 3.5

## 2017-03-13 MED ORDER — SODIUM CHLORIDE 0.9 % IV SOLN
INTRAVENOUS | Status: DC
  Administered 2017-03-13: 22:00:00 via INTRAVENOUS

## 2017-03-13 MED ORDER — METOPROLOL SUCCINATE ER 25 MG PO TB24
25.0000 mg | ORAL_TABLET | Freq: Two times a day (BID) | ORAL | Status: DC
  Administered 2017-03-14 – 2017-03-15 (×3): 25 mg via ORAL
  Filled 2017-03-13 (×3): qty 1

## 2017-03-13 MED ORDER — INSULIN GLARGINE 100 UNIT/ML ~~LOC~~ SOLN
40.0000 [IU] | Freq: Every day | SUBCUTANEOUS | Status: DC
  Administered 2017-03-15: 10:00:00 40 [IU] via SUBCUTANEOUS
  Filled 2017-03-13 (×2): qty 0.4

## 2017-03-13 MED ORDER — VITAMIN D 1000 UNITS PO TABS
1000.0000 [IU] | ORAL_TABLET | Freq: Three times a day (TID) | ORAL | Status: DC
  Administered 2017-03-14 – 2017-03-15 (×5): 1000 [IU] via ORAL
  Filled 2017-03-13 (×5): qty 1

## 2017-03-13 MED ORDER — MIDAZOLAM HCL 2 MG/2ML IJ SOLN
INTRAMUSCULAR | Status: AC
  Filled 2017-03-13: qty 2

## 2017-03-13 MED ORDER — ONDANSETRON HCL 4 MG/2ML IJ SOLN: 4.0000 mg | Freq: Four times a day (QID) | INTRAMUSCULAR | Status: DC | PRN

## 2017-03-13 MED ORDER — FENTANYL CITRATE (PF) 100 MCG/2ML IJ SOLN
100.0000 ug | Freq: Once | INTRAMUSCULAR | Status: AC
  Administered 2017-03-13: 50 ug via INTRAVENOUS

## 2017-03-13 MED ORDER — ZOLPIDEM TARTRATE 10 MG PO TABS
5.0000 mg | ORAL_TABLET | Freq: Every evening | ORAL | Status: DC | PRN
  Administered 2017-03-14: 23:00:00 5 mg via ORAL
  Filled 2017-03-13: qty 1

## 2017-03-13 MED ORDER — HYDROMORPHONE HCL 1 MG/ML IJ SOLN: 0.2500 mg | INTRAMUSCULAR | Status: DC | PRN

## 2017-03-13 MED ORDER — PROPOFOL 10 MG/ML IV BOLUS
INTRAVENOUS | Status: AC
  Filled 2017-03-13: qty 20

## 2017-03-13 MED ORDER — ONDANSETRON HCL 4 MG/2ML IJ SOLN
INTRAMUSCULAR | Status: DC | PRN
  Administered 2017-03-13: 4 mg via INTRAVENOUS

## 2017-03-13 MED ORDER — ISOPROPYL ALCOHOL 70 % SOLN
Status: DC | PRN
  Administered 2017-03-13: 1 via TOPICAL

## 2017-03-13 MED ORDER — ACETAMINOPHEN 325 MG PO TABS: 650.0000 mg | ORAL_TABLET | Freq: Four times a day (QID) | ORAL | Status: DC | PRN

## 2017-03-13 MED ORDER — GABAPENTIN 600 MG PO TABS
1200.0000 mg | ORAL_TABLET | Freq: Every day | ORAL | Status: DC
  Filled 2017-03-13: qty 2

## 2017-03-13 MED ORDER — HYDROCODONE-ACETAMINOPHEN 5-325 MG PO TABS
1.0000 | ORAL_TABLET | ORAL | Status: DC | PRN
  Administered 2017-03-14: 1 via ORAL
  Administered 2017-03-14 (×3): 2 via ORAL
  Administered 2017-03-15 (×2): 1 via ORAL
  Filled 2017-03-13: qty 2
  Filled 2017-03-13: qty 1
  Filled 2017-03-13: qty 2
  Filled 2017-03-13 (×2): qty 1
  Filled 2017-03-13: qty 2

## 2017-03-13 MED ORDER — BIOTIN 3 MG PO TABS: 0.3000 mg | ORAL_TABLET | Freq: Every day | ORAL | Status: DC

## 2017-03-13 MED ORDER — SENNA 8.6 MG PO TABS
1.0000 | ORAL_TABLET | Freq: Two times a day (BID) | ORAL | Status: DC
  Administered 2017-03-14 – 2017-03-15 (×3): 8.6 mg via ORAL
  Filled 2017-03-13 (×3): qty 1

## 2017-03-13 MED ORDER — OXYCODONE HCL 5 MG PO TABS: 5.0000 mg | ORAL_TABLET | Freq: Once | ORAL | Status: DC | PRN

## 2017-03-13 MED ORDER — INSULIN GLARGINE 100 UNIT/ML ~~LOC~~ SOLN
10.0000 [IU] | Freq: Every day | SUBCUTANEOUS | Status: DC
  Filled 2017-03-13 (×3): qty 0.1

## 2017-03-13 MED ORDER — LIDOCAINE 2% (20 MG/ML) 5 ML SYRINGE
INTRAMUSCULAR | Status: AC
  Filled 2017-03-13: qty 5

## 2017-03-13 MED ORDER — BUPIVACAINE-EPINEPHRINE (PF) 0.5% -1:200000 IJ SOLN
INTRAMUSCULAR | Status: DC | PRN
  Administered 2017-03-13: 30 mL via PERINEURAL

## 2017-03-13 MED ORDER — CHLORHEXIDINE GLUCONATE 4 % EX LIQD: 60.0000 mL | Freq: Once | CUTANEOUS | Status: DC

## 2017-03-13 MED ORDER — 0.9 % SODIUM CHLORIDE (POUR BTL) OPTIME
TOPICAL | Status: DC | PRN
  Administered 2017-03-13: 1000 mL

## 2017-03-13 MED ORDER — ISOPROPYL ALCOHOL 70 % SOLN
Status: AC
  Filled 2017-03-13: qty 480

## 2017-03-13 MED ORDER — PANTOPRAZOLE SODIUM 40 MG PO TBEC
40.0000 mg | DELAYED_RELEASE_TABLET | Freq: Every day | ORAL | Status: DC
  Administered 2017-03-14 – 2017-03-15 (×2): 40 mg via ORAL
  Filled 2017-03-13 (×2): qty 1

## 2017-03-13 MED ORDER — FENTANYL CITRATE (PF) 100 MCG/2ML IJ SOLN
INTRAMUSCULAR | Status: DC | PRN
  Administered 2017-03-13 (×2): 50 ug via INTRAVENOUS

## 2017-03-13 MED ORDER — DICYCLOMINE HCL 20 MG PO TABS
20.0000 mg | ORAL_TABLET | Freq: Three times a day (TID) | ORAL | Status: DC
  Administered 2017-03-14 – 2017-03-15 (×5): 20 mg via ORAL
  Filled 2017-03-13 (×5): qty 1

## 2017-03-13 MED ORDER — POLYETHYLENE GLYCOL 3350 17 G PO PACK: 17.0000 g | PACK | Freq: Every day | ORAL | Status: DC | PRN

## 2017-03-13 MED ORDER — METHOCARBAMOL 500 MG PO TABS
500.0000 mg | ORAL_TABLET | Freq: Four times a day (QID) | ORAL | Status: DC | PRN
Start: 2017-03-13 — End: 2017-03-15
  Administered 2017-03-15: 12:00:00 500 mg via ORAL
  Filled 2017-03-13 (×2): qty 1

## 2017-03-13 MED ORDER — ONDANSETRON HCL 4 MG PO TABS: 4.0000 mg | ORAL_TABLET | Freq: Four times a day (QID) | ORAL | Status: DC | PRN

## 2017-03-13 MED ORDER — ENOXAPARIN SODIUM 40 MG/0.4ML ~~LOC~~ SOLN
40.0000 mg | SUBCUTANEOUS | Status: DC
  Administered 2017-03-14: 40 mg via SUBCUTANEOUS
  Filled 2017-03-13: qty 0.4

## 2017-03-13 MED ORDER — PROPOFOL 10 MG/ML IV BOLUS
INTRAVENOUS | Status: DC | PRN
  Administered 2017-03-13: 200 mg via INTRAVENOUS

## 2017-03-13 MED ORDER — METOCLOPRAMIDE HCL 5 MG/ML IJ SOLN: 5.0000 mg | Freq: Three times a day (TID) | INTRAMUSCULAR | Status: DC | PRN

## 2017-03-13 MED ORDER — CEFAZOLIN SODIUM-DEXTROSE 2-4 GM/100ML-% IV SOLN
2.0000 g | INTRAVENOUS | Status: AC
  Administered 2017-03-13: 2 g via INTRAVENOUS
  Filled 2017-03-13: qty 100

## 2017-03-13 MED ORDER — OXYCODONE HCL 5 MG/5ML PO SOLN
5.0000 mg | Freq: Once | ORAL | Status: DC | PRN
  Filled 2017-03-13: qty 5

## 2017-03-13 MED ORDER — FUROSEMIDE 40 MG PO TABS
40.0000 mg | ORAL_TABLET | Freq: Every day | ORAL | Status: DC
  Administered 2017-03-14 – 2017-03-15 (×2): 40 mg via ORAL
  Filled 2017-03-13 (×2): qty 1

## 2017-03-13 MED ORDER — INSULIN ASPART 100 UNIT/ML ~~LOC~~ SOLN
0.0000 [IU] | Freq: Three times a day (TID) | SUBCUTANEOUS | Status: DC
  Administered 2017-03-15: 4 [IU] via SUBCUTANEOUS

## 2017-03-13 MED ORDER — RAMIPRIL 10 MG PO CAPS
10.0000 mg | ORAL_CAPSULE | Freq: Every day | ORAL | Status: DC
  Administered 2017-03-15: 10:00:00 10 mg via ORAL
  Filled 2017-03-13 (×2): qty 1

## 2017-03-13 MED ORDER — MIDAZOLAM HCL 5 MG/5ML IJ SOLN
INTRAMUSCULAR | Status: DC | PRN
  Administered 2017-03-13: 2 mg via INTRAVENOUS

## 2017-03-13 MED ORDER — CALCIUM POLYCARBOPHIL 625 MG PO TABS
625.0000 mg | ORAL_TABLET | Freq: Every day | ORAL | Status: DC
  Administered 2017-03-14 – 2017-03-15 (×2): 625 mg via ORAL
  Filled 2017-03-13 (×2): qty 1

## 2017-03-13 MED ORDER — CEFAZOLIN SODIUM-DEXTROSE 2-4 GM/100ML-% IV SOLN
2.0000 g | Freq: Four times a day (QID) | INTRAVENOUS | Status: AC
  Administered 2017-03-13 – 2017-03-14 (×3): 2 g via INTRAVENOUS
  Filled 2017-03-13 (×3): qty 100

## 2017-03-13 MED ORDER — LACTATED RINGERS IV SOLN
INTRAVENOUS | Status: DC
  Administered 2017-03-13 (×2): via INTRAVENOUS

## 2017-03-13 MED ORDER — METOCLOPRAMIDE HCL 5 MG PO TABS: 5.0000 mg | ORAL_TABLET | Freq: Three times a day (TID) | ORAL | Status: DC | PRN

## 2017-03-13 MED ORDER — PSYLLIUM 95 % PO PACK
1.0000 | PACK | Freq: Every day | ORAL | Status: DC
  Administered 2017-03-14: 1 via ORAL
  Filled 2017-03-13 (×2): qty 1

## 2017-03-13 MED ORDER — DEXTROSE 5 % IV SOLN
500.0000 mg | Freq: Four times a day (QID) | INTRAVENOUS | Status: DC | PRN
  Administered 2017-03-13: 500 mg via INTRAVENOUS
  Filled 2017-03-13: qty 550
  Filled 2017-03-13: qty 5

## 2017-03-13 MED ORDER — HYDROMORPHONE HCL 1 MG/ML IJ SOLN: 0.5000 mg | INTRAMUSCULAR | Status: DC | PRN

## 2017-03-13 MED ORDER — DEXTROSE 50 % IV SOLN
25.0000 mL | Freq: Once | INTRAVENOUS | Status: AC
  Administered 2017-03-13: 25 mL via INTRAVENOUS

## 2017-03-13 MED ORDER — LIDOCAINE 2% (20 MG/ML) 5 ML SYRINGE
INTRAMUSCULAR | Status: DC | PRN
  Administered 2017-03-13: 100 mg via INTRAVENOUS

## 2017-03-13 MED ORDER — DULOXETINE HCL 60 MG PO CPEP
60.0000 mg | ORAL_CAPSULE | Freq: Two times a day (BID) | ORAL | Status: DC
  Administered 2017-03-13 – 2017-03-15 (×4): 60 mg via ORAL
  Filled 2017-03-13 (×4): qty 1

## 2017-03-13 SURGICAL SUPPLY — 56 items
BAG SPEC THK2 15X12 ZIP CLS (MISCELLANEOUS) ×1
BAG ZIPLOCK 12X15 (MISCELLANEOUS) ×3 IMPLANT
BANDAGE ACE 4X5 VEL STRL LF (GAUZE/BANDAGES/DRESSINGS) ×2 IMPLANT
BANDAGE ACE 6X5 VEL STRL LF (GAUZE/BANDAGES/DRESSINGS) ×3 IMPLANT
BANDAGE ESMARK 6X9 LF (GAUZE/BANDAGES/DRESSINGS) ×1 IMPLANT
BIT DRILL 2.5X2.75 QC CALB (BIT) ×2 IMPLANT
BNDG CMPR 9X6 STRL LF SNTH (GAUZE/BANDAGES/DRESSINGS) ×1
BNDG ESMARK 6X9 LF (GAUZE/BANDAGES/DRESSINGS) ×3
CLOSURE WOUND 1/2 X4 (GAUZE/BANDAGES/DRESSINGS) ×2
COVER MAYO STAND STRL (DRAPES) IMPLANT
CUFF TOURN SGL QUICK 34 (TOURNIQUET CUFF) ×3
CUFF TRNQT CYL 34X4X40X1 (TOURNIQUET CUFF) ×1 IMPLANT
DRAPE C-ARM 42X120 X-RAY (DRAPES) IMPLANT
DRAPE EXTREMITY T 121X128X90 (DRAPE) IMPLANT
DRAPE OEC MINIVIEW 54X84 (DRAPES) IMPLANT
DRAPE POUCH INSTRU U-SHP 10X18 (DRAPES) ×3 IMPLANT
DRAPE STERI IOBAN 125X83 (DRAPES) ×3 IMPLANT
DRAPE U-SHAPE 47X51 STRL (DRAPES) ×3 IMPLANT
DRSG EMULSION OIL 3X16 NADH (GAUZE/BANDAGES/DRESSINGS) ×3 IMPLANT
DRSG PAD ABDOMINAL 8X10 ST (GAUZE/BANDAGES/DRESSINGS) ×3 IMPLANT
DURAPREP 26ML APPLICATOR (WOUND CARE) ×3 IMPLANT
ELECT REM PT RETURN 15FT ADLT (MISCELLANEOUS) ×3 IMPLANT
GAUZE SPONGE 4X4 12PLY STRL (GAUZE/BANDAGES/DRESSINGS) ×3 IMPLANT
GAUZE XEROFORM 1X8 LF (GAUZE/BANDAGES/DRESSINGS) ×2 IMPLANT
GLOVE BIO SURGEON STRL SZ8.5 (GLOVE) ×4 IMPLANT
GLOVE BIOGEL PI IND STRL 7.5 (GLOVE) ×1 IMPLANT
GLOVE BIOGEL PI IND STRL 8.5 (GLOVE) ×1 IMPLANT
GLOVE BIOGEL PI INDICATOR 7.5 (GLOVE) ×2
GLOVE BIOGEL PI INDICATOR 8.5 (GLOVE) ×2
GOWN SPEC L3 XXLG W/TWL (GOWN DISPOSABLE) ×6 IMPLANT
GOWN STRL REUS W/ TWL XL LVL3 (GOWN DISPOSABLE) IMPLANT
GOWN STRL REUS W/TWL LRG LVL3 (GOWN DISPOSABLE) ×3 IMPLANT
GOWN STRL REUS W/TWL XL LVL3 (GOWN DISPOSABLE) ×3
KIT BASIN OR (CUSTOM PROCEDURE TRAY) ×3 IMPLANT
MANIFOLD NEPTUNE II (INSTRUMENTS) ×3 IMPLANT
PACK TOTAL JOINT (CUSTOM PROCEDURE TRAY) ×3 IMPLANT
PAD ABD 8X10 STRL (GAUZE/BANDAGES/DRESSINGS) ×2 IMPLANT
PAD CAST 4YDX4 CTTN HI CHSV (CAST SUPPLIES) IMPLANT
PADDING CAST COTTON 4X4 STRL (CAST SUPPLIES) ×3
PADDING CAST COTTON 6X4 STRL (CAST SUPPLIES) ×3 IMPLANT
POSITIONER SURGICAL ARM (MISCELLANEOUS) ×3 IMPLANT
SCREW CORT T15 TPR 55X3.5XST (Screw) IMPLANT
SCREW CORTICAL 3.5X55MM (Screw) ×3 IMPLANT
SCREW LOW PROFILE 3.5X48MM (Screw) ×2 IMPLANT
SCREW LP 3.5 (Screw) ×2 IMPLANT
SCREW LP 3.5X44 (Screw) ×2 IMPLANT
STAPLER VISISTAT 35W (STAPLE) IMPLANT
STRIP CLOSURE SKIN 1/2X4 (GAUZE/BANDAGES/DRESSINGS) ×4 IMPLANT
SUT ETHILON 3 0 PS 1 (SUTURE) ×4 IMPLANT
SUT MNCRL AB 4-0 PS2 18 (SUTURE) IMPLANT
SUT VIC AB 1 CT1 36 (SUTURE) ×9 IMPLANT
SUT VIC AB 2-0 CT1 27 (SUTURE) ×3
SUT VIC AB 2-0 CT1 TAPERPNT 27 (SUTURE) ×1 IMPLANT
SUT VIC AB 3-0 PS2 18 (SUTURE) ×3
SUT VIC AB 3-0 PS2 18XBRD (SUTURE) IMPLANT
TOWEL OR 17X26 10 PK STRL BLUE (TOWEL DISPOSABLE) ×6 IMPLANT

## 2017-03-13 NOTE — Discharge Instructions (Signed)
Non weight bearing right leg Ice, elevation Keep splint clean and dry. Do not remove Bring your boot to your post op appointment

## 2017-03-13 NOTE — Brief Op Note (Signed)
03/13/2017  6:37 PM  PATIENT:  Kem KaysSanta M Swift  71 y.o. female  PRE-OPERATIVE DIAGNOSIS:  Failed right ankle hardware  POST-OPERATIVE DIAGNOSIS:  Failed right ankle hardware  PROCEDURE:  Procedure(s) with comments: HARDWARE REMOVAL OF RIGHT ABKLE, OPEN REDUCTION INTERNAL FIXATION OF SYNDESMOTIC INJURY (Right) - Popliteal block  SURGEON:  Surgeon(s) and Role:    * Samson FredericBrian Jahsir Rama, MD - Primary  PHYSICIAN ASSISTANT:    ASSISTANTS: none   ANESTHESIA:   regional and general  EBL:  Total I/O In: 1000 [I.V.:1000] Out: 25 [Blood:25]  BLOOD ADMINISTERED:none  DRAINS: none   LOCAL MEDICATIONS USED:  NONE  SPECIMEN:  No Specimen  DISPOSITION OF SPECIMEN:  N/A  COUNTS:  YES  TOURNIQUET:   Total Tourniquet Time Documented: Thigh (Right) - 52 minutes Total: Thigh (Right) - 52 minutes   DICTATION: .Other Dictation: Dictation Number [id number]  PLAN OF CARE: Admit to inpatient   PATIENT DISPOSITION:  PACU - hemodynamically stable.   Delay start of Pharmacological VTE agent (>24hrs) due to surgical blood loss or risk of bleeding: no

## 2017-03-13 NOTE — H&P (Signed)
ORTHOPAEDIC CONSULTATION  REQUESTING PHYSICIAN: Samson FredericBrian Kyrese Gartman, MD  PCP:  Marylynn PearsonBurkhart,Jakhari Space, MD  Chief Complaint: Right syndesmosis injury with hardware failure  HPI: Chloe [name] is a 71 y.o. female who was treated by myself for right ankle fracture dislocation. She had surgery about 7 weeks ago to include ORIF of the lateral malleolus and open reconstruction of the syndesmosis. She was seen in the office for routine follow-up yesterday, and x-rays revealed failure of her syndesmotic implant. Her lateral malleolus fracture has healed.  Past Medical History:  Diagnosis Date  . Cervical spondylosis without myelopathy 08/10/2015  . Chronic kidney disease (CKD), stage III (moderate)   . Chronic low back pain 02/19/2017  . Chronic lower back pain   . Dissection of cerebral artery (HCC) 1997   "no OR"  . Endometriosis   . Family history of adverse reaction to anesthesia    "daughter has PONV"  . Fibromyalgia   . GERD (gastroesophageal reflux disease)   . High cholesterol    hx  . History of hiatal hernia   . Hypertension   . Obesity   . Occipital headache    "qd" (01/10/2017)  . Orthostatic hypotension 02/02/2015  . Plantar fasciitis   . Seasonal asthma   . Shingles   . Stomach ulcer    "I take RX for it" (01/10/2017)  . Stroke [facility](HCC) 1997; 08/2016  . Type II diabetes mellitus (HCC)   . Vertebral artery dissection [facility](HCC)    right    Past Surgical History:  Procedure Laterality Date  . ANKLE CLOSED REDUCTION Right 01/07/2017   Procedure: CLOSED REDUCTION ANKLE;  Surgeon: Samson FredericBrian Cecilee Rosner, MD;  Location: [facility] OR;  Service: Orthopedics;  Laterality: Right;  . APPENDECTOMY    . CATARACT EXTRACTION W/ INTRAOCULAR LENS  IMPLANT, BILATERAL  03/2014 - 04/2014   left - right   . CLOSED REDUCTION NASAL FRACTURE  1990"s  . EXTERNAL FIXATION LEG Right 01/07/2017   Procedure: EXTERNAL FIXATION LEG;  Surgeon: Samson FredericBrian Sylis Ketchum, MD;  Location: [facility] OR;  Service: Orthopedics;  Laterality: Right;  .  FRACTURE SURGERY    . JOINT REPLACEMENT    . LAPAROSCOPIC CHOLECYSTECTOMY    . OPEN REDUCTION INTERNAL FIXATION (ORIF) TIBIA/FIBULA FRACTURE Right 01/20/2017   Procedure: OPEN REDUCTION INTERNAL FIXATION RIGHT ANKLE;  Surgeon: Samson FredericBrian Baillie Mohammad, MD;  Location: [facility] OR;  Service: Orthopedics;  Laterality: Right;  . SALPINGOOPHORECTOMY  1978  . TONSILLECTOMY    . TOTAL KNEE ARTHROPLASTY Bilateral   . TUBAL LIGATION  1978  . VAGINAL HYSTERECTOMY  1979   "partial"   Social History   Social History  . Marital status: Widowed    Spouse name: Trey PaulaJeff  . Number of children: 4  . Years of education: 13+   Occupational History  . Retired     Social History Main Topics  . Smoking status: Former Smoker    Packs/day: 0.50    Years: 11.00    Types: Cigarettes    Quit date: 331985  . Smokeless tobacco: Never Used  . Alcohol use No  . Drug use: No  . Sexual activity: No   Other Topics Concern  . None   Social History Narrative   Patient lives at home with her husband.    Patient is a retired Architectural technologistteacher's assistant.    Patient has a high school education and some college.    Patient has four children.   Patient is right handed.   Patient drinks about 2 cups caffeine daily.  Family History  Problem Relation Age of Onset  . Alzheimer's disease Mother   . Congestive Heart Failure Father   . Diabetes Sister   . Hypertension Brother   . Diabetes Brother   . Diabetes Brother    Allergies  Allergen Reactions  . Adhesive [Tape] Hives and Rash  . Codeine Nausea Only   Prior to Admission medications   Medication Sig Start Date End Date Taking? Authorizing Provider  aspirin 81 MG tablet Take 81 mg by mouth daily.   Yes Historical Provider, MD  Biotin 3 MG TABS Take 300 mcg by mouth daily.    Yes Historical Provider, MD  CALCIUM PO Take 1,000 mg by mouth daily.    Yes Historical Provider, MD  Cholecalciferol (VITAMIN D-3) 1000 UNITS CAPS Take 1,000 Units by mouth 3 (three) times daily.   Yes  Historical Provider, MD  Cinnamon 500 MG TABS Take 1,000 mg by mouth 2 (two) times daily.    Yes Historical Provider, MD  dicyclomine (BENTYL) 20 MG tablet Take 20 mg by mouth 3 (three) times daily.    Yes Historical Provider, MD  docusate sodium (COLACE) 100 MG capsule Take 1 capsule (100 mg total) by mouth 2 (two) times daily. 01/08/17  Yes Samson Frederic, MD  DULoxetine (CYMBALTA) 60 MG capsule Take 1 capsule (60 mg total) by mouth 2 (two) times daily. 02/19/17  Yes York Spaniel, MD  furosemide (LASIX) 40 MG tablet Take 80mg s every morning and 40mg s at 4pm   Yes Historical Provider, MD  gabapentin (NEURONTIN) 400 MG capsule Take 2 caps in the morning, supper time and  Night . Take 3 caps at noon. 02/19/17  Yes York Spaniel, MD  HYDROcodone-acetaminophen (NORCO/VICODIN) 5-325 MG tablet Take 1-2 tablets by mouth every 4 (four) hours as needed (breakthrough pain). 01/08/17  Yes Samson Frederic, MD  insulin glargine (LANTUS) 100 UNIT/ML injection Inject into the skin. 40 units in the am and 10 in the pm   Yes Historical Provider, MD  Insulin Lispro, Human, (HUMALOG [place]) Inject into the skin. 70-150 inject 3 units, 151-200 = 5 units, 201-250 = 7 units, 151-300 = 9 units, 301-350 = 11 units, 351-400 = 13 units, >401 = 16 units and notify Dr Patient does humalog with meals only   Yes Historical Provider, MD  Methylcellulose, Laxative, (FIBER THERAPY) 500 MG TABS Take 1,000 mg by mouth daily.   Yes Historical Provider, MD  metoprolol succinate (TOPROL XL) 25 MG 24 hr tablet Take 25 mg by mouth 2 (two) times daily.   Yes Historical Provider, MD  Omega-3 Fatty Acids (FISH OIL) 1200 MG CAPS Take 1,200 mg by mouth 3 (three) times daily.   Yes Historical Provider, MD  omeprazole (PRILOSEC) 20 MG capsule Take 20 mg by mouth 2 (two) times daily.    Yes Historical Provider, MD  Psyllium (METAMUCIL PO) Take 2 capsules by mouth daily.   Yes Historical Provider, MD  ramipril (ALTACE) 10 MG capsule Take 10 mg by  mouth daily.    Yes Historical Provider, MD  senna (SENOKOT) 8.6 MG TABS tablet Take 1 tablet (8.6 mg total) by mouth 2 (two) times daily. 01/08/17  Yes Samson Frederic, MD  Turmeric 500 MG TABS Take 1,000 mg by mouth daily.   Yes Historical Provider, MD  vitamin E 400 UNIT capsule Take 400 Units by mouth daily.   Yes Historical Provider, MD  zolpidem (AMBIEN) 10 MG tablet Take 1 tablet (10 mg total) by mouth at  bedtime as needed. for sleep 02/19/17  Yes York Spaniel, MD   No results found.  Positive ROS: All other systems have been reviewed and were otherwise negative with the exception of those mentioned in the HPI and as above.  Physical Exam: General: Alert, no acute distress Cardiovascular: No pedal edema Respiratory: No cyanosis, no use of accessory musculature GI: No organomegaly, abdomen is soft and non-tender Skin: No lesions in the area of chief complaint Neurologic: Sensation intact distally Psychiatric: Patient is competent for consent with normal mood and affect Lymphatic: No axillary or cervical lymphadenopathy  MUSCULOSKELETAL: Examination of the right ankle reveals healed incisions. She has subjective sensory change to the superficial peroneal distribution. She has intact sensation to the posterior tibial and deep peroneal distributions. She has a palpable pedal pulse.  Assessment: Right syndesmosis injury with implant failure  Plan: I discussed the findings with the patient and her family. We will plan for partial hardware removal of the right ankle with repeat syndesmotic fixation. We discussed the use of 3.5 mm cortical screws. They understand that she will be nonweightbearing for about 8 weeks postoperatively. Eventually the screws will break. They understand and wish to proceed.    Chloe [name], Cloyde Reams, MD Cell [phone]    03/13/2017 3:35 PM

## 2017-03-13 NOTE — Progress Notes (Signed)
Hypoglycemic Event  CBG: 49  Treatment: D50 IV 25 mL  Symptoms: None  Possible Reasons for Event: Inadequate meal intake  Comments/MD notified:Hodierne    Frazier RichardsShepherd, Rae RoamKaren Guion

## 2017-03-13 NOTE — Anesthesia Preprocedure Evaluation (Signed)
Anesthesia Evaluation  Patient identified by MRN, date of birth, ID band Patient awake    Reviewed: Allergy & Precautions, H&P , NPO status , Patient's Chart, lab work & pertinent test results  Airway Mallampati: II   Neck ROM: full    Dental   Pulmonary asthma , former smoker,    breath sounds clear to auscultation       Cardiovascular hypertension,  Rhythm:regular Rate:Normal     Neuro/Psych  Headaches,  Neuromuscular disease CVA    GI/Hepatic hiatal hernia, PUD, GERD  ,  Endo/Other  diabetes, Type obesity  Renal/GU Renal InsufficiencyRenal disease     Musculoskeletal  (+) Arthritis , Fibromyalgia -  Abdominal   Peds  Hematology   Anesthesia Other Findings   Reproductive/Obstetrics                             Anesthesia Physical Anesthesia Plan  ASA: III  Anesthesia Plan: General   Post-op Pain Management:    Induction: Intravenous  Airway Management Planned: LMA  Additional Equipment:   Intra-op Plan:   Post-operative Plan:   Informed Consent: I have reviewed the patients History and Physical, chart, labs and discussed the procedure including the risks, benefits and alternatives for the proposed anesthesia with the patient or authorized representative who has indicated his/her understanding and acceptance.     Plan Discussed with: CRNA, Anesthesiologist and Surgeon  Anesthesia Plan Comments:         Anesthesia Quick Evaluation

## 2017-03-13 NOTE — Progress Notes (Signed)
Assisted Dr. Hodierne with right, ultrasound guided, popliteal block. Side rails up, monitors on throughout procedure. See vital signs in flow sheet. Tolerated Procedure well. 

## 2017-03-13 NOTE — Progress Notes (Signed)
PHARMACIST - PHYSICIAN ORDER COMMUNICATION  CONCERNING: P&T Medication Policy on Herbal Medications  DESCRIPTION:  This patient's order for:  Biotin  has been noted.  This product(s) is classified as an "herbal" or natural product. Due to a lack of definitive safety studies or FDA approval, nonstandard manufacturing practices, plus the potential risk of unknown drug-drug interactions while on inpatient medications, the Pharmacy and Therapeutics Committee does not permit the use of "herbal" or natural products of this type within [facility].   ACTION TAKEN: The pharmacy department is unable to verify this order at this time and your patient has been informed of this safety policy. Please reevaluate patient's clinical condition at discharge and address if the herbal or natural product(s) should be resumed at that time.  Dorna LeitzAnh Brysan Mcevoy, PharmD, BCPS 03/13/2017 8:47 PM

## 2017-03-13 NOTE — Anesthesia Procedure Notes (Signed)
Anesthesia Regional Block: Popliteal block   Pre-Anesthetic Checklist: ,, timeout performed, Correct Patient, Correct Site, Correct Laterality, Correct Procedure, Correct Position, site marked, Risks and benefits discussed,  Surgical consent,  Pre-op evaluation,  At surgeon's request and post-op pain management  Laterality: Right  Prep: chloraprep       Needles:  Injection technique: Single-shot  Needle Type: Echogenic Stimulator Needle          Additional Needles:   Procedures: ultrasound guided, nerve stimulator,,,,,,   Nerve Stimulator or Paresthesia:  Response: plantar flexion of foot, 0.45 mA,   Additional Responses:   Narrative:  Start time: 03/13/2017 4:01 PM End time: 03/13/2017 4:12 PM Injection made incrementally with aspirations every 5 mL.  Performed by: Personally  Anesthesiologist: Lynard Postlewait  Additional Notes: Functioning IV was confirmed and monitors were applied.  A 90mm 21ga Arrow echogenic stimulator needle was used. Sterile prep and drape,hand hygiene and sterile gloves were used.  Negative aspiration and negative test dose prior to incremental administration of local anesthetic. The patient tolerated the procedure well.  Ultrasound guidance: relevent anatomy identified, needle position confirmed, local anesthetic spread visualized around nerve(s), vascular puncture avoided.  Image printed for medical record.

## 2017-03-13 NOTE — Transfer of Care (Signed)
Immediate Anesthesia Transfer of Care Note  Patient: Chloe [name]  Procedure(s) Performed: Procedure(s) with comments: HARDWARE REMOVAL OF RIGHT ABKLE, OPEN REDUCTION INTERNAL FIXATION OF SYNDESMOTIC INJURY (Right) - Popliteal block  Patient Location: PACU  Anesthesia Type:General  Level of Consciousness: awake, alert , oriented and patient cooperative  Airway & Oxygen Therapy: Patient Spontanous Breathing and Patient connected to face mask oxygen  Post-op Assessment: Report given to RN, Post -op Vital signs reviewed and stable and Patient moving all extremities X 4  Post vital signs: stable  Last Vitals:  Vitals:   03/13/17 1615 03/13/17 1835  BP:  (P) 134/71  Pulse: (!) 59 (P) 73  Resp: 16 (P) 14    Last Pain: There were no vitals filed for this visit.    Patients Stated Pain Goal: (P) 4 (03/13/17 1835)  Complications: No apparent anesthesia complications

## 2017-03-13 NOTE — Anesthesia Procedure Notes (Signed)
Procedure Name: LMA Insertion Date/Time: 03/13/2017 4:52 PM Performed by: Anastasio ChampionEVANS, Hansel Devan E Pre-anesthesia Checklist: Patient identified, Emergency Drugs available, Suction available and Patient being monitored Patient Re-evaluated:Patient Re-evaluated prior to inductionOxygen Delivery Method: Circle system utilized Preoxygenation: Pre-oxygenation with 100% oxygen Intubation Type: IV induction Ventilation: Mask ventilation without difficulty LMA: LMA inserted LMA Size: 4.0 Tube type: Oral Number of attempts: 1 Airway Equipment and Method: Oral airway Placement Confirmation: positive ETCO2 Tube secured with: Tape Dental Injury: Teeth and Oropharynx as per pre-operative assessment

## 2017-03-14 ENCOUNTER — Observation Stay (HOSPITAL_COMMUNITY): Payer: Medicare Other

## 2017-03-14 ENCOUNTER — Encounter (HOSPITAL_COMMUNITY): Payer: Self-pay | Admitting: Orthopedic Surgery

## 2017-03-14 DIAGNOSIS — S93432D Sprain of tibiofibular ligament of left ankle, subsequent encounter: Secondary | ICD-10-CM | POA: Diagnosis not present

## 2017-03-14 LAB — GLUCOSE, CAPILLARY
GLUCOSE-CAPILLARY: 93 mg/dL (ref 65–99)
GLUCOSE-CAPILLARY: 107 mg/dL — AB (ref 65–99)
GLUCOSE-CAPILLARY: 117 mg/dL — AB (ref 65–99)
Glucose-Capillary: 112 mg/dL — ABNORMAL HIGH (ref 65–99)
Glucose-Capillary: 79 mg/dL (ref 65–99)
Glucose-Capillary: 146 mg/dL — ABNORMAL HIGH (ref 65–99)

## 2017-03-14 LAB — HEMOGLOBIN A1C
Hgb A1c MFr Bld: 5.5 % (ref 4.8–5.6)
Mean Plasma Glucose: 111 mg/dL

## 2017-03-14 MED ORDER — ENOXAPARIN SODIUM 30 MG/0.3ML ~~LOC~~ SOLN: 30.0000 mg | SUBCUTANEOUS | 0 refills | Status: DC

## 2017-03-14 MED ORDER — GABAPENTIN 400 MG PO CAPS
1200.0000 mg | ORAL_CAPSULE | Freq: Every day | ORAL | Status: DC
  Administered 2017-03-14 – 2017-03-15 (×2): 1200 mg via ORAL
  Filled 2017-03-14 (×2): qty 3

## 2017-03-14 MED ORDER — SODIUM CHLORIDE 0.9 % IV BOLUS (SEPSIS)
500.0000 mL | Freq: Once | INTRAVENOUS | Status: AC
  Administered 2017-03-14: 500 mL via INTRAVENOUS

## 2017-03-14 MED ORDER — ENOXAPARIN SODIUM 30 MG/0.3ML ~~LOC~~ SOLN
30.0000 mg | SUBCUTANEOUS | Status: DC
Start: 2017-03-15 — End: 2017-03-15
  Filled 2017-03-14: qty 0.3

## 2017-03-14 NOTE — Progress Notes (Signed)
RN received a call from MRI tech informing that pt had episode of epistaxis, spitting up large amount of bright, red blood, at least half of the green emesis bag, while during the procedure. Around 2000, pt arrived on the unit accompanied by daughter at bedside still with active bleeding. Cold compress immediately applied on the nasal area and vital signs taken BP-144/71 Pulse-71 Resp - 16 O2 sat 94% room air. Suction was set-up and used to prevent her from swallowing the bloody discharge. At 2030, [facility], PA was paged and notified of event and the measures that were already taken to stop the bleeding but unsuccessful. PA suggested to apply pressure on the soft cartilage of the nose for full 10 minutes and suggested cauterization, which unfortunately is not being done on the unit. At 2110, after PA's suggestion was implemented and nasal packing applied on both nares, bleeding finally stopped. She still feels of a little bit of trickling at the back of her throat but at least, not as profusely as before, as she stated. At 2230, pt was able to take her scheduled night medications by mouth, refused to take her 10 units Lantus ( HS CBG was 117) and requested Vicodin 1 tab and Ambien 5 mg altogether for pain 4/10 and aid in sleep. As of 2315, pt is resting quietly in bed in semi-fowler's position. Will continue to monitor pt and her progress.

## 2017-03-14 NOTE — Care Management Obs Status (Signed)
MEDICARE OBSERVATION STATUS NOTIFICATION   Patient Details  Name: Chloe [name] MRN: [id number] Date of Birth: 09/16/1946   Medicare Observation Status Notification Given:  Yes    MahabirOlegario Messier, Ezri Landers, RN 03/14/2017, 3:16 PM

## 2017-03-14 NOTE — Progress Notes (Signed)
   Subjective:  Patient reports pain as mild to moderate.  C/o back pain and leg weakness.  Objective:   VITALS:   Vitals:   03/13/17 2333 03/14/17 0153 03/14/17 0515 03/14/17 0805  BP: (!) 106/45 123/81 (!) 99/46 (!) 90/52  Pulse: 63 65 63 63  Resp: 18 18 16 16   Temp: 97.9 F (36.6 C) 98.1 F (36.7 C) 98.1 F (36.7 C) 98.3 F (36.8 C)  TempSrc:  Oral Oral Oral  SpO2: 99% 100% 98% 100%  Weight:      Height:       NAD ABD soft Sensation intact distally Intact pulses distally splint intact   Lab Results  Component Value Date   WBC 4.8 03/13/2017   HGB 12.6 03/13/2017   HCT 38.6 03/13/2017   MCV 90.6 03/13/2017   PLT 172 03/13/2017   BMET    Component Value Date/Time   NA 142 03/13/2017 1400   K 4.1 03/13/2017 1400   CL 102 03/13/2017 1400   CO2 32 03/13/2017 1400   GLUCOSE 63 (L) 03/13/2017 1400   BUN 22 (H) 03/13/2017 1400   CREATININE 1.47 (H) 03/13/2017 1400   CALCIUM 9.5 03/13/2017 1400   GFRNONAA 35 (L) 03/13/2017 1400   GFRAA 41 (L) 03/13/2017 1400     Assessment/Plan: 1 Day Post-Op   Principal Problem:   Ankle syndesmosis disruption, right, sequela Active Problems:   Syndesmotic disruption of ankle, right, sequela   NWB RLE Ice, elevation LE weakness: MRI L spine pending to rule out neural compression, may need MRI c spine if L spine is negative DVT ppx: lovenox, SCDs, TEDs Glucose control Hypotension: hold ARB, bolus 500 cc NS CKD II: mild elevation of creatinine, hold lasix, recheck in am Dispo: MRI L spine pending, anticipate d/c tomorrow   Garnet KoyanagiSwinteck, Moesha Sarchet James 03/14/2017, 9:39 AM   Samson FredericBrian Lakeyia Surber, MD Cell [phone]

## 2017-03-14 NOTE — Progress Notes (Signed)
OT Cancellation Note  Patient Details Name: Chloe [name] MRN: [id number] DOB: 10/06/1946   Cancelled Treatment:    Reason Eval/Treat Not Completed: Medical issues which prohibited therapy.  [name]. Will check back later or tomorrow.  Jerrica Thorman 03/14/2017, 12:26 PM  Marica OtterMaryellen Laural Eiland, OTR/L [phone] 03/14/2017

## 2017-03-14 NOTE — Op Note (Signed)
NAME:  Chloe [name], [name]                    ACCOUNT NO.:  MEDICAL RECORD NO.:  [id number]  LOCATION:                                 FACILITY:  PHYSICIAN:  Samson FredericBrian Seniah Lawrence, MD     DATE OF BIRTH:  [birth year]  DATE OF PROCEDURE:  03/13/2017 DATE OF DISCHARGE:                              OPERATIVE REPORT   SURGEON:  Samson FredericBrian Quantarius Genrich, MD.  ASSISTANT:  Staff.  PREOPERATIVE DIAGNOSIS:  Failed right ankle syndesmotic fixation.  POSTOPERATIVE DIAGNOSIS:  Failed right ankle syndesmotic fixation.  PROCEDURE PERFORMED: 1. Partial hardware removal, deep, right ankle. 2. Open treatment, right syndesmotic injury.  ANESTHESIA:  Popliteal block plus general.  ESTIMATED BLOOD LOSS:  Minimal.  ANTIBIOTICS:  2 g Ancef.  COMPLICATIONS:  None.  TUBES AND DRAINS:  None.  DISPOSITION:  Stable to PACU.  INDICATIONS:  The patient is a 71 year old female, who previously fell and had a right ankle fracture dislocation.  I had originally seen her in the office.  The ankle was persistently dislocated.  I took her for closed reduction and external fixator placement.  On January 07, 2017, I removed her fixator and performed an ORIF of the lateral malleolus with syndesmotic fixation with a ZipTight implant.  She presented to the office yesterday for routine radiographic followup, and she was found to have failure of her syndesmotic fixation.  We discussed the risks, benefits, and alternatives to hardware removal and open treatment of her syndesmosis.  She elected to proceed.  DESCRIPTION OF PROCEDURE:  I identified the patient in the holding area using 2 identifiers.  A popliteal block was placed by Anesthesia.  She was then taken to the operating room, transferred to the operating room table.  General anesthesia was induced.  A bump was placed under the hip.  A nonsterile tourniquet was applied around the right thigh, and then the right lower extremity was prepped and draped in a normal sterile  surgical fashion.  A time-out was called verifying side and site of surgery.  She did receive IV antibiotics within 60 minutes of beginning of the procedure.  I used Esmarch exsanguination.  I elevated the tourniquet to 325 mmHg.  I used approximately 3 inches of the distal aspect of her incision.  Blunt dissection was performed to protect the superficial peroneal nerve.  Periosteal elevator was used to lift the scar tissue off the plate.  I identified her ZipTight button.  The sutures were intact, however they were stretched.  I cut them with a #15 blade.  The button was removed.  I made a small stab incision medially with a #15 blade.  While holding the foot in maximum dorsiflexion, I reduced the syndesmosis with a Darrick PennaKing Tong clamp.  I then placed a total of 3 tetra-cortical syndesmotic screws.  Their position was checked on AP, mortise, and lateral views.  I repeated stress views, which showed that the syndesmosis was intact.  The clamp was removed.  The wounds were copiously irrigated with saline.  I closed the fascia with 2-0 Vicryl, 3-0 Vicryl for the deep dermal tissue, 3-0 Allgower-Donati suture for the skin.  The tourniquet was let down.  A sterile dressing was applied followed by a well-padded, well-molded posterior splint with U.  She was then awakened from anesthesia, taken to PACU in stable condition.  Sponge, needle, and instrument counts were correct at the end of the case x2.  There were no known complications.  We will admit the patient to the hospital.  She will be nonweightbearing on the right lower extremity.  Given her complaints of lower extremities giving way and severe back pain, I will order an MRI of her lumbar spine.  She will work with Physical Therapy.  We will continue Lovenox for DVT prophylaxis.  We will plan for discharge home.          ______________________________ Samson Frederic, MD     BS/MEDQ  D:  03/13/2017  T:  03/13/2017  Job:  [id number]

## 2017-03-14 NOTE — Care Management Note (Signed)
Case Management Note  Patient Details  Name: Chloe [name] MRN: [id number] Date of Birth: 03/04/1946  Subjective/Objective: 71 y/o f admitted w/ R ankle syndesmosis, POD#1. PT-no f/u. From home, has rw. For MRI back.                    Action/Plan:d/c plan home.   Expected Discharge Date:                Expected Discharge Plan:  Home/Self Care  In-House Referral:     Discharge planning Services  CM Consult  Post Acute Care Choice:  Durable Medical Equipment (Has rw) Choice offered to:     DME Arranged:    DME Agency:     HH Arranged:    HH Agency:     Status of Service:  In process, will continue to follow  If discussed at Long Length of Stay Meetings, dates discussed:    Additional Comments:  Lanier ClamMahabir, Karthikeya Funke, RN 03/14/2017, 3:16 PM

## 2017-03-14 NOTE — Evaluation (Signed)
Physical Therapy Evaluation Patient Details Name: Chloe [name] MRN: [id number] DOB: 10/14/1946 Today's Date: 03/14/2017   History of Present Illness  HARDWARE REMOVAL OF RIGHT ABKLE, OPEN REDUCTION INTERNAL FIXATION OF SYNDESMOTIC INJURY (Right) -W/U  for left LE weakness: MRI L spine pending to rule out neural compression, may need MRI c spine if L spine is negative  Clinical Impression  The patient is functioning at her baseline with transfers only. No further PT needs at this time. Has DME.    Follow Up Recommendations No PT follow up    Equipment Recommendations  None recommended by PT    Recommendations for Other Services       Precautions / Restrictions Precautions Precautions: Fall Restrictions Weight Bearing Restrictions: Yes Other Position/Activity Restrictions: NWB      Mobility  Bed Mobility Overal bed mobility: Independent                Transfers Overall transfer level: Modified independent               General transfer comment: able to stand and pivot bed to [facility] and back to bed.  Ambulation/Gait                Stairs            Wheelchair Mobility    Modified Rankin (Stroke Patients Only)       Balance Overall balance assessment: Needs assistance;History of Falls Sitting-balance support: Feet supported;No upper extremity supported Sitting balance-Leahy Scale: Good     Standing balance support: During functional activity;Bilateral upper extremity supported Standing balance-Leahy Scale: Fair                               Pertinent Vitals/Pain Pain Assessment: 0-10 Pain Score: 6  Pain Location: R ankle Pain Descriptors / Indicators: Aching Pain Intervention(s): Premedicated before session;Repositioned;Ice applied    Home Living Family/patient expects to be discharged to:: Private residence Living Arrangements: Alone Available Help at Discharge: Family Type of Home: House Home Access: Ramped  entrance     Home Layout: One level Home Equipment: Environmental consultantWalker - 2 wheels;Wheelchair - Careers advisermanual;Other (comment);Bedside commode Additional Comments: rented w/c    Prior Function Level of Independence: Independent with assistive device(s) (mostly from w/c)               Hand Dominance        Extremity/Trunk Assessment   Upper Extremity Assessment Upper Extremity Assessment: Overall WFL for tasks assessed    Lower Extremity Assessment Lower Extremity Assessment: RLE deficits/detail RLE Deficits / Details: in SLC/splint       Communication   Communication: No difficulties  Cognition Arousal/Alertness: Awake/alert Behavior During Therapy: WFL for tasks assessed/performed Overall Cognitive Status: Within Functional Limits for tasks assessed                                        General Comments      Exercises     Assessment/Plan    PT Assessment Patent does not need any further PT services  PT Problem List         PT Treatment Interventions      PT Goals (Current goals can be found in the Care Plan section)  Acute Rehab PT Goals Patient Stated Goal: to go home PT Goal Formulation: All assessment and education complete,  DC therapy    Frequency     Barriers to discharge        Co-evaluation PT/OT/SLP Co-Evaluation/Treatment: Yes Reason for Co-Treatment: For patient/therapist safety PT goals addressed during session: Mobility/safety with mobility OT goals addressed during session: ADL's and self-care       End of Session   Activity Tolerance: Patient tolerated treatment well Patient left: in bed;with call bell/phone within reach;with family/visitor present Nurse Communication: Mobility status      Time:  -1329     Charges:   PT Evaluation $PT Eval Low Complexity: 1 Procedure     PT G CodesBlanchard [name] PT [phone]   Rada Hay 03/14/2017, 2:46 PM

## 2017-03-14 NOTE — Discharge Summary (Signed)
Physician Discharge Summary  Patient ID: Chloe [name] MRN: [id number] DOB/AGE: 1946-12-18 71 y.o.  Admit date: 03/13/2017 Discharge date: 03/15/2017  Admission Diagnoses:  Ankle syndesmosis disruption, right, sequela  Discharge Diagnoses:  Principal Problem:   Ankle syndesmosis disruption, right, sequela Active Problems:   Syndesmotic disruption of ankle, right, sequela   Past Medical History:  Diagnosis Date  . Cervical spondylosis without myelopathy 08/10/2015  . Chronic kidney disease (CKD), stage III (moderate)   . Chronic low back pain 02/19/2017  . Chronic lower back pain   . Dissection of cerebral artery (HCC) 1997   "no OR"  . Endometriosis   . Family history of adverse reaction to anesthesia    "daughter has PONV"  . Fibromyalgia   . GERD (gastroesophageal reflux disease)   . High cholesterol    hx  . History of hiatal hernia   . Hypertension   . Obesity   . Occipital headache    "qd" (01/10/2017)  . Orthostatic hypotension 02/02/2015  . Plantar fasciitis   . Seasonal asthma   . Shingles   . Stomach ulcer    "I take RX for it" (01/10/2017)  . Stroke [facility]) 1997; 08/2016  . Type II diabetes mellitus (HCC)   . Vertebral artery dissection (HCC)    right     Surgeries: Procedure(s): HARDWARE REMOVAL OF RIGHT ABKLE, OPEN REDUCTION INTERNAL FIXATION OF SYNDESMOTIC INJURY on 03/13/2017   Consultants (if any):   Discharged Condition: Improved  Hospital Course: Chloe [name] is an 71 y.o. female who was admitted 03/13/2017 with a diagnosis of Ankle syndesmosis disruption, right, sequela and went to the operating room on 03/13/2017 and underwent the above named procedures.    She was given perioperative antibiotics:  Anti-infectives    Start     Dose/Rate Route Frequency Ordered Stop   03/13/17 2300  ceFAZolin (ANCEF) IVPB 2g/100 mL premix     2 g 200 mL/hr over 30 Minutes Intravenous Every 6 hours 03/13/17 2025 03/14/17 1321   03/13/17 1400  ceFAZolin (ANCEF)  IVPB 2g/100 mL premix     2 g 200 mL/hr over 30 Minutes Intravenous On call to O.R. 03/13/17 1355 03/13/17 1655    .  She was given sequential compression devices, early ambulation, and lovenox for DVT prophylaxis.  MRI Lumbar and cervical spine were ordered to evaluate for neural compression given persistent complaints of lower extremity weakness and were negative.  She benefited maximally from the hospital stay and there were no complications.    Recent vital signs:  Vitals:   03/15/17 0951 03/15/17 1649  BP: 123/67 (!) 111/56  Pulse: 64 67  Resp: 18 18  Temp:  98.6 F (37 C)    Recent laboratory studies:  Lab Results  Component Value Date   HGB 12.6 03/13/2017   HGB 12.2 01/20/2017   HGB 14.2 01/20/2017   Lab Results  Component Value Date   WBC 4.8 03/13/2017   PLT 172 03/13/2017   Lab Results  Component Value Date   INR 1.8 (H) 06/15/2007   Lab Results  Component Value Date   NA 143 03/15/2017   K 3.8 03/15/2017   CL 104 03/15/2017   CO2 33 (H) 03/15/2017   BUN 15 03/15/2017   CREATININE 1.31 (H) 03/15/2017   GLUCOSE 108 (H) 03/15/2017    Discharge Medications:   Allergies as of 03/15/2017      Reactions   Adhesive [tape] Hives, Rash   Codeine Nausea Only  Medication List    TAKE these medications   aspirin 81 MG tablet Take 81 mg by mouth daily.   Biotin 3 MG Tabs Take 300 mcg by mouth daily.   CALCIUM PO Take 1,000 mg by mouth daily.   Cinnamon 500 MG Tabs Take 1,000 mg by mouth 2 (two) times daily.   dicyclomine 20 MG tablet Commonly known as:  BENTYL Take 20 mg by mouth 3 (three) times daily.   docusate sodium 100 MG capsule Commonly known as:  COLACE Take 1 capsule (100 mg total) by mouth 2 (two) times daily.   DULoxetine 60 MG capsule Commonly known as:  CYMBALTA Take 1 capsule (60 mg total) by mouth 2 (two) times daily.   enoxaparin 30 MG/0.3ML injection Commonly known as:  LOVENOX Inject 0.3 mLs (30 mg total) into the  skin daily.   FIBER THERAPY 500 MG Tabs Generic drug:  Methylcellulose (Laxative) Take 1,000 mg by mouth daily.   Fish Oil 1200 MG Caps Take 1,200 mg by mouth 3 (three) times daily.   furosemide 40 MG tablet Commonly known as:  LASIX Take 80mg s every morning and 40mg s at 4pm   gabapentin 400 MG capsule Commonly known as:  NEURONTIN Take 2 caps in the morning, supper time and  Night . Take 3 caps at noon.   HUMALOG [place] Inject into the skin. 70-150 inject 3 units, 151-200 = 5 units, 201-250 = 7 units, 151-300 = 9 units, 301-350 = 11 units, 351-400 = 13 units, >401 = 16 units and notify Dr Patient does humalog with meals only   HYDROcodone-acetaminophen 5-325 MG tablet Commonly known as:  NORCO/VICODIN Take 1-2 tablets by mouth every 4 (four) hours as needed (breakthrough pain).   LANTUS 100 UNIT/ML injection Generic drug:  insulin glargine Inject into the skin. 40 units in the am and 10 in the pm   METAMUCIL PO Take 2 capsules by mouth daily.   PRILOSEC 20 MG capsule Generic drug:  omeprazole Take 20 mg by mouth 2 (two) times daily.   ramipril 10 MG capsule Commonly known as:  ALTACE Take 10 mg by mouth daily.   senna 8.6 MG Tabs tablet Commonly known as:  SENOKOT Take 1 tablet (8.6 mg total) by mouth 2 (two) times daily.   TOPROL XL 25 MG 24 hr tablet Generic drug:  metoprolol succinate Take 25 mg by mouth 2 (two) times daily.   Turmeric 500 MG Tabs Take 1,000 mg by mouth daily.   Vitamin D-3 1000 units Caps Take 1,000 Units by mouth 3 (three) times daily.   vitamin E 400 UNIT capsule Take 400 Units by mouth daily.   zolpidem 10 MG tablet Commonly known as:  AMBIEN Take 1 tablet (10 mg total) by mouth at bedtime as needed. for sleep       Diagnostic Studies: Dg Ankle Complete Right  Result Date: 03/13/2017 CLINICAL DATA:  Removal and ORIF replacement hardware right ankle EXAM: RIGHT ANKLE - COMPLETE 3+ VIEW COMPARISON:  01/20/2017 FINDINGS: Four low  resolution intraoperative spot views of the right ankle. Total fluoroscopy time was 1 minutes. Surgical plate and screw fixation of the distal fibula across a nondisplaced fracture. Three fixating long screws are seen across the distal fibula and tibia. Alignment appears anatomic. IMPRESSION: Intraoperative fluoroscopic assistance provided during ankle surgery Electronically Signed   By: Jasmine Pang M.D.   On: 03/13/2017 19:28   Mr Cervical Spine Wo Contrast  Result Date: 03/15/2017 CLINICAL DATA:  Bilateral leg  weakness. Evaluate for neural compression. EXAM: MRI CERVICAL SPINE WITHOUT CONTRAST TECHNIQUE: Multiplanar, multisequence MR imaging of the cervical spine was performed. No intravenous contrast was administered. COMPARISON:  MRI brain 07/27/2016.  Cervical spine CT 11/24/2014. FINDINGS: Alignment: Normal. Vertebrae: No acute or suspicious osseous findings. 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 Cord: Normal in signal and caliber. Posterior Fossa, vertebral arteries, paraspinal tissues: Mild atrophy is noted within the posterior fossa. There is a chronic empty sella which appears unchanged. No paraspinal abnormalities are seen. Bilateral vertebral artery flow voids. Disc levels: No significant disc space findings at or above C4-5. C5-6: There is loss of disc height with uncinate spurring and mild facet hypertrophy. No cord deformity or high-grade foraminal narrowing. C6-7: There is loss of disc height with uncinate spurring and mild bilateral facet hypertrophy. No cord deformity or significant foraminal narrowing. C7-T1:  Normal interspace. IMPRESSION: 1. Mild cervical spondylosis, greatest at C5-6 and C6-7. 2. No acute disc herniation, cord deformity or nerve root encroachment. 3. No abnormal cord signal. Electronically Signed   By: Carey Bullocks M.D.   On: 03/15/2017 16:04   Mr Lumbar Spine Wo Contrast  Result Date: 03/14/2017 CLINICAL DATA:  LEFT  lower extremity weakness. Evaluate neural impingement. EXAM: MRI LUMBAR SPINE WITHOUT CONTRAST TECHNIQUE: Multiplanar, multisequence MR imaging of the lumbar spine was performed. No intravenous contrast was administered. COMPARISON:  CT abdomen and pelvis 04/06/2013. FINDINGS: Segmentation:  Standard Alignment:  Exaggerated lumbar lordosis, but no subluxation. Vertebrae:  No worrisome osseous lesion.  Incidental hemangioma L5. Conus medullaris: Extends to the L1 level and appears normal. Paraspinal and other soft tissues: RIGHT extra renal pelvis. Prominent common bile duct likely post cholecystectomy sequelae. No aortic enlargement. Disc levels: L1-L2:  Normal. L2-L3: Central protrusion. Posterior element hypertrophy. No impingement. L3-L4: Annular bulge. Posterior element hypertrophy. No impingement. L4-L5: Annular bulge. Posterior element hypertrophy. BILATERAL facet joint effusions. Mild stenosis. No subarticular zone or foraminal zone narrowing. L5-S1:  Annular bulge.  Facet arthropathy.  No visible impingement. Similar appearance to prior CT. IMPRESSION: Mild multilevel spondylosis. No significant neural impingement. No specific abnormality is seen which might cause LEFT lower extremity weakness. Electronically Signed   By: Elsie Stain M.D.   On: 03/14/2017 19:57   Dg Ankle Right Port  Result Date: 03/13/2017 CLINICAL DATA:  Postop hardware removal right ankle EXAM: PORTABLE RIGHT ANKLE - 2 VIEW COMPARISON:  01/20/2017 FINDINGS: Cast material obscures bone detail. Surgical plate and multiple screw fixation of the fibula across a healing fracture involving the distal shaft. Now seen are 3 long fixating screws across the distal fibula and tibia. Metallic density along the medial cortex of the tibia. Fracture fragment adjacent to the medial malleolus. Avulsion fracture fragment from the posterior malleolus. IMPRESSION: Surgical plate and screw fixation of the distal fibula with placement of 3 fixating  screws across the distal fibula and fibula. Fractures of the medial and posterior malleolus a. Healing distal fibular fracture. Electronically Signed   By: Jasmine Pang M.D.   On: 03/13/2017 19:27   Dg C-arm 1-60 Min-no Report  Result Date: 03/13/2017 Fluoroscopy was utilized by the requesting physician.  No radiographic interpretation.    Disposition: 01-Home or Self Care  Discharge Instructions    Call MD / Call 911    Complete by:  As directed    If you experience chest pain or shortness of breath, CALL 911 and be transported to the hospital emergency room.  If you develope  a fever above 101 F, pus (white drainage) or increased drainage or redness at the wound, or calf pain, call your surgeon's office.   Constipation Prevention    Complete by:  As directed    Drink plenty of fluids.  Prune juice may be helpful.  You may use a stool softener, such as Colace (over the counter) 100 mg twice a day.  Use MiraLax (over the counter) for constipation as needed.   Diet - low sodium heart healthy    Complete by:  As directed    Increase activity slowly as tolerated    Complete by:  As directed       Follow-up Information    Lus Kriegel, Cloyde ReamsBrian James, MD. Schedule an appointment as soon as possible for a visit in 2 weeks.   Specialty:  Orthopedic Surgery Why:  For suture removal, For wound re-check Contact information: [address] [place] [address] [phone]            Signed: Garnet KoyanagiSwinteck, Amarilys Lyles James 03/18/2017, 5:41 PM

## 2017-03-14 NOTE — Evaluation (Signed)
Occupational Therapy Evaluation Patient Details Name: Chloe [name] MRN: [id number] DOB: Feb 04, 1946 Today's Date: 03/14/2017    History of Present Illness HARDWARE REMOVAL OF RIGHT ABKLE, OPEN REDUCTION INTERNAL FIXATION OF SYNDESMOTIC INJURY (Right) -W/U  for left LE weakness: MRI L spine pending to rule out neural compression, may need MRI c spine if L spine is negative   Clinical Impression   This 71 year old female was admitted for the above. She has had several ankle sxs and is mod I for adls.  No further OT is needed at this time    Follow Up Recommendations  No OT follow up    Equipment Recommendations  None recommended by OT    Recommendations for Other Services       Precautions / Restrictions Precautions Precautions: Fall Restrictions Weight Bearing Restrictions: Yes Other Position/Activity Restrictions: NWB      Mobility Bed Mobility Overal bed mobility: Independent                Transfers Overall transfer level: Modified independent               General transfer comment: able to stand and pivot bed to [facility] and back to bed.    Balance Overall balance assessment: Needs assistance;History of Falls Sitting-balance support: Feet supported;No upper extremity supported Sitting balance-Leahy Scale: Good     Standing balance support: During functional activity;Bilateral upper extremity supported Standing balance-Leahy Scale: Fair                             ADL either performed or assessed with clinical judgement   ADL Overall ADL's : Modified independent                                       General ADL Comments: Performed toilet transfer and hygiene.  Pt safe and able to maintain NWB for hygiene. She uses a w/c at home and hops a few steps into bathroom due to small door.       Vision         Perception     Praxis      Pertinent Vitals/Pain Pain Assessment: 0-10 Pain Score: 6  Pain Location: R  ankle Pain Descriptors / Indicators: Aching Pain Intervention(s): Premedicated before session;Repositioned;Ice applied     Hand Dominance     Extremity/Trunk Assessment Upper Extremity Assessment Upper Extremity Assessment: Overall WFL for tasks assessed          Communication Communication Communication: No difficulties   Cognition Arousal/Alertness: Awake/alert Behavior During Therapy: WFL for tasks assessed/performed Overall Cognitive Status: Within Functional Limits for tasks assessed                                     General Comments       Exercises     Shoulder Instructions      Home Living Family/patient expects to be discharged to:: Private residence Living Arrangements: Alone Available Help at Discharge: Family Type of Home: House Home Access: Ramped entrance     Home Layout: One level         Bathroom Toilet: Handicapped height     Home Equipment: Environmental consultant - 2 wheels;Wheelchair - Careers adviser (comment);Bedside commode   Additional Comments: rented w/c  Prior Functioning/Environment Level of Independence: Independent with assistive device(s) (mostly from w/c)                 OT Problem List:        OT Treatment/Interventions:      OT Goals(Current goals can be found in the care plan section) Acute Rehab OT Goals Patient Stated Goal: to go home OT Goal Formulation: All assessment and education complete, DC therapy  OT Frequency:     Barriers to D/C:            Co-evaluation   Reason for Co-Treatment: For patient/therapist safety PT goals addressed during session: Mobility/safety with mobility OT goals addressed during session: ADL's and self-care      End of Session    Activity Tolerance: Patient tolerated treatment well Patient left: in bed;with call bell/phone within reach;with family/visitor present  OT Visit Diagnosis: Pain Pain - Right/Left: Right Pain - part of body: Ankle and joints of foot                 Time: [id number] OT Time Calculation (min): 23 min Charges:  OT General Charges $OT Visit: 1 Procedure OT Evaluation $OT Eval Low Complexity: 1 Procedure G-Codes:     [place] Brielle Moro, OTR/L [phone] 03/14/2017  Douglas Smolinsky 03/14/2017, 2:50 PM

## 2017-03-15 ENCOUNTER — Observation Stay (HOSPITAL_COMMUNITY): Payer: Medicare Other

## 2017-03-15 DIAGNOSIS — S93432D Sprain of tibiofibular ligament of left ankle, subsequent encounter: Secondary | ICD-10-CM | POA: Diagnosis not present

## 2017-03-15 LAB — BASIC METABOLIC PANEL
Anion gap: 6 (ref 5–15)
BUN: 15 mg/dL (ref 6–20)
CHLORIDE: 104 mmol/L (ref 101–111)
CO2: 33 mmol/L — AB (ref 22–32)
CREATININE: 1.31 mg/dL — AB (ref 0.44–1.00)
Calcium: 9 mg/dL (ref 8.9–10.3)
GFR calc Af Amer: 47 mL/min — ABNORMAL LOW (ref >60)
GFR calc non Af Amer: 41 mL/min — ABNORMAL LOW (ref >60)
GLUCOSE: 108 mg/dL — AB (ref 65–99)
Potassium: 3.8 mmol/L (ref 3.5–5.1)
SODIUM: 143 mmol/L (ref 135–145)

## 2017-03-15 LAB — GLUCOSE, CAPILLARY
GLUCOSE-CAPILLARY: 109 mg/dL — AB (ref 65–99)
Glucose-Capillary: 162 mg/dL — ABNORMAL HIGH (ref 65–99)

## 2017-03-15 NOTE — Discharge Summary (Signed)
Physician Discharge Summary   Patient ID: Chloe [name] MRN: [id number] DOB/AGE: 1945-12-28 71 y.o.  Admit date: 03/13/2017 Discharge date: 03/15/2017  Admission Diagnoses:  Principal Problem:   Ankle syndesmosis disruption, right, sequela Active Problems:   Syndesmotic disruption of ankle, right, sequela   Discharge Diagnoses:  Same   Surgeries: Procedure(s): HARDWARE REMOVAL OF RIGHT ABKLE, OPEN REDUCTION INTERNAL FIXATION OF SYNDESMOTIC INJURY on 03/13/2017   Consultants: none  Discharged Condition: Stable  Hospital Course: Chloe [name] is an 71 y.o. female who was admitted 03/13/2017 with a chief complaint of right ankle pain, and found to have a diagnosis of Ankle syndesmosis disruption, right, sequela.  They were brought to the operating room on 03/13/2017 and underwent the above named procedures.    The patient had an uncomplicated hospital course and was stable for discharge.  Recent vital signs:  Vitals:   03/15/17 0525 03/15/17 0951  BP: (!) 100/54 123/67  Pulse: 68 64  Resp: 16 18  Temp: 98.5 F (36.9 C)     Recent laboratory studies:  Results for orders placed or performed during the hospital encounter of 03/13/17  Basic metabolic panel  Result Value Ref Range   Sodium 142 135 - 145 mmol/L   Potassium 4.1 3.5 - 5.1 mmol/L   Chloride 102 101 - 111 mmol/L   CO2 32 22 - 32 mmol/L   Glucose, Bld 63 (L) 65 - 99 mg/dL   BUN 22 (H) 6 - 20 mg/dL   Creatinine, Ser 4.09 (H) 0.44 - 1.00 mg/dL   Calcium 9.5 8.9 - 81.1 mg/dL   GFR calc non Af Amer 35 (L) >60 mL/min   GFR calc Af Amer 41 (L) >60 mL/min   Anion gap 8 5 - 15  CBC  Result Value Ref Range   WBC 4.8 4.0 - 10.5 K/uL   RBC 4.26 3.87 - 5.11 MIL/uL   Hemoglobin 12.6 12.0 - 15.0 g/dL   HCT 91.4 78.2 - 95.6 %   MCV 90.6 78.0 - 100.0 fL   MCH 29.6 26.0 - 34.0 pg   MCHC 32.6 30.0 - 36.0 g/dL   RDW 21.3 08.6 - 57.8 %   Platelets 172 150 - 400 K/uL  Hemoglobin A1c  Result Value Ref Range   Hgb A1c  MFr Bld 5.5 4.8 - 5.6 %   Mean Plasma Glucose 111 mg/dL  Glucose, capillary  Result Value Ref Range   Glucose-Capillary 63 (L) 65 - 99 mg/dL  Glucose, capillary  Result Value Ref Range   Glucose-Capillary 49 (L) 65 - 99 mg/dL  Glucose, capillary  Result Value Ref Range   Glucose-Capillary 75 65 - 99 mg/dL  Glucose, capillary  Result Value Ref Range   Glucose-Capillary 82 65 - 99 mg/dL  Glucose, capillary  Result Value Ref Range   Glucose-Capillary 92 65 - 99 mg/dL  Glucose, capillary  Result Value Ref Range   Glucose-Capillary 112 (H) 65 - 99 mg/dL  Glucose, capillary  Result Value Ref Range   Glucose-Capillary 93 65 - 99 mg/dL  Glucose, capillary  Result Value Ref Range   Glucose-Capillary 79 65 - 99 mg/dL  Glucose, capillary  Result Value Ref Range   Glucose-Capillary 146 (H) 65 - 99 mg/dL  Basic metabolic panel  Result Value Ref Range   Sodium 143 135 - 145 mmol/L   Potassium 3.8 3.5 - 5.1 mmol/L   Chloride 104 101 - 111 mmol/L   CO2 33 (H) 22 - 32 mmol/L  Glucose, Bld 108 (H) 65 - 99 mg/dL   BUN 15 6 - 20 mg/dL   Creatinine, Ser 1.61 (H) 0.44 - 1.00 mg/dL   Calcium 9.0 8.9 - 09.6 mg/dL   GFR calc non Af Amer 41 (L) >60 mL/min   GFR calc Af Amer 47 (L) >60 mL/min   Anion gap 6 5 - 15  Glucose, capillary  Result Value Ref Range   Glucose-Capillary 107 (H) 65 - 99 mg/dL  Glucose, capillary  Result Value Ref Range   Glucose-Capillary 117 (H) 65 - 99 mg/dL  Glucose, capillary  Result Value Ref Range   Glucose-Capillary 109 (H) 65 - 99 mg/dL  Glucose, capillary  Result Value Ref Range   Glucose-Capillary 162 (H) 65 - 99 mg/dL    Discharge Medications:   Allergies as of 03/15/2017      Reactions   Adhesive [tape] Hives, Rash   Codeine Nausea Only      Medication List    TAKE these medications   aspirin 81 MG tablet Take 81 mg by mouth daily.   Biotin 3 MG Tabs Take 300 mcg by mouth daily.   CALCIUM PO Take 1,000 mg by mouth daily.   Cinnamon 500  MG Tabs Take 1,000 mg by mouth 2 (two) times daily.   dicyclomine 20 MG tablet Commonly known as:  BENTYL Take 20 mg by mouth 3 (three) times daily.   docusate sodium 100 MG capsule Commonly known as:  COLACE Take 1 capsule (100 mg total) by mouth 2 (two) times daily.   DULoxetine 60 MG capsule Commonly known as:  CYMBALTA Take 1 capsule (60 mg total) by mouth 2 (two) times daily.   enoxaparin 30 MG/0.3ML injection Commonly known as:  LOVENOX Inject 0.3 mLs (30 mg total) into the skin daily.   FIBER THERAPY 500 MG Tabs Generic drug:  Methylcellulose (Laxative) Take 1,000 mg by mouth daily.   Fish Oil 1200 MG Caps Take 1,200 mg by mouth 3 (three) times daily.   furosemide 40 MG tablet Commonly known as:  LASIX Take 80mg s every morning and 40mg s at 4pm   gabapentin 400 MG capsule Commonly known as:  NEURONTIN Take 2 caps in the morning, supper time and  Night . Take 3 caps at noon.   HUMALOG [place] Inject into the skin. 70-150 inject 3 units, 151-200 = 5 units, 201-250 = 7 units, 151-300 = 9 units, 301-350 = 11 units, 351-400 = 13 units, >401 = 16 units and notify Dr Patient does humalog with meals only   HYDROcodone-acetaminophen 5-325 MG tablet Commonly known as:  NORCO/VICODIN Take 1-2 tablets by mouth every 4 (four) hours as needed (breakthrough pain).   LANTUS 100 UNIT/ML injection Generic drug:  insulin glargine Inject into the skin. 40 units in the am and 10 in the pm   METAMUCIL PO Take 2 capsules by mouth daily.   PRILOSEC 20 MG capsule Generic drug:  omeprazole Take 20 mg by mouth 2 (two) times daily.   ramipril 10 MG capsule Commonly known as:  ALTACE Take 10 mg by mouth daily.   senna 8.6 MG Tabs tablet Commonly known as:  SENOKOT Take 1 tablet (8.6 mg total) by mouth 2 (two) times daily.   TOPROL XL 25 MG 24 hr tablet Generic drug:  metoprolol succinate Take 25 mg by mouth 2 (two) times daily.   Turmeric 500 MG Tabs Take 1,000 mg by mouth  daily.   Vitamin D-3 1000 units Caps Take 1,000  Units by mouth 3 (three) times daily.   vitamin E 400 UNIT capsule Take 400 Units by mouth daily.   zolpidem 10 MG tablet Commonly known as:  AMBIEN Take 1 tablet (10 mg total) by mouth at bedtime as needed. for sleep       Diagnostic Studies: Dg Ankle Complete Right  Result Date: 03/13/2017 CLINICAL DATA:  Removal and ORIF replacement hardware right ankle EXAM: RIGHT ANKLE - COMPLETE 3+ VIEW COMPARISON:  01/20/2017 FINDINGS: Four low resolution intraoperative spot views of the right ankle. Total fluoroscopy time was 1 minutes. Surgical plate and screw fixation of the distal fibula across a nondisplaced fracture. Three fixating long screws are seen across the distal fibula and tibia. Alignment appears anatomic. IMPRESSION: Intraoperative fluoroscopic assistance provided during ankle surgery Electronically Signed   By: Jasmine PangKim  Fujinaga M.D.   On: 03/13/2017 19:28   Mr Cervical Spine Wo Contrast  Result Date: 03/15/2017 CLINICAL DATA:  Bilateral leg weakness. Evaluate for neural compression. EXAM: MRI CERVICAL SPINE WITHOUT CONTRAST TECHNIQUE: Multiplanar, multisequence MR imaging of the cervical spine was performed. No intravenous contrast was administered. COMPARISON:  MRI brain 07/27/2016.  Cervical spine CT 11/24/2014. FINDINGS: Alignment: Normal. Vertebrae: No acute or suspicious osseous findings. 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 Cord: Normal in signal and caliber. Posterior Fossa, vertebral arteries, paraspinal tissues: Mild atrophy is noted within the posterior fossa. There is a chronic empty sella which appears unchanged. No paraspinal abnormalities are seen. Bilateral vertebral artery flow voids. Disc levels: No significant disc space findings at or above C4-5. C5-6: There is loss of disc height with uncinate spurring and mild facet hypertrophy. No cord deformity or high-grade foraminal  narrowing. C6-7: There is loss of disc height with uncinate spurring and mild bilateral facet hypertrophy. No cord deformity or significant foraminal narrowing. C7-T1:  Normal interspace. IMPRESSION: 1. Mild cervical spondylosis, greatest at C5-6 and C6-7. 2. No acute disc herniation, cord deformity or nerve root encroachment. 3. No abnormal cord signal. Electronically Signed   By: Carey BullocksWilliam  Veazey M.D.   On: 03/15/2017 16:04   Mr Lumbar Spine Wo Contrast  Result Date: 03/14/2017 CLINICAL DATA:  LEFT lower extremity weakness. Evaluate neural impingement. EXAM: MRI LUMBAR SPINE WITHOUT CONTRAST TECHNIQUE: Multiplanar, multisequence MR imaging of the lumbar spine was performed. No intravenous contrast was administered. COMPARISON:  CT abdomen and pelvis 04/06/2013. FINDINGS: Segmentation:  Standard Alignment:  Exaggerated lumbar lordosis, but no subluxation. Vertebrae:  No worrisome osseous lesion.  Incidental hemangioma L5. Conus medullaris: Extends to the L1 level and appears normal. Paraspinal and other soft tissues: RIGHT extra renal pelvis. Prominent common bile duct likely post cholecystectomy sequelae. No aortic enlargement. Disc levels: L1-L2:  Normal. L2-L3: Central protrusion. Posterior element hypertrophy. No impingement. L3-L4: Annular bulge. Posterior element hypertrophy. No impingement. L4-L5: Annular bulge. Posterior element hypertrophy. BILATERAL facet joint effusions. Mild stenosis. No subarticular zone or foraminal zone narrowing. L5-S1:  Annular bulge.  Facet arthropathy.  No visible impingement. Similar appearance to prior CT. IMPRESSION: Mild multilevel spondylosis. No significant neural impingement. No specific abnormality is seen which might cause LEFT lower extremity weakness. Electronically Signed   By: Elsie StainJohn T Curnes M.D.   On: 03/14/2017 19:57   Dg Ankle Right Port  Result Date: 03/13/2017 CLINICAL DATA:  Postop hardware removal right ankle EXAM: PORTABLE RIGHT ANKLE - 2 VIEW  COMPARISON:  01/20/2017 FINDINGS: Cast material obscures bone detail. Surgical plate and multiple screw fixation of the fibula across a healing  fracture involving the distal shaft. Now seen are 3 long fixating screws across the distal fibula and tibia. Metallic density along the medial cortex of the tibia. Fracture fragment adjacent to the medial malleolus. Avulsion fracture fragment from the posterior malleolus. IMPRESSION: Surgical plate and screw fixation of the distal fibula with placement of 3 fixating screws across the distal fibula and fibula. Fractures of the medial and posterior malleolus a. Healing distal fibular fracture. Electronically Signed   By: Jasmine Pang M.D.   On: 03/13/2017 19:27   Dg C-arm 1-60 Min-no Report  Result Date: 03/13/2017 Fluoroscopy was utilized by the requesting physician.  No radiographic interpretation.    Disposition: 03-Skilled Nursing Facility  Discharge Instructions    Call MD / Call 911    Complete by:  As directed    If you experience chest pain or shortness of breath, CALL 911 and be transported to the hospital emergency room.  If you develope a fever above 101 F, pus (white drainage) or increased drainage or redness at the wound, or calf pain, call your surgeon's office.   Constipation Prevention    Complete by:  As directed    Drink plenty of fluids.  Prune juice may be helpful.  You may use a stool softener, such as Colace (over the counter) 100 mg twice a day.  Use MiraLax (over the counter) for constipation as needed.   Diet - low sodium heart healthy    Complete by:  As directed    Increase activity slowly as tolerated    Complete by:  As directed       Follow-up Information    Swinteck, Cloyde Reams, MD. Schedule an appointment as soon as possible for a visit in 2 weeks.   Specialty:  Orthopedic Surgery Why:  For suture removal, For wound re-check Contact information: [address] [place] [address] [phone]             Signed: Thea Gist 03/15/2017, 4:12 PM

## 2017-03-15 NOTE — Progress Notes (Signed)
   Subjective: 2 Days Post-Op Procedure(s) (LRB): HARDWARE REMOVAL OF RIGHT ABKLE, OPEN REDUCTION INTERNAL FIXATION OF SYNDESMOTIC INJURY (Right)  Pt had episode of epistaxis while in MRI scanner due to 'heat in the room' Pt scheduled for MRI of cervical spine later today Plan to d/c home once MRI completed Ankle stable currently Patient reports pain as mild.  Objective:   VITALS:   Vitals:   03/14/17 2211 03/15/17 0525  BP: (!) 148/75 (!) 100/54  Pulse: 73 68  Resp: 16 16  Temp: 98.8 F (37.1 C) 98.5 F (36.9 C)    Right ankle with dressing and splint intact No rashes distally nv intact distally No signs of current epistaxis  LABS  Recent Labs  03/13/17 1400  HGB 12.6  HCT 38.6  WBC 4.8  PLT 172     Recent Labs  03/13/17 1400 03/15/17 0422  NA 142 143  K 4.1 3.8  BUN 22* 15  CREATININE 1.47* 1.31*  GLUCOSE 63* 108*     Assessment/Plan: 2 Days Post-Op Procedure(s) (LRB): HARDWARE REMOVAL OF RIGHT ABKLE, OPEN REDUCTION INTERNAL FIXATION OF SYNDESMOTIC INJURY (Right) Plan to d/c home after MRI of cervical spine Please page me for discharge order Pain management as needed    Alphonsa OverallBrad Coulter Oldaker, MPAS, PA-C  03/15/2017, 7:35 AM

## 2017-03-15 NOTE — Progress Notes (Signed)
While  teaching patient about taking Lovenox injection at home after discharge. She states, " I do not want to take Lovenox"  Because of her  recent episodes of nose bleeds. Explained to patient that the medication is the best way to prevent blood clots given her recent surgery. Patient states she understands but she does not want to take it. Patient states she will just take an aspirin. Notified Alphonsa OverallBrad Dixon, [place] .  Patient will be discharged to home.

## 2017-03-18 NOTE — Progress Notes (Signed)
   03/14/17 1444  OT Time Calculation  OT Start Time (ACUTE ONLY) 1303  OT Stop Time (ACUTE ONLY) 1326  OT Time Calculation (min) 23 min  OT G-codes **NOT FOR INPATIENT CLASS**  Functional Assessment Tool Used Clinical judgement  Functional Limitation Self care  Self Care Current Status (W0981(G8987) [facility]  Self Care Goal Status (X9147(G8988) [facility]  Self Care Discharge Status (W2956(G8989) [facility]  OT General Charges  $OT Visit 1 Procedure  OT Evaluation  $OT Eval Low Complexity 1 Procedure  late g codes Marica OtterMaryellen Ronald Londo, OTR/L [phone] 03/18/2017

## 2017-03-21 NOTE — Progress Notes (Signed)
   03/14/17 1444  PT Time Calculation  PT Stop Time (ACUTE ONLY) 1329  PT G-Codes **NOT FOR INPATIENT CLASS**  Functional Assessment Tool Used AM-PAC 6 Clicks Basic Mobility;Clinical judgement  Functional Limitation Mobility: Walking and moving around  Mobility: Walking and Moving Around Current Status (I6962) [facility]  Mobility: Walking and Moving Around Goal Status (X5284) [facility]  Mobility: Walking and Moving Around Discharge Status (X3244) [facility]  PT General Charges  $$ ACUTE PT VISIT 1 Procedure  PT Evaluation  $PT Eval Low Complexity 1 Procedure  Blanchard Kelch PT [phone]

## 2017-03-24 NOTE — Anesthesia Postprocedure Evaluation (Addendum)
Anesthesia Post Note  Patient: Chloe [name]  Procedure(s) Performed: Procedure(s) (LRB): HARDWARE REMOVAL OF RIGHT ABKLE, OPEN REDUCTION INTERNAL FIXATION OF SYNDESMOTIC INJURY (Right)  Patient location during evaluation: PACU Anesthesia Type: General Level of consciousness: awake and alert and patient cooperative Pain management: pain level controlled Vital Signs Assessment: post-procedure vital signs reviewed and stable Respiratory status: spontaneous breathing and respiratory function stable Cardiovascular status: stable Anesthetic complications: no       Last Vitals:  Vitals:   03/15/17 0951 03/15/17 1649  BP: 123/67 (!) 111/56  Pulse: 64 67  Resp: 18 18  Temp:  37 C    Last Pain:  Vitals:   03/15/17 1649  TempSrc: Oral  PainSc:                  Tonea Leiphart S

## 2017-06-09 ENCOUNTER — Other Ambulatory Visit: Payer: Self-pay | Admitting: Neurology

## 2017-06-09 NOTE — Telephone Encounter (Signed)
Faxed printed/signed rx ambien to pt pharmacy. Fax: [phone]. Received confirmation.

## 2017-07-04 ENCOUNTER — Telehealth: Payer: Self-pay | Admitting: Neurology

## 2017-07-04 ENCOUNTER — Other Ambulatory Visit: Payer: Self-pay | Admitting: Neurology

## 2017-07-04 ENCOUNTER — Telehealth: Payer: Self-pay | Admitting: *Deleted

## 2017-07-04 MED ORDER — SUVOREXANT 10 MG PO TABS: 10.0000 mg | ORAL_TABLET | Freq: Every evening | ORAL | 3 refills | Status: DC | PRN

## 2017-07-04 NOTE — Telephone Encounter (Signed)
Called and spoke with pt. Advised I am working on PA for [employer]. Insurance wanting to know if she has tried belsomra or rozerem before. She denies having tried belsomra or rozerem in the past. Advised she may have to try other medications first before [place]. I am going to call insurance and give them the update. We will keep her updated. She verbalized understanding.

## 2017-07-04 NOTE — Telephone Encounter (Signed)
Submitted PA ambien on covermymeds.  Key: [id number] - PA Case ID: [id number] - Rx #: [id number]: [id number]. Waiting on response from insurance ([employer] Medicare Part D) Need help? Call us at [phone]

## 2017-07-04 NOTE — Addendum Note (Signed)
Addended by: York SpanielWILLIS, Audi Conover K on: 07/04/2017 02:02 PM   Modules accepted: Orders

## 2017-07-04 NOTE — Telephone Encounter (Signed)
Called [employer] back. Pt ZO#[id number] Spoke with Prescious. She advised PA already sent to pharmacist and pending review. She was unable to take any further information. I advised that they called wanting more clarification. I asked to speak with pharmacist. She placed me on hold. She took me off hold and stated she spoke with Environmental managerLola (pharmacist).  I answered two questions: pt has NOT tried/failed belsomra or rozerem MD aware Remus Lofflerambien high risk medication for pt over age of 69  She will put note on PA and pharmacist will review.   Pending response from insurance.

## 2017-07-04 NOTE — Telephone Encounter (Signed)
Charlene with [employer] called office requesting additional information on patient PA.  Would like to know if patient has tried Belsomra or Rozerem? Patients Case #-[id number].   Please call

## 2017-07-04 NOTE — Telephone Encounter (Signed)
Dr Anne HahnWillis- how do you want to proceed? Pt has not tried either of those listed below.   Received fax from [employer] that PA denied for ambien. It was denied d/t "History of failure, contraindication, or intolerance to both of the following: belsomra AND rozerem"

## 2017-07-04 NOTE — Telephone Encounter (Signed)
error 

## 2017-07-04 NOTE — Telephone Encounter (Signed)
I called patient. The insurance will not cover Ambien, the appearance to be willing to cover Belsomra, I will call in a prescription for this, the patient is amenable to this medication change.

## 2017-07-04 NOTE — Telephone Encounter (Signed)
Called [employer] to provide clinical info. Spoke with Adrianna. She stated their system was down and to call back around 1pm.

## 2017-07-04 NOTE — Telephone Encounter (Signed)
Faxed printed/signed rx ambien to pt pharmacy. Fax: [phone]. Received confirmation.

## 2017-07-07 NOTE — Telephone Encounter (Signed)
Faxed printed/signed rx Belsomra to pt pharmacy. Fax: [phone]. Received confirmation.

## 2017-07-11 ENCOUNTER — Telehealth: Payer: Self-pay | Admitting: *Deleted

## 2017-07-11 NOTE — Telephone Encounter (Signed)
Spoke with [place] from [employer]. She requested if appeal for ambien needed to be withdrawn or not. Advised they could withdraw. We sent in rx belsomra instead for pt since ambien not covered. She verbalized understanding and nothing further needed at this time.

## 2017-08-13 NOTE — Addendum Note (Signed)
Addendum  created 08/13/17 1246 by Achille Rich, MD   Sign clinical note

## 2017-08-14 NOTE — Addendum Note (Signed)
Addendum  created 08/14/17 1023 by Kipp Brood, MD   Sign clinical note

## 2017-08-19 ENCOUNTER — Ambulatory Visit (INDEPENDENT_AMBULATORY_CARE_PROVIDER_SITE_OTHER): Payer: Medicare Other | Admitting: Adult Health

## 2017-08-19 ENCOUNTER — Encounter: Payer: Self-pay | Admitting: Adult Health

## 2017-08-19 VITALS — BP 103/66 | HR 62 | Ht 63.0 in | Wt 253.8 lb

## 2017-08-19 DIAGNOSIS — R269 Unspecified abnormalities of gait and mobility: Secondary | ICD-10-CM

## 2017-08-19 DIAGNOSIS — M545 Low back pain, unspecified: Secondary | ICD-10-CM

## 2017-08-19 DIAGNOSIS — G8929 Other chronic pain: Secondary | ICD-10-CM

## 2017-08-19 NOTE — Progress Notes (Signed)
PATIENT: Chloe [name] DOB: Jan 05, 1946  REASON FOR VISIT: follow up- back pain HISTORY FROM: patient   HISTORY OF PRESENT ILLNESS: Today 08/19/17: Chloe [name] is a 71 year old female with a history of chronic low back pain. She returns today for follow-up. She reports that she continues to have low back pain and difficulty with her gait. She states that occasionally she will have trouble with her balance. She does not report any additional falls. She uses a cane when ambulating however she did not bring it with her today. She states that due to her back. She states that she she sits most sitting or standing. She states that she did go through physical therapy In June. She states that after stopping physical therapy she's been going to the gym. She reports that she was using the bicycle and possibly injured her back. She states that for the last week she's been "taking it easy." She is on gabapentin and Cymbalta for back pain as well as fibromyalgia. She feels that these may offer her some benefit. She states that she is trying to wean off gabapentin but was unable to tolerate her discomfort. Her daughter is with her today. She states that she finds that her mother is very drowsy throughout the day. She would like to see some of her medications reduced however the patient reports that she cannot tolerate a decrease in her medication. She returns today for an evaluation.   HISTORY Chloe [name] is a 71 year old right-handed white female with a history of significant obesity, chronic low back pain, and a history of cervical spondylosis. The patient has been on Cymbalta and gabapentin for symptoms of fibromyalgia and of the low back pain. The patient unfortunately has had a fall that occurred in January 2018. The patient had a severe fracture of the tibia and fibula of the right leg, also had a more minor fracture of the left leg. The patient has had an apparent stroke evaluation in September 2017 for left  leg weakness. The patient still feels that the left leg is somewhat weak. The patient reports pain with flexion at the hip on the left. When she is up walking, the pain seems to go away. The patient returns to the office today for an evaluation. She is still not able to bear weight following the right leg fracture.   REVIEW OF SYSTEMS: Out of a complete 14 system review of symptoms, the patient complains only of the following symptoms, and all other reviewed systems are negative.  See history of present illness  ALLERGIES: Allergies  Allergen Reactions  . Adhesive [Tape] Hives and Rash  . Codeine Nausea Only    HOME MEDICATIONS: Outpatient Medications Prior to Visit  Medication Sig Dispense Refill  . aspirin 81 MG tablet Take 81 mg by mouth daily.    . Biotin 3 MG TABS Take 300 mcg by mouth daily.     Marland Kitchen CALCIUM PO Take 1,000 mg by mouth daily.     . Cinnamon 500 MG TABS Take 1,000 mg by mouth 2 (two) times daily.     Marland Kitchen dicyclomine (BENTYL) 20 MG tablet Take 20 mg by mouth 3 (three) times daily.     Marland Kitchen docusate sodium (COLACE) 100 MG capsule Take 1 capsule (100 mg total) by mouth 2 (two) times daily. 10 capsule 0  . DULoxetine (CYMBALTA) 60 MG capsule Take 1 capsule (60 mg total) by mouth 2 (two) times daily. 180 capsule 3  . enoxaparin (LOVENOX) 30  MG/0.3ML injection Inject 0.3 mLs (30 mg total) into the skin daily. 30 Syringe 0  . furosemide (LASIX) 40 MG tablet Take 80mg s every morning and 40mg s at 4pm    . gabapentin (NEURONTIN) 400 MG capsule Take 2 caps in the morning, supper time and  Night . Take 3 caps at noon. 810 capsule 3  . HYDROcodone-acetaminophen (NORCO/VICODIN) 5-325 MG tablet Take 1-2 tablets by mouth every 4 (four) hours as needed (breakthrough pain). 30 tablet 0  . insulin glargine (LANTUS) 100 UNIT/ML injection Inject into the skin. 40 units in the am and 10 in the pm    . Insulin Lispro, Human, (HUMALOG [place]) Inject into the skin. 70-150 inject 3 units, 151-200 = 5  units, 201-250 = 7 units, 151-300 = 9 units, 301-350 = 11 units, 351-400 = 13 units, >401 = 16 units and notify Dr Patient does humalog with meals only    . Methylcellulose, Laxative, (FIBER THERAPY) 500 MG TABS Take 1,000 mg by mouth daily.    . metoprolol succinate (TOPROL XL) 25 MG 24 hr tablet Take 25 mg by mouth 2 (two) times daily.    . Omega-3 Fatty Acids (FISH OIL) 1200 MG CAPS Take 1,200 mg by mouth 3 (three) times daily.    Marland Kitchen omeprazole (PRILOSEC) 20 MG capsule Take 20 mg by mouth 2 (two) times daily.     . Psyllium (METAMUCIL PO) Take 2 capsules by mouth daily.    . ramipril (ALTACE) 10 MG capsule Take 10 mg by mouth daily.     Marland Kitchen senna (SENOKOT) 8.6 MG TABS tablet Take 1 tablet (8.6 mg total) by mouth 2 (two) times daily. (Patient taking differently: Take 1 tablet by mouth at bedtime. ) 120 each 0  . Turmeric 500 MG TABS Take 1,000 mg by mouth daily.    . vitamin E 400 UNIT capsule Take 400 Units by mouth daily.    . Cholecalciferol (VITAMIN D-3) 1000 UNITS CAPS Take 1,000 Units by mouth 3 (three) times daily.    . Suvorexant (BELSOMRA) 10 MG TABS Take 10 mg by mouth at bedtime as needed. (Patient not taking: Reported on 08/19/2017) 30 tablet 3   No facility-administered medications prior to visit.     PAST MEDICAL HISTORY: Past Medical History:  Diagnosis Date  . Cervical spondylosis without myelopathy 08/10/2015  . Chronic kidney disease (CKD), stage III (moderate)   . Chronic low back pain 02/19/2017  . Chronic lower back pain   . Dissection of cerebral artery (HCC) 1997   "no OR"  . Endometriosis   . Family history of adverse reaction to anesthesia    "daughter has PONV"  . Fibromyalgia   . GERD (gastroesophageal reflux disease)   . High cholesterol    hx  . History of hiatal hernia   . Hypertension   . Obesity   . Occipital headache    "qd" (01/10/2017)  . Orthostatic hypotension 02/02/2015  . Plantar fasciitis   . Seasonal asthma   . Shingles   . Stomach ulcer      "I take RX for it" (01/10/2017)  . Stroke [facility]) 1997; 08/2016  . Type II diabetes mellitus (HCC)   . Vertebral artery dissection (HCC)    right     PAST SURGICAL HISTORY: Past Surgical History:  Procedure Laterality Date  . ANKLE CLOSED REDUCTION Right 01/07/2017   Procedure: CLOSED REDUCTION ANKLE;  Surgeon: Samson Frederic, MD;  [facility];  Service: Orthopedics;  Laterality: Right;  .  APPENDECTOMY    . CATARACT EXTRACTION W/ INTRAOCULAR LENS  IMPLANT, BILATERAL  03/2014 - 04/2014   left - right   . CLOSED REDUCTION NASAL FRACTURE  1990"s  . EXTERNAL FIXATION LEG Right 01/07/2017   Procedure: EXTERNAL FIXATION LEG;  Surgeon: Samson Frederic, MD;  Location: [facility] OR;  Service: Orthopedics;  Laterality: Right;  . FRACTURE SURGERY    . JOINT REPLACEMENT    . LAPAROSCOPIC CHOLECYSTECTOMY    . OPEN REDUCTION INTERNAL FIXATION (ORIF) TIBIA/FIBULA FRACTURE Right 01/20/2017   Procedure: OPEN REDUCTION INTERNAL FIXATION RIGHT ANKLE;  Surgeon: Samson Frederic, MD;  [facility]: [facility];  Service: Orthopedics;  Laterality: Right;  . SALPINGOOPHORECTOMY  1978  . SYNDESMOSIS REPAIR Right 03/13/2017   Procedure: HARDWARE REMOVAL OF RIGHT ABKLE, OPEN REDUCTION INTERNAL FIXATION OF SYNDESMOTIC INJURY;  Surgeon: Samson Frederic, MD;  Location: [facility] ORS;  Service: Orthopedics;  Laterality: Right;  Popliteal block  . TONSILLECTOMY    . TOTAL KNEE ARTHROPLASTY Bilateral   . TUBAL LIGATION  1978  . VAGINAL HYSTERECTOMY  1979   "partial"    FAMILY HISTORY: Family History  Problem Relation Age of Onset  . Alzheimer's disease Mother   . Congestive Heart Failure Father   . Diabetes Sister   . Hypertension Brother   . Diabetes Brother   . Diabetes Brother     SOCIAL HISTORY: Social History   Social History  . Marital status: Widowed    Spouse name: Trey Paula  . Number of children: 4  . Years of education: 13+   Occupational History  . Retired     Social History Main Topics  . Smoking status: Former  Smoker    Packs/day: 0.50    Years: 11.00    Types: Cigarettes    Quit date: 20  . Smokeless tobacco: Never Used  . Alcohol use No  . Drug use: No  . Sexual activity: No   Other Topics Concern  . Not on file   Social History Narrative   Patient lives at home with her husband.    Patient is a retired Architectural technologist.    Patient has a high school education and some college.    Patient has four children.   Patient is right handed.   Patient drinks about 2 cups caffeine daily.      PHYSICAL EXAM  Vitals:   08/19/17 1047  BP: 103/66  Pulse: 62  Weight: 253 lb 12.8 oz (115.1 kg)  Height: 5\' 3"  (1.6 m)   Body mass index is 44.96 kg/m.  Generalized: Well developed, in no acute distress   Neurological examination  Mentation: Alert oriented to time, place, history taking. Follows all commands speech and language fluent Cranial nerve II-XII: Pupils were equal round reactive to light. Extraocular movements were full, visual field were full on confrontational test. Facial sensation and strength were normal. Uvula tongue midline. Head turning and shoulder shrug  were normal and symmetric. Motor: The motor testing reveals 5 over 5 strength of all 4 extremities. Good symmetric motor tone is noted throughout.  Sensory: Sensory testing is intact to soft touch on all 4 extremities. No evidence of extinction is noted.  Coordination: Cerebellar testing reveals good finger-nose-finger and heel-to-shin bilaterally.  Gait and station: Patient uses the wall to steady her balance. Tandem gait was not attempted. Romberg is unsteady but negative. Reflexes: Deep tendon reflexes are symmetric and normal bilaterally.   DIAGNOSTIC DATA (LABS, IMAGING, TESTING) - I reviewed patient records, labs, notes, testing and imaging  myself where available.  Lab Results  Component Value Date   WBC 4.8 03/13/2017   HGB 12.6 03/13/2017   HCT 38.6 03/13/2017   MCV 90.6 03/13/2017   PLT 172 03/13/2017        Component Value Date/Time   NA 143 03/15/2017 0422   K 3.8 03/15/2017 0422   CL 104 03/15/2017 0422   CO2 33 (H) 03/15/2017 0422   GLUCOSE 108 (H) 03/15/2017 0422   BUN 15 03/15/2017 0422   CREATININE 1.31 (H) 03/15/2017 0422   CALCIUM 9.0 03/15/2017 0422   PROT 6.9 06/04/2007 1631   ALBUMIN 4.0 06/04/2007 1631   AST 31 06/04/2007 1631   ALT 30 06/04/2007 1631   ALKPHOS 94 06/04/2007 1631   BILITOT 0.8 06/04/2007 1631   GFRNONAA 41 (L) 03/15/2017 0422   GFRAA 47 (L) 03/15/2017 0422     ASSESSMENT AND PLAN 71 y.o. year old female  has a past medical history of Cervical spondylosis without myelopathy (08/10/2015); Chronic kidney disease (CKD), stage III (moderate); Chronic low back pain (02/19/2017); Chronic lower back pain; Dissection of cerebral artery (HCC) (1997); Endometriosis; Family history of adverse reaction to anesthesia; Fibromyalgia; GERD (gastroesophageal reflux disease); High cholesterol; History of hiatal hernia; Hypertension; Obesity; Occipital headache; Orthostatic hypotension (02/02/2015); Plantar fasciitis; Seasonal asthma; Shingles; Stomach ulcer; Stroke [facility]) (1997; 08/2016); Type II diabetes mellitus (HCC); and Vertebral artery dissection (HCC). here with:  1. Chronic low back pain 2. Abnormality of gait and balance  The patient will continue on gabapentin and Cymbalta. We discussed potentially adding on additional medication or increasing her medication to help control her back pain however the patient deferred for now. I recommended that she use hot and cold therapy on the low back if she is in discomfort. In the future we can do another round of physical therapy to help with her gait and balance. She voices understanding. She was advised that if her symptoms do not improve she should let us know. She will follow-up in 6 months or sooner if needed.    Butch Penny, MSN, NP-C 08/19/2017, 11:13 AM [facility] [address] [place], [place] [address] [phone]

## 2017-08-19 NOTE — Patient Instructions (Signed)
Your Plan:  Continue Gabapentin and Cymbalta Can try hot/cold therapy for the back We can consider Physical therapy in the future If your symptoms worsen or you develop new symptoms please let us know.   Thank you for coming to see [place] at [facility]. I hope we have been able to provide you high quality care today.  You may receive a patient satisfaction survey over the next few weeks. We would appreciate your feedback and comments so that we may continue to improve ourselves and the health of our patients.

## 2017-08-26 NOTE — Progress Notes (Signed)
I reviewed note and agree with plan.   Angelyna Henderson R. Gayle Martinez, MD 08/26/2017, 5:34 PM Certified in Neurology, Neurophysiology and Neuroimaging  [facility] [address], [address] [place],  [address] [phone]  

## 2017-10-06 ENCOUNTER — Telehealth: Payer: Self-pay

## 2017-10-06 MED ORDER — ZOLPIDEM TARTRATE 10 MG PO TABS: 10.0000 mg | ORAL_TABLET | Freq: Every evening | ORAL | 3 refills | Status: DC | PRN

## 2017-10-06 NOTE — Telephone Encounter (Signed)
The patient is not responding to the Belsomra, could potentially go up on the dose, I will call the patient.  I called patient. The 10 mg Belsomra does not work, I recommend going to 20 mg, she finds that the medication is too expensive for her, because $30 a month.  I will order Ambien again, it was denied previously. This prescription only costs $10 a month for her.

## 2017-10-06 NOTE — Addendum Note (Signed)
Addended by: York Spaniel on: 10/06/2017 05:01 PM   Modules accepted: Orders

## 2017-10-06 NOTE — Telephone Encounter (Signed)
A representative from [facility] called on behalf of the patient. Patient would like for [place] to call in her Rx for Ambien and appeal its denial, see 06/2017 telephone encounters. I made her aware to let the patient know that I would send a message back to see if Dr. Anne Hahn would approve this, but with Ambien being a high risk med, I can't guarantee. I asked if she knew if patient had tried the Belsomra, but she only said that patient mentioned that whatever she has isn't helping.

## 2017-10-07 NOTE — Telephone Encounter (Signed)
Faxed printed/signed rx ambien to [place], [place] at [phone]. Received fax confirmation.

## 2018-02-04 ENCOUNTER — Other Ambulatory Visit: Payer: Self-pay | Admitting: Neurology

## 2018-02-20 ENCOUNTER — Encounter (INDEPENDENT_AMBULATORY_CARE_PROVIDER_SITE_OTHER): Payer: Self-pay

## 2018-02-20 ENCOUNTER — Ambulatory Visit
Admission: RE | Admit: 2018-02-20 | Discharge: 2018-02-20 | Disposition: A | Discharge status: Home / Self Care | Payer: Medicare Other | Source: Ambulatory Visit | Attending: Neurology | Admitting: Neurology

## 2018-02-20 ENCOUNTER — Ambulatory Visit: Payer: Medicare Other | Admitting: Neurology

## 2018-02-20 ENCOUNTER — Telehealth: Payer: Self-pay | Admitting: Neurology

## 2018-02-20 ENCOUNTER — Encounter: Payer: Self-pay | Admitting: Neurology

## 2018-02-20 VITALS — BP 118/68 | HR 55 | Ht 63.0 in | Wt 263.0 lb

## 2018-02-20 DIAGNOSIS — G8929 Other chronic pain: Secondary | ICD-10-CM

## 2018-02-20 DIAGNOSIS — Z89621 Acquired absence of right hip joint: Secondary | ICD-10-CM

## 2018-02-20 DIAGNOSIS — M545 Low back pain: Secondary | ICD-10-CM

## 2018-02-20 DIAGNOSIS — M797 Fibromyalgia: Secondary | ICD-10-CM

## 2018-02-20 MED ORDER — ZOLPIDEM TARTRATE 10 MG PO TABS: 10.0000 mg | ORAL_TABLET | Freq: Every evening | ORAL | 4 refills | Status: DC | PRN

## 2018-02-20 MED ORDER — GABAPENTIN 400 MG PO CAPS: ORAL_CAPSULE | ORAL | 3 refills | Status: DC

## 2018-02-20 NOTE — Telephone Encounter (Signed)
I called the patient.  The patient does not appear to have significant degenerative changes in the right hip.  This could be a bursitis issue or a capsular problem.  If the problem continues, seeing an orthopedic surgeon may still be important.  XR right hip 02/20/18:   IMPRESSION: No fracture or dislocation. No evident arthropathy.

## 2018-02-20 NOTE — Progress Notes (Signed)
Reason for visit: Fibromyalgia, low back pain  Chloe [name] is an 72 y.o. female  History of present illness:  Ms. Chloe [name] is a 72 year old right-handed white female with a history of obesity, she has chronic low back pain and diffuse neuromuscular discomfort secondary to fibromyalgia.  The patient is relatively inactive, she is on gabapentin and she takes Cymbalta.  She has a history of diabetes and chronic renal insufficiency.  She takes Ambien at night for sleep.  She has not had any falls since last seen, she uses a cane for ambulation.  She has been complaining of significant problems with right hip discomfort, she cannot roll over on her side because of this and she has pain in the right hip with walking.  She returns to this office for an evaluation.  Past Medical History:  Diagnosis Date  . Cervical spondylosis without myelopathy 08/10/2015  . Chronic kidney disease (CKD), stage III (moderate) (HCC)   . Chronic low back pain 02/19/2017  . Chronic lower back pain   . Dissection of cerebral artery (HCC) 1997   "no OR"  . Endometriosis   . Family history of adverse reaction to anesthesia    "daughter has PONV"  . Fibromyalgia   . GERD (gastroesophageal reflux disease)   . High cholesterol    hx  . History of hiatal hernia   . Hypertension   . Obesity   . Occipital headache    "qd" (01/10/2017)  . Orthostatic hypotension 02/02/2015  . Plantar fasciitis   . Seasonal asthma   . Shingles   . Stomach ulcer    "I take RX for it" (01/10/2017)  . Stroke [facility](HCC) 1997; 08/2016  . Type II diabetes mellitus (HCC)   . Vertebral artery dissection [facility](HCC)    right     Past Surgical History:  Procedure Laterality Date  . ANKLE CLOSED REDUCTION Right 01/07/2017   Procedure: CLOSED REDUCTION ANKLE;  Surgeon: Samson FredericBrian Swinteck, MD;  Location: [facility] OR;  Service: Orthopedics;  Laterality: Right;  . APPENDECTOMY    . CATARACT EXTRACTION W/ INTRAOCULAR LENS  IMPLANT, BILATERAL  03/2014 - 04/2014   left - right   . CLOSED REDUCTION NASAL FRACTURE  1990"s  . EXTERNAL FIXATION LEG Right 01/07/2017   Procedure: EXTERNAL FIXATION LEG;  Surgeon: Samson FredericBrian Swinteck, MD;  Location: [facility] OR;  Service: Orthopedics;  Laterality: Right;  . FRACTURE SURGERY    . JOINT REPLACEMENT    . LAPAROSCOPIC CHOLECYSTECTOMY    . OPEN REDUCTION INTERNAL FIXATION (ORIF) TIBIA/FIBULA FRACTURE Right 01/20/2017   Procedure: OPEN REDUCTION INTERNAL FIXATION RIGHT ANKLE;  Surgeon: Samson FredericBrian Swinteck, MD;  Location: [facility] OR;  Service: Orthopedics;  Laterality: Right;  . SALPINGOOPHORECTOMY  1978  . SYNDESMOSIS REPAIR Right 03/13/2017   Procedure: HARDWARE REMOVAL OF RIGHT ABKLE, OPEN REDUCTION INTERNAL FIXATION OF SYNDESMOTIC INJURY;  Surgeon: Samson FredericBrian Swinteck, MD;  Location: [facility];  Service: Orthopedics;  Laterality: Right;  Popliteal block  . TONSILLECTOMY    . TOTAL KNEE ARTHROPLASTY Bilateral   . TUBAL LIGATION  1978  . VAGINAL HYSTERECTOMY  1979   "partial"    Family History  Problem Relation Age of Onset  . Alzheimer's disease Mother   . Congestive Heart Failure Father   . Diabetes Sister   . Hypertension Brother   . Diabetes Brother   . Diabetes Brother     Social history:  reports that she quit smoking about 34 years ago. Her smoking use included cigarettes. She has a  5.50 pack-year smoking history. she has never used smokeless tobacco. She reports that she does not drink alcohol or use drugs.    Allergies  Allergen Reactions  . Adhesive [Tape] Hives and Rash  . Codeine Nausea Only    Medications:  Prior to Admission medications   Medication Sig Start Date End Date Taking? Authorizing Provider  alendronate (FOSAMAX) 70 MG tablet Take 70 mg by mouth once a week. Take with a full glass of water on an empty stomach.   Yes [provider]  aspirin 81 MG tablet Take 81 mg by mouth daily.   Yes [provider]  Biotin 3 MG TABS Take 300 mcg by mouth daily.    Yes [provider]    CALCIUM PO Take 1,000 mg by mouth daily.    Yes [provider]  Cinnamon 500 MG TABS Take 1,000 mg by mouth 2 (two) times daily.    Yes [provider]  dicyclomine (BENTYL) 20 MG tablet Take 20 mg by mouth 3 (three) times daily.    Yes [provider]  docusate sodium (COLACE) 100 MG capsule Take 1 capsule (100 mg total) by mouth 2 (two) times daily. 01/08/17  Yes Swinteck, Arlys John, MD  DULoxetine (CYMBALTA) 60 MG capsule TAKE 1 CAPSULE BY MOUTH TWICE DAILY 02/04/18  Yes York Spaniel, MD  furosemide (LASIX) 40 MG tablet Take 80mg s every morning and 40mg s at 4pm   Yes [provider]  gabapentin (NEURONTIN) 400 MG capsule Take 2 caps in the morning, supper time and  Night . Take 3 caps at noon. 02/20/18  Yes York Spaniel, MD  HYDROcodone-acetaminophen (NORCO/VICODIN) 5-325 MG tablet Take 1-2 tablets by mouth every 4 (four) hours as needed (breakthrough pain). 01/08/17  Yes Swinteck, Arlys John, MD  insulin glargine (LANTUS) 100 UNIT/ML injection Inject into the skin. 40 units in the am   Yes [provider]  Insulin Lispro, Human, (HUMALOG [place]) Inject into the skin. 70-150 inject 3 units, 151-200 = 5 units, 201-250 = 7 units, 151-300 = 9 units, 301-350 = 11 units, 351-400 = 13 units, >401 = 16 units and notify Dr Patient does humalog with meals only   Yes [provider]  Methylcellulose, Laxative, (FIBER THERAPY) 500 MG TABS Take 1,000 mg by mouth daily.   Yes [provider]  metoprolol succinate (TOPROL XL) 25 MG 24 hr tablet Take 25 mg by mouth 2 (two) times daily.   Yes [provider]  omeprazole (PRILOSEC) 20 MG capsule Take 20 mg by mouth 2 (two) times daily.    Yes [provider]  Psyllium (METAMUCIL PO) Take 2 capsules by mouth daily.   Yes [provider]  ramipril (ALTACE) 10 MG capsule Take 10 mg by mouth daily.    Yes [provider]  senna (SENOKOT) 8.6 MG TABS tablet Take 1 tablet (8.6  mg total) by mouth 2 (two) times daily. Patient taking differently: Take 1 tablet by mouth at bedtime.  01/08/17  Yes Swinteck, Arlys John, MD  Turmeric 500 MG TABS Take 1,000 mg by mouth daily.   Yes [provider]  Vitamin D, Cholecalciferol, 400 units TABS Take 800 Units by mouth daily.   Yes [provider]  vitamin E 400 UNIT capsule Take 400 Units by mouth daily.   Yes [provider]  zolpidem (AMBIEN) 10 MG tablet Take 1 tablet (10 mg total) by mouth at bedtime as needed for sleep. 02/20/18 03/22/18  Stephanie Acre  K, MD    ROS:  Out of a complete 14 system review of symptoms, the patient complains only of the following symptoms, and all other reviewed systems are negative.  Back pain Walking difficulty  Blood pressure 118/68, pulse (!) 55, height 5\' 3"  (1.6 m), weight 263 lb (119.3 kg).  Physical Exam  General: The patient is alert and cooperative at the time of the examination.  The patient is markedly obese.  Neuromuscular: The patient has significant discomfort in the right hip with internal and external rotation.  Skin: No significant peripheral edema is noted.   Neurologic Exam  Mental status: The patient is alert and oriented x 3 at the time of the examination. The patient has apparent normal recent and remote memory, with an apparently normal attention span and concentration ability.   Cranial nerves: Facial symmetry is present. Speech is normal, no aphasia or dysarthria is noted. Extraocular movements are full. Visual fields are full.  Motor: The patient has good strength in all 4 extremities.  Sensory examination: Soft touch sensation is symmetric on the face, arms, and legs.  Coordination: The patient has good finger-nose-finger and heel-to-shin bilaterally.  Gait and station: The patient has a slightly wide based gait.  The patient normally walks with a cane.  Romberg is negative. No drift is seen.  Reflexes: Deep tendon reflexes are  symmetric.   Assessment/Plan:  1.  Obesity  2.  Fibromyalgia  3.  Chronic low back pain  4.  Right hip pain  The patient has been encouraged to increase her level of activity, she needs to engage in a nonweightbearing exercise such as water aerobics.  The patient will continue the Cymbalta and gabapentin, a prescription for the gabapentin and for the Ambien was sent in.  The patient is having some right hip discomfort, we will get an x-ray today.  The patient does have an orthopedic surgeon.  She will follow-up in 6 months, sooner if needed.  Marlan Palau MD 02/20/2018 10:26 AM  [facility] [address] [place], [place] [address]  Phone [phone] Fax [phone]

## 2018-02-20 NOTE — Patient Instructions (Signed)
   We will get Xr of the right hip

## 2018-08-26 ENCOUNTER — Ambulatory Visit: Payer: Medicare Other | Admitting: Adult Health

## 2018-08-28 ENCOUNTER — Other Ambulatory Visit (HOSPITAL_COMMUNITY): Payer: Self-pay | Admitting: Orthopedic Surgery

## 2018-09-08 DIAGNOSIS — I119 Hypertensive heart disease without heart failure: Secondary | ICD-10-CM

## 2018-09-08 DIAGNOSIS — E782 Mixed hyperlipidemia: Secondary | ICD-10-CM

## 2018-09-08 DIAGNOSIS — K219 Gastro-esophageal reflux disease without esophagitis: Secondary | ICD-10-CM

## 2018-09-08 DIAGNOSIS — N189 Chronic kidney disease, unspecified: Secondary | ICD-10-CM

## 2018-09-08 DIAGNOSIS — E1142 Type 2 diabetes mellitus with diabetic polyneuropathy: Secondary | ICD-10-CM

## 2018-09-08 DIAGNOSIS — M1990 Unspecified osteoarthritis, unspecified site: Secondary | ICD-10-CM

## 2018-09-08 HISTORY — DX: Hypertensive heart disease without heart failure: I11.9

## 2018-09-08 HISTORY — DX: Chronic kidney disease, unspecified: N18.9

## 2018-09-08 HISTORY — DX: Unspecified osteoarthritis, unspecified site: M19.90

## 2018-09-08 HISTORY — DX: Gastro-esophageal reflux disease without esophagitis: K21.9

## 2018-09-08 HISTORY — DX: Type 2 diabetes mellitus with diabetic polyneuropathy: E11.42

## 2018-09-08 HISTORY — DX: Morbid (severe) obesity due to excess calories: E66.01

## 2018-09-08 HISTORY — DX: Mixed hyperlipidemia: E78.2

## 2018-09-10 ENCOUNTER — Other Ambulatory Visit: Payer: Self-pay

## 2018-09-10 ENCOUNTER — Encounter (HOSPITAL_BASED_OUTPATIENT_CLINIC_OR_DEPARTMENT_OTHER): Payer: Self-pay | Admitting: *Deleted

## 2018-09-10 NOTE — Progress Notes (Addendum)
Cardiology Office Note:    Date:  09/11/2018   ID:  Chloe [name], DOB 1946-09-15, MRN [id number]  PCP:  [facility]  Cardiologist:  Norman Herrlich, MD   Referring MD: Buckner Malta, MD  ASSESSMENT:    1. Preoperative cardiovascular examination   2. RBBB   3. Hypertensive heart disease without heart failure   4. Mixed hyperlipidemia    PLAN:     Echo with normal left ventricular function no severe valvular abnormality proceed with her planned surgery In order of problems listed above:  1. Procedure is elective not high risk but with a new right bundle branch block and markedly reduced functional capacity less than 4 METS she will undergo echocardiogram prior to her surgery scheduled 09/14/2018 with an addendum to this note and unless she has reduced ejection fraction is low risk for the planned procedure 2. New finding typically on associated with cardiomyopathy however in this context echocardiogram is appropriate and ordered 3. Stable continue beta-blocker 4. Stable continue her statin  Next appointment as needed   Medication Adjustments/Labs and Tests Ordered: Current medicines are reviewed at length with the patient today.  Concerns regarding medicines are outlined above.  Orders Placed This Encounter  Procedures  . ECHOCARDIOGRAM COMPLETE   No orders of the defined types were placed in this encounter.    Chief Complaint  Patient presents with  . Abnormal ECG    History of Present Illness:    Chloe [name] is a 72 y.o. female who is being seen today for pre operative evaluation  at the request of Buckner Malta, MD. Medical history significant for hypertension with stage III CKD mixed dyslipidemia and chronic vertebral artery dissection followed by [facility] neurology echocardiogram performed 07/27/2016 showed normal left ventricular size wall thickness and systolic function EF of 55% She is scheduled for surgery 09/17/2018 for right ankle  removal of deep implants in the tibia and fibula arthroscopy and debridement with Dr. Toni Arthurs.  I had seen her prior to electronic record in 2015 unavailable and when she was in the hospital August 2017 with stroke.  Other problems include type 2 diabetes hypertension hyperlipidemia stage III kidney disease.  She is scheduled for orthopedic surgery had an episode of brief momentary nonanginal nonexertional chest pain she also was told she had a heart murmur physical examination her daughter works in [employer] was concerned and schedule evaluation in my office.  She is limited in her ambulation because of the joint pain but has had no shortness of breath palpitation or syncope. Past Medical History:  Diagnosis Date  . Ankle syndesmosis disruption, right, sequela 03/13/2017  . Cervical spondylosis without myelopathy 08/10/2015  . Chronic kidney disease (CKD) 09/08/2018  . Chronic kidney disease (CKD), stage III (moderate) (HCC)   . Chronic low back pain 02/19/2017  . Chronic lower back pain   . Closed fracture dislocation of right ankle 01/07/2017  . Diabetic peripheral neuropathy associated with type 2 diabetes mellitus (HCC) 09/08/2018  . Dissection of cerebral artery (HCC) 1997   "no OR"  . Endometriosis   . Family history of adverse reaction to anesthesia    "daughter has PONV"  . Fibromyalgia   . GERD (gastroesophageal reflux disease)   . GERD without esophagitis 09/08/2018  . Headache 08/16/2013  . High cholesterol    hx  . History of hiatal hernia   . Hypertension   . Hypertensive heart disease without heart failure 09/08/2018  . Mixed hyperlipidemia  09/08/2018  . Morbid obesity (HCC) 09/08/2018  . Myalgia and myositis 08/16/2013  . Obesity   . Occipital headache    "qd" (01/10/2017)  . Orthostatic hypotension 02/02/2015  . Osteoarthritis 09/08/2018  . Plantar fasciitis   . Seasonal asthma   . Shingles   . Stomach ulcer    "I take RX for it" (01/10/2017)  . Stroke [facility])  1997; 08/2016   mild right arm weakness  . Syncope and collapse 08/16/2013  . Syndesmotic disruption of ankle, right, sequela 03/13/2017  . Type II diabetes mellitus (HCC)   . Vertebral artery dissection [facility])    right     Past Surgical History:  Procedure Laterality Date  . ANKLE CLOSED REDUCTION Right 01/07/2017   Procedure: CLOSED REDUCTION ANKLE;  Surgeon: Samson Frederic, MD;  Location: [facility] OR;  Service: Orthopedics;  Laterality: Right;  . APPENDECTOMY    . CATARACT EXTRACTION W/ INTRAOCULAR LENS  IMPLANT, BILATERAL  03/2014 - 04/2014   left - right   . CLOSED REDUCTION NASAL FRACTURE  1990"s  . EXTERNAL FIXATION LEG Right 01/07/2017   Procedure: EXTERNAL FIXATION LEG;  Surgeon: Samson Frederic, MD;  Location: [facility] OR;  Service: Orthopedics;  Laterality: Right;  . FRACTURE SURGERY    . JOINT REPLACEMENT    . LAPAROSCOPIC CHOLECYSTECTOMY    . OPEN REDUCTION INTERNAL FIXATION (ORIF) TIBIA/FIBULA FRACTURE Right 01/20/2017   Procedure: OPEN REDUCTION INTERNAL FIXATION RIGHT ANKLE;  Surgeon: Samson Frederic, MD;  Location: [facility] OR;  Service: Orthopedics;  Laterality: Right;  . SALPINGOOPHORECTOMY  1978  . SYNDESMOSIS REPAIR Right 03/13/2017   Procedure: HARDWARE REMOVAL OF RIGHT ABKLE, OPEN REDUCTION INTERNAL FIXATION OF SYNDESMOTIC INJURY;  Surgeon: Samson Frederic, MD;  Location: [facility] ORS;  Service: Orthopedics;  Laterality: Right;  Popliteal block  . TONSILLECTOMY    . TOTAL KNEE ARTHROPLASTY Bilateral   . TUBAL LIGATION  1978  . VAGINAL HYSTERECTOMY  1979   "partial"    Current Medications: Current Meds  Medication Sig  . alendronate (FOSAMAX) 70 MG tablet Take 70 mg by mouth once a week. Take with a full glass of water on an empty stomach.  Marland Kitchen aspirin 81 MG tablet Take 81 mg by mouth daily.  . Biotin 3 MG TABS Take 300 mcg by mouth daily.   Marland Kitchen CALCIUM PO Take 1,000 mg by mouth daily.   . Cinnamon 500 MG TABS Take 1,000 mg by mouth 2 (two) times daily.   Marland Kitchen dicyclomine (BENTYL) 20 MG tablet  Take 20 mg by mouth 3 (three) times daily.   Marland Kitchen docusate sodium (COLACE) 100 MG capsule Take 1 capsule (100 mg total) by mouth 2 (two) times daily.  . DULoxetine (CYMBALTA) 60 MG capsule TAKE 1 CAPSULE BY MOUTH TWICE DAILY  . furosemide (LASIX) 40 MG tablet Take 80mg s every morning and 40mg s at 4pm  . gabapentin (NEURONTIN) 400 MG capsule Take 2 caps in the morning, supper time and  Night . Take 3 caps at noon.  . insulin glargine (LANTUS) 100 UNIT/ML injection Inject into the skin. 40 units in the am  . Insulin Lispro, Human, (HUMALOG Onset) Inject into the skin. 70-150 inject 3 units, 151-200 = 5 units, 201-250 = 7 units, 151-300 = 9 units, 301-350 = 11 units, 351-400 = 13 units, >401 = 16 units and notify Dr Patient does humalog with meals only  . Methylcellulose, Laxative, (FIBER THERAPY) 500 MG TABS Take 1,000 mg by mouth daily.  . metoprolol tartrate (LOPRESSOR) 25 MG tablet Take  25 mg by mouth 2 (two) times daily.  Marland Kitchen omeprazole (PRILOSEC) 20 MG capsule Take 20 mg by mouth 2 (two) times daily.   . Psyllium (METAMUCIL PO) Take 2 capsules by mouth daily.  . ramipril (ALTACE) 10 MG capsule Take 10 mg by mouth 2 (two) times daily.   . rosuvastatin (CRESTOR) 5 MG tablet Take 5 mg by mouth daily.  Marland Kitchen senna (SENOKOT) 8.6 MG TABS tablet Take 1 tablet (8.6 mg total) by mouth 2 (two) times daily. (Patient taking differently: Take 1 tablet by mouth at bedtime. )  . traMADol (ULTRAM) 50 MG tablet tramadol 50 mg tablet  . Turmeric 500 MG TABS Take 1,000 mg by mouth daily.  . Vitamin D, Cholecalciferol, 1000 units CAPS Take 1 Units by mouth 3 (three) times daily.   . vitamin E 400 UNIT capsule Take 400 Units by mouth daily.  Marland Kitchen zolpidem (AMBIEN) 10 MG tablet Take 1 tablet (10 mg total) by mouth at bedtime as needed for sleep.     Allergies:   Adhesive [tape]; Codeine; Fenofibrate; and Simvastatin   Social History   Socioeconomic History  . Marital status: Widowed    Spouse name: Trey Paula  . Number of  children: 4  . Years of education: 13+  . Highest education level: Not on file  Occupational History  . Occupation: Retired   Engineer, production  . Financial resource strain: Not on file  . Food insecurity:    Worry: Not on file    Inability: Not on file  . Transportation needs:    Medical: Not on file    Non-medical: Not on file  Tobacco Use  . Smoking status: Former Smoker    Packs/day: 0.50    Years: 11.00    Pack years: 5.50    Types: Cigarettes    Last attempt to quit: 1985    Years since quitting: 34.7  . Smokeless tobacco: Never Used  Substance and Sexual Activity  . Alcohol use: No  . Drug use: No  . Sexual activity: Never  Lifestyle  . Physical activity:    Days per week: Not on file    Minutes per session: Not on file  . Stress: Not on file  Relationships  . Social connections:    Talks on phone: Not on file    Gets together: Not on file    Attends religious service: Not on file    Active member of club or organization: Not on file    Attends meetings of clubs or organizations: Not on file    Relationship status: Not on file  Other Topics Concern  . Not on file  Social History Narrative   Patient lives at home with her husband.    Patient is a retired Architectural technologist.    Patient has a high school education and some college.    Patient has four children.   Patient is right handed.   Patient drinks about 2 cups caffeine daily.     Family History: The patient's family history includes Alzheimer's disease in her mother; CAD in her mother; Congestive Heart Failure in her father; Diabetes in her brother, brother, father, mother, and sister; Heart attack in her mother; Hypertension in her brother.  ROS:   Review of Systems  Constitution: Negative.  HENT: Negative.   Eyes: Negative.   Cardiovascular: Positive for leg swelling.  Respiratory: Negative.   Endocrine: Negative.   Hematologic/Lymphatic: Negative.   Skin: Negative.   Musculoskeletal: Positive  for joint  pain and joint swelling.  Gastrointestinal: Negative.   Genitourinary: Negative.   Neurological: Positive for dizziness.  Psychiatric/Behavioral: Negative.   Allergic/Immunologic: Negative.    Please see the history of present illness.     All other systems reviewed and are negative.  EKGs/Labs/Other Studies Reviewed:    The following studies were reviewed today: EKG 2017 reviewed sinus rhythm did not have bundle branch block EKG 09/01/2018 shows sinus rhythm right bundle branch block Recent Labs: Lipid profile 09/01/2018 shows cholesterol 161 HDL 42 LDL 72 triglycerides 235 free T4 was normal at that time No results found for requested labs within last 8760 hours.  Recent Lipid Panel No results found for: CHOL, TRIG, HDL, CHOLHDL, VLDL, LDLCALC, LDLDIRECT  Physical Exam:    VS:  BP 102/72 (BP Location: Left Arm, Patient Position: Sitting, Cuff Size: Normal)   Pulse (!) 59   Ht 5\' 3"  (1.6 m)   Wt 263 lb (119.3 kg)   SpO2 96%   BMI 46.59 kg/m     Wt Readings from Last 3 Encounters:  09/11/18 263 lb (119.3 kg)  02/20/18 263 lb (119.3 kg)  08/19/17 253 lb 12.8 oz (115.1 kg)     GEN:  Well nourished, well developed in no acute distress HEENT: Normal NECK: No JVD; No carotid bruits LYMPHATICS: No lymphadenopathy CARDIAC: RRR, no murmurs, rubs, gallops RESPIRATORY:  Clear to auscultation without rales, wheezing or rhonchi  ABDOMEN: Soft, non-tender, non-distended MUSCULOSKELETAL:  Non pitting ankle edema; No deformity  SKIN: Warm and dry NEUROLOGIC:  Alert and oriented x 3 PSYCHIATRIC:  Normal affect     Signed, Norman HerrlichBrian Myles Tavella, MD  09/11/2018 2:04 PM    [facility]

## 2018-09-11 ENCOUNTER — Ambulatory Visit: Payer: Medicare Other | Admitting: Cardiology

## 2018-09-11 ENCOUNTER — Telehealth: Payer: Self-pay | Admitting: Cardiology

## 2018-09-11 ENCOUNTER — Encounter: Payer: Self-pay | Admitting: Cardiology

## 2018-09-11 ENCOUNTER — Encounter (HOSPITAL_BASED_OUTPATIENT_CLINIC_OR_DEPARTMENT_OTHER)
Admission: RE | Admit: 2018-09-11 | Discharge: 2018-09-11 | Disposition: A | Discharge status: Home / Self Care | Payer: Medicare Other | Source: Ambulatory Visit | Attending: Orthopedic Surgery | Admitting: Orthopedic Surgery

## 2018-09-11 VITALS — BP 102/72 | HR 59 | Ht 63.0 in | Wt 263.0 lb

## 2018-09-11 DIAGNOSIS — I119 Hypertensive heart disease without heart failure: Secondary | ICD-10-CM

## 2018-09-11 DIAGNOSIS — E119 Type 2 diabetes mellitus without complications: Secondary | ICD-10-CM | POA: Diagnosis not present

## 2018-09-11 DIAGNOSIS — I4510 Unspecified right bundle-branch block: Secondary | ICD-10-CM

## 2018-09-11 DIAGNOSIS — Z01810 Encounter for preprocedural cardiovascular examination: Secondary | ICD-10-CM

## 2018-09-11 DIAGNOSIS — Z6841 Body Mass Index (BMI) 40.0 and over, adult: Secondary | ICD-10-CM | POA: Diagnosis not present

## 2018-09-11 DIAGNOSIS — E785 Hyperlipidemia, unspecified: Secondary | ICD-10-CM | POA: Diagnosis not present

## 2018-09-11 DIAGNOSIS — I081 Rheumatic disorders of both mitral and tricuspid valves: Secondary | ICD-10-CM | POA: Diagnosis not present

## 2018-09-11 DIAGNOSIS — E782 Mixed hyperlipidemia: Secondary | ICD-10-CM | POA: Diagnosis not present

## 2018-09-11 HISTORY — DX: Encounter for preprocedural cardiovascular examination: Z01.810

## 2018-09-11 LAB — BASIC METABOLIC PANEL WITH GFR
Anion gap: 10 (ref 5–15)
BUN: 19 mg/dL (ref 8–23)
CO2: 28 mmol/L (ref 22–32)
Calcium: 9.4 mg/dL (ref 8.9–10.3)
Chloride: 102 mmol/L (ref 98–111)
Creatinine, Ser: 1.6 mg/dL — ABNORMAL HIGH (ref 0.44–1.00)
GFR calc Af Amer: 37 mL/min — ABNORMAL LOW
GFR calc non Af Amer: 32 mL/min — ABNORMAL LOW
Glucose, Bld: 91 mg/dL (ref 70–99)
Potassium: 4.3 mmol/L (ref 3.5–5.1)
Sodium: 140 mmol/L (ref 135–145)

## 2018-09-11 NOTE — Patient Instructions (Signed)
Medication Instructions:  Your physician recommends that you continue on your current medications as directed. Please refer to the Current Medication list given to you today.   Labwork: None  Testing/Procedures: Your physician has requested that you have an echocardiogram. Echocardiography is a painless test that uses sound waves to create images of your heart. It provides your doctor with information about the size and shape of your heart and how well your heart's chambers and valves are working. This procedure takes approximately one hour. There are no restrictions for this procedure.  Follow-Up: Your physician recommends that you schedule a follow-up appointment as needed if symptoms worsen or fail to improve.   If you need a refill on your cardiac medications before your next appointment, please call your pharmacy.   Thank you for choosing [facility]! Mady GemmaCatherine Alec Mcphee, RN [phone]   Echocardiogram An echocardiogram, or echocardiography, uses sound waves (ultrasound) to produce an image of your heart. The echocardiogram is simple, painless, obtained within a short period of time, and offers valuable information to your health care provider. The images from an echocardiogram can provide information such as:  Evidence of coronary artery disease (CAD).  Heart size.  Heart muscle function.  Heart valve function.  Aneurysm detection.  Evidence of a past heart attack.  Fluid buildup around the heart.  Heart muscle thickening.  Assess heart valve function.  Tell a health care provider about:  Any allergies you have.  All medicines you are taking, including vitamins, herbs, eye drops, creams, and over-the-counter medicines.  Any problems you or family members have had with anesthetic medicines.  Any blood disorders you have.  Any surgeries you have had.  Any medical conditions you have.  Whether you are pregnant or may be pregnant. What happens before the  procedure? No special preparation is needed. Eat and drink normally. What happens during the procedure?  In order to produce an image of your heart, gel will be applied to your chest and a wand-like tool (transducer) will be moved over your chest. The gel will help transmit the sound waves from the transducer. The sound waves will harmlessly bounce off your heart to allow the heart images to be captured in real-time motion. These images will then be recorded.  You may need an IV to receive a medicine that improves the quality of the pictures. What happens after the procedure? You may return to your normal schedule including diet, activities, and medicines, unless your health care provider tells you otherwise. This information is not intended to replace advice given to you by your health care provider. Make sure you discuss any questions you have with your health care provider. Document Released: 12/06/2000 Document Revised: 07/27/2016 Document Reviewed: 08/16/2013 Elsevier Interactive Patient Education  2017 [employer]

## 2018-09-11 NOTE — Progress Notes (Signed)
Pt here for anesthesia consult. Weighed at 122.1kg with BMI 47.7. Notified Dr Chilton SiGreen who evaluated patient for surgery. Cleared by anesthesia pending echo results (to be done Monday) and cardiology clearance after echo. Pt's daughter, Celine MansSonja, given our fax number and she will facilitate getting results and clearance from cardiology to us. Pt and daughter verbalized understanding.

## 2018-09-11 NOTE — Telephone Encounter (Signed)
Patient having echo on Monday and the surgeon needs clearance letter faxed to [phone] attn: Kristi.

## 2018-09-14 ENCOUNTER — Ambulatory Visit (HOSPITAL_COMMUNITY): Payer: Medicare Other | Attending: Cardiovascular Disease

## 2018-09-14 DIAGNOSIS — Z01810 Encounter for preprocedural cardiovascular examination: Secondary | ICD-10-CM | POA: Diagnosis not present

## 2018-09-14 DIAGNOSIS — E119 Type 2 diabetes mellitus without complications: Secondary | ICD-10-CM | POA: Insufficient documentation

## 2018-09-14 DIAGNOSIS — I081 Rheumatic disorders of both mitral and tricuspid valves: Secondary | ICD-10-CM | POA: Insufficient documentation

## 2018-09-14 DIAGNOSIS — I119 Hypertensive heart disease without heart failure: Secondary | ICD-10-CM

## 2018-09-14 DIAGNOSIS — Z6841 Body Mass Index (BMI) 40.0 and over, adult: Secondary | ICD-10-CM | POA: Insufficient documentation

## 2018-09-14 DIAGNOSIS — E785 Hyperlipidemia, unspecified: Secondary | ICD-10-CM | POA: Insufficient documentation

## 2018-09-14 DIAGNOSIS — I4510 Unspecified right bundle-branch block: Secondary | ICD-10-CM | POA: Insufficient documentation

## 2018-09-15 NOTE — Telephone Encounter (Signed)
Echocardiogram results and office note from last visit with Dr. Dulce SellarMunley [place].

## 2018-09-15 NOTE — Progress Notes (Signed)
New ECHO study done 09-14-18 and cards office notes reviewed with Dr Desmond Lopeurk, [facility] Patient called and told surgery can proceed as scheduled.

## 2018-09-17 ENCOUNTER — Ambulatory Visit (HOSPITAL_BASED_OUTPATIENT_CLINIC_OR_DEPARTMENT_OTHER)
Admission: RE | Admit: 2018-09-17 | Discharge: 2018-09-17 | Disposition: A | Discharge status: Home / Self Care | Payer: Medicare Other | Source: Ambulatory Visit | Attending: Orthopedic Surgery | Admitting: Orthopedic Surgery

## 2018-09-17 ENCOUNTER — Ambulatory Visit (HOSPITAL_BASED_OUTPATIENT_CLINIC_OR_DEPARTMENT_OTHER): Payer: Medicare Other | Admitting: Anesthesiology

## 2018-09-17 ENCOUNTER — Other Ambulatory Visit: Payer: Self-pay

## 2018-09-17 ENCOUNTER — Encounter (HOSPITAL_BASED_OUTPATIENT_CLINIC_OR_DEPARTMENT_OTHER): Payer: Self-pay | Admitting: *Deleted

## 2018-09-17 ENCOUNTER — Encounter (HOSPITAL_BASED_OUTPATIENT_CLINIC_OR_DEPARTMENT_OTHER)
Admission: RE | Disposition: A | Discharge status: Home / Self Care | Payer: Self-pay | Source: Ambulatory Visit | Attending: Orthopedic Surgery

## 2018-09-17 DIAGNOSIS — N183 Chronic kidney disease, stage 3 (moderate): Secondary | ICD-10-CM | POA: Insufficient documentation

## 2018-09-17 DIAGNOSIS — K219 Gastro-esophageal reflux disease without esophagitis: Secondary | ICD-10-CM | POA: Diagnosis not present

## 2018-09-17 DIAGNOSIS — Y831 Surgical operation with implant of artificial internal device as the cause of abnormal reaction of the patient, or of later complication, without mention of misadventure at the time of the procedure: Secondary | ICD-10-CM | POA: Diagnosis not present

## 2018-09-17 DIAGNOSIS — Z7982 Long term (current) use of aspirin: Secondary | ICD-10-CM | POA: Diagnosis not present

## 2018-09-17 DIAGNOSIS — Z87891 Personal history of nicotine dependence: Secondary | ICD-10-CM | POA: Insufficient documentation

## 2018-09-17 DIAGNOSIS — E1151 Type 2 diabetes mellitus with diabetic peripheral angiopathy without gangrene: Secondary | ICD-10-CM | POA: Diagnosis not present

## 2018-09-17 DIAGNOSIS — M19071 Primary osteoarthritis, right ankle and foot: Secondary | ICD-10-CM | POA: Diagnosis not present

## 2018-09-17 DIAGNOSIS — E7800 Pure hypercholesterolemia, unspecified: Secondary | ICD-10-CM | POA: Diagnosis not present

## 2018-09-17 DIAGNOSIS — E1142 Type 2 diabetes mellitus with diabetic polyneuropathy: Secondary | ICD-10-CM | POA: Insufficient documentation

## 2018-09-17 DIAGNOSIS — Z79891 Long term (current) use of opiate analgesic: Secondary | ICD-10-CM | POA: Diagnosis not present

## 2018-09-17 DIAGNOSIS — Z794 Long term (current) use of insulin: Secondary | ICD-10-CM | POA: Diagnosis not present

## 2018-09-17 DIAGNOSIS — Z6841 Body Mass Index (BMI) 40.0 and over, adult: Secondary | ICD-10-CM | POA: Diagnosis not present

## 2018-09-17 DIAGNOSIS — M797 Fibromyalgia: Secondary | ICD-10-CM | POA: Insufficient documentation

## 2018-09-17 DIAGNOSIS — I129 Hypertensive chronic kidney disease with stage 1 through stage 4 chronic kidney disease, or unspecified chronic kidney disease: Secondary | ICD-10-CM | POA: Diagnosis not present

## 2018-09-17 DIAGNOSIS — E1122 Type 2 diabetes mellitus with diabetic chronic kidney disease: Secondary | ICD-10-CM | POA: Diagnosis not present

## 2018-09-17 DIAGNOSIS — M2408 Loose body, other site: Secondary | ICD-10-CM | POA: Insufficient documentation

## 2018-09-17 DIAGNOSIS — Z96653 Presence of artificial knee joint, bilateral: Secondary | ICD-10-CM | POA: Diagnosis not present

## 2018-09-17 DIAGNOSIS — M65871 Other synovitis and tenosynovitis, right ankle and foot: Secondary | ICD-10-CM | POA: Diagnosis not present

## 2018-09-17 DIAGNOSIS — Z79899 Other long term (current) drug therapy: Secondary | ICD-10-CM | POA: Diagnosis not present

## 2018-09-17 DIAGNOSIS — T8484XA Pain due to internal orthopedic prosthetic devices, implants and grafts, initial encounter: Secondary | ICD-10-CM | POA: Insufficient documentation

## 2018-09-17 DIAGNOSIS — I69351 Hemiplegia and hemiparesis following cerebral infarction affecting right dominant side: Secondary | ICD-10-CM | POA: Insufficient documentation

## 2018-09-17 HISTORY — PX: HARDWARE REMOVAL: SHX979

## 2018-09-17 HISTORY — PX: ANKLE ARTHROSCOPY: SHX545

## 2018-09-17 LAB — GLUCOSE, CAPILLARY
GLUCOSE-CAPILLARY: 95 mg/dL (ref 70–99)
Glucose-Capillary: 100 mg/dL — ABNORMAL HIGH (ref 70–99)

## 2018-09-17 SURGERY — REMOVAL, HARDWARE
Anesthesia: General | Site: Ankle | Laterality: Right

## 2018-09-17 MED ORDER — BUPIVACAINE LIPOSOME 1.3 % IJ SUSP
INTRAMUSCULAR | Status: DC | PRN
  Administered 2018-09-17: 20 mL

## 2018-09-17 MED ORDER — VANCOMYCIN HCL 1000 MG IV SOLR
INTRAVENOUS | Status: DC | PRN
  Administered 2018-09-17: 1000 mg via TOPICAL

## 2018-09-17 MED ORDER — CHLORHEXIDINE GLUCONATE 4 % EX LIQD: 60.0000 mL | Freq: Once | CUTANEOUS | Status: DC

## 2018-09-17 MED ORDER — FENTANYL CITRATE (PF) 100 MCG/2ML IJ SOLN
25.0000 ug | INTRAMUSCULAR | Status: DC | PRN
  Administered 2018-09-17: 25 ug via INTRAVENOUS
  Administered 2018-09-17: 50 ug via INTRAVENOUS

## 2018-09-17 MED ORDER — DEXAMETHASONE SODIUM PHOSPHATE 10 MG/ML IJ SOLN
INTRAMUSCULAR | Status: AC
  Filled 2018-09-17: qty 1

## 2018-09-17 MED ORDER — LACTATED RINGERS IV SOLN
INTRAVENOUS | Status: DC
  Administered 2018-09-17 (×2): via INTRAVENOUS

## 2018-09-17 MED ORDER — ONDANSETRON HCL 4 MG/2ML IJ SOLN: 4.0000 mg | Freq: Once | INTRAMUSCULAR | Status: DC | PRN

## 2018-09-17 MED ORDER — OXYCODONE HCL 5 MG PO TABS: 5.0000 mg | ORAL_TABLET | Freq: Four times a day (QID) | ORAL | 0 refills | Status: DC | PRN

## 2018-09-17 MED ORDER — FENTANYL CITRATE (PF) 100 MCG/2ML IJ SOLN
50.0000 ug | INTRAMUSCULAR | Status: AC | PRN
  Administered 2018-09-17 (×4): 25 ug via INTRAVENOUS

## 2018-09-17 MED ORDER — SODIUM CHLORIDE 0.9 % IV SOLN: INTRAVENOUS | Status: DC

## 2018-09-17 MED ORDER — CEFAZOLIN SODIUM-DEXTROSE 1-4 GM/50ML-% IV SOLN
INTRAVENOUS | Status: AC
  Filled 2018-09-17: qty 50

## 2018-09-17 MED ORDER — PROPOFOL 10 MG/ML IV BOLUS
INTRAVENOUS | Status: AC
  Filled 2018-09-17: qty 20

## 2018-09-17 MED ORDER — MIDAZOLAM HCL 2 MG/2ML IJ SOLN
INTRAMUSCULAR | Status: AC
  Filled 2018-09-17: qty 2

## 2018-09-17 MED ORDER — BUPIVACAINE-EPINEPHRINE (PF) 0.25% -1:200000 IJ SOLN
INTRAMUSCULAR | Status: AC
  Filled 2018-09-17: qty 30

## 2018-09-17 MED ORDER — EPHEDRINE SULFATE-NACL 50-0.9 MG/10ML-% IV SOSY
PREFILLED_SYRINGE | INTRAVENOUS | Status: DC | PRN
  Administered 2018-09-17: 10 mg via INTRAVENOUS

## 2018-09-17 MED ORDER — FENTANYL CITRATE (PF) 100 MCG/2ML IJ SOLN
INTRAMUSCULAR | Status: AC
  Filled 2018-09-17: qty 2

## 2018-09-17 MED ORDER — MIDAZOLAM HCL 2 MG/2ML IJ SOLN: 1.0000 mg | INTRAMUSCULAR | Status: DC | PRN

## 2018-09-17 MED ORDER — ONDANSETRON HCL 4 MG/2ML IJ SOLN
INTRAMUSCULAR | Status: DC | PRN
  Administered 2018-09-17: 4 mg via INTRAVENOUS

## 2018-09-17 MED ORDER — PROPOFOL 10 MG/ML IV BOLUS
INTRAVENOUS | Status: DC | PRN
  Administered 2018-09-17 (×2): 100 mg via INTRAVENOUS

## 2018-09-17 MED ORDER — LIDOCAINE HCL (CARDIAC) PF 100 MG/5ML IV SOSY
PREFILLED_SYRINGE | INTRAVENOUS | Status: DC | PRN
  Administered 2018-09-17: 50 mg via INTRAVENOUS

## 2018-09-17 MED ORDER — CEFAZOLIN SODIUM-DEXTROSE 2-4 GM/100ML-% IV SOLN
INTRAVENOUS | Status: AC
  Filled 2018-09-17: qty 100

## 2018-09-17 MED ORDER — LIDOCAINE 2% (20 MG/ML) 5 ML SYRINGE
INTRAMUSCULAR | Status: AC
  Filled 2018-09-17: qty 5

## 2018-09-17 MED ORDER — OXYCODONE HCL 5 MG PO TABS: 5.0000 mg | ORAL_TABLET | Freq: Once | ORAL | Status: DC | PRN

## 2018-09-17 MED ORDER — MEPERIDINE HCL 25 MG/ML IJ SOLN: 6.2500 mg | INTRAMUSCULAR | Status: DC | PRN

## 2018-09-17 MED ORDER — BUPIVACAINE LIPOSOME 1.3 % IJ SUSP
INTRAMUSCULAR | Status: AC
  Filled 2018-09-17: qty 20

## 2018-09-17 MED ORDER — VANCOMYCIN HCL 1000 MG IV SOLR
INTRAVENOUS | Status: AC
  Filled 2018-09-17: qty 1000

## 2018-09-17 MED ORDER — SCOPOLAMINE 1 MG/3DAYS TD PT72: 1.0000 | MEDICATED_PATCH | Freq: Once | TRANSDERMAL | Status: DC | PRN

## 2018-09-17 MED ORDER — SODIUM CHLORIDE 0.9 % IR SOLN
Status: DC | PRN
  Administered 2018-09-17: 1000 mL

## 2018-09-17 MED ORDER — CEFAZOLIN SODIUM-DEXTROSE 2-4 GM/100ML-% IV SOLN
2.0000 g | INTRAVENOUS | Status: AC
  Administered 2018-09-17: 3 g via INTRAVENOUS

## 2018-09-17 MED ORDER — ONDANSETRON HCL 4 MG/2ML IJ SOLN
INTRAMUSCULAR | Status: AC
  Filled 2018-09-17: qty 2

## 2018-09-17 MED ORDER — OXYCODONE HCL 5 MG/5ML PO SOLN: 5.0000 mg | Freq: Once | ORAL | Status: DC | PRN

## 2018-09-17 MED ORDER — DEXAMETHASONE SODIUM PHOSPHATE 4 MG/ML IJ SOLN
INTRAMUSCULAR | Status: DC | PRN
  Administered 2018-09-17: 10 mg via INTRAVENOUS

## 2018-09-17 MED ORDER — SODIUM CHLORIDE 0.9 % IJ SOLN
INTRAMUSCULAR | Status: AC
  Filled 2018-09-17: qty 20

## 2018-09-17 SURGICAL SUPPLY — 97 items
APL SKNCLS STERI-STRIP NONHPOA (GAUZE/BANDAGES/DRESSINGS)
BANDAGE ACE 4X5 VEL STRL LF (GAUZE/BANDAGES/DRESSINGS) IMPLANT
BANDAGE ESMARK 6X9 LF (GAUZE/BANDAGES/DRESSINGS) IMPLANT
BENZOIN TINCTURE PRP APPL 2/3 (GAUZE/BANDAGES/DRESSINGS) IMPLANT
BLADE CUDA 2.0 (BLADE) IMPLANT
BLADE CUDA GRT WHITE 3.5 (BLADE) IMPLANT
BLADE CUDA SHAVER 3.5 (BLADE) IMPLANT
BLADE CUTTER GATOR 3.5 (BLADE) IMPLANT
BLADE SURG 15 STRL LF DISP TIS (BLADE) ×4 IMPLANT
BLADE SURG 15 STRL SS (BLADE) ×8
BNDG CMPR 9X4 STRL LF SNTH (GAUZE/BANDAGES/DRESSINGS)
BNDG CMPR 9X6 STRL LF SNTH (GAUZE/BANDAGES/DRESSINGS)
BNDG COHESIVE 4X5 TAN STRL (GAUZE/BANDAGES/DRESSINGS) IMPLANT
BNDG COHESIVE 6X5 TAN STRL LF (GAUZE/BANDAGES/DRESSINGS) IMPLANT
BNDG ESMARK 4X9 LF (GAUZE/BANDAGES/DRESSINGS) IMPLANT
BNDG ESMARK 6X9 LF (GAUZE/BANDAGES/DRESSINGS)
BNDG GAUZE ELAST 4 BULKY (GAUZE/BANDAGES/DRESSINGS) ×4 IMPLANT
BOOT STEPPER DURA LG (SOFTGOODS) IMPLANT
BOOT STEPPER DURA MED (SOFTGOODS) ×3 IMPLANT
BOOT STEPPER DURA SM (SOFTGOODS) IMPLANT
BUR 3.5 LG SPHERICAL (BURR) IMPLANT
BUR CUDA 2.9 (BURR) IMPLANT
BUR CUDA 2.9MM (BURR)
BUR FULL RADIUS 2.9 (BURR) IMPLANT
BUR FULL RADIUS 2.9MM (BURR)
BUR GATOR 2.9 (BURR) IMPLANT
BUR GATOR 2.9MM (BURR)
BUR OVAL 4.0 (BURR) IMPLANT
BUR SPHERICAL 2.9 (BURR) IMPLANT
BUR SPHERICAL 2.9MM (BURR)
BUR VERTEX HOODED 4.5 (BURR) IMPLANT
BURR 3.5 LG SPHERICAL (BURR)
BURR 3.5MM LG SPHERICAL (BURR)
CHLORAPREP W/TINT 26ML (MISCELLANEOUS) ×4 IMPLANT
CLOSURE WOUND 1/2 X4 (GAUZE/BANDAGES/DRESSINGS)
COVER BACK TABLE 60X90IN (DRAPES) ×4 IMPLANT
CUFF TOURNIQUET SINGLE 34IN LL (TOURNIQUET CUFF) IMPLANT
CUFF TOURNIQUET SINGLE 44IN (TOURNIQUET CUFF) ×3 IMPLANT
DECANTER SPIKE VIAL GLASS SM (MISCELLANEOUS) IMPLANT
DRAPE EXTREMITY T 121X128X90 (DRAPE) ×4 IMPLANT
DRAPE OEC MINIVIEW 54X84 (DRAPES) IMPLANT
DRAPE SURG 17X23 STRL (DRAPES) ×3 IMPLANT
DRAPE U-SHAPE 47X51 STRL (DRAPES) ×1 IMPLANT
DRSG MEPITEL 4X7.2 (GAUZE/BANDAGES/DRESSINGS) ×4 IMPLANT
DRSG PAD ABDOMINAL 8X10 ST (GAUZE/BANDAGES/DRESSINGS) ×3 IMPLANT
ELECT REM PT RETURN 9FT ADLT (ELECTROSURGICAL) ×4
ELECTRODE REM PT RTRN 9FT ADLT (ELECTROSURGICAL) ×2 IMPLANT
GAUZE SPONGE 4X4 12PLY STRL (GAUZE/BANDAGES/DRESSINGS) ×4 IMPLANT
GLOVE BIO SURGEON STRL SZ8 (GLOVE) ×4 IMPLANT
GLOVE BIOGEL PI IND STRL 7.0 (GLOVE) ×2 IMPLANT
GLOVE BIOGEL PI IND STRL 8 (GLOVE) ×4 IMPLANT
GLOVE BIOGEL PI INDICATOR 7.0 (GLOVE) ×4
GLOVE BIOGEL PI INDICATOR 8 (GLOVE) ×4
GLOVE ECLIPSE 6.5 STRL STRAW (GLOVE) ×3 IMPLANT
GLOVE ECLIPSE 8.0 STRL XLNG CF (GLOVE) ×4 IMPLANT
GOWN STRL REUS W/ TWL LRG LVL3 (GOWN DISPOSABLE) ×2 IMPLANT
GOWN STRL REUS W/ TWL XL LVL3 (GOWN DISPOSABLE) ×4 IMPLANT
GOWN STRL REUS W/TWL LRG LVL3 (GOWN DISPOSABLE) ×4
GOWN STRL REUS W/TWL XL LVL3 (GOWN DISPOSABLE) ×8
MANIFOLD NEPTUNE II (INSTRUMENTS) ×4 IMPLANT
NEEDLE HYPO 22GX1.5 SAFETY (NEEDLE) IMPLANT
PACK ARTHROSCOPY DSU (CUSTOM PROCEDURE TRAY) ×4 IMPLANT
PACK BASIN DAY SURGERY FS (CUSTOM PROCEDURE TRAY) ×4 IMPLANT
PAD CAST 4YDX4 CTTN HI CHSV (CAST SUPPLIES) ×3 IMPLANT
PADDING CAST ABS 4INX4YD NS (CAST SUPPLIES)
PADDING CAST ABS COTTON 4X4 ST (CAST SUPPLIES) IMPLANT
PADDING CAST COTTON 4X4 STRL (CAST SUPPLIES) ×8
PADDING CAST COTTON 6X4 STRL (CAST SUPPLIES) IMPLANT
PENCIL BUTTON HOLSTER BLD 10FT (ELECTRODE) ×4 IMPLANT
ROUTER HOODED VORTEX 2.9MM (BLADE) IMPLANT
SANITIZER HAND PURELL 535ML FO (MISCELLANEOUS) ×1 IMPLANT
SHEET MEDIUM DRAPE 40X70 STRL (DRAPES) ×4 IMPLANT
SLEEVE SCD COMPRESS KNEE MED (MISCELLANEOUS) ×4 IMPLANT
SPLINT FAST PLASTER 5X30 (CAST SUPPLIES)
SPLINT PLASTER CAST FAST 5X30 (CAST SUPPLIES) IMPLANT
SPONGE LAP 18X18 RF (DISPOSABLE) ×4 IMPLANT
STAPLER VISISTAT 35W (STAPLE) ×3 IMPLANT
STOCKINETTE 6  STRL (DRAPES) ×2
STOCKINETTE 6 STRL (DRAPES) ×2 IMPLANT
STRAP ANKLE FOOT DISTRACTOR (ORTHOPEDIC SUPPLIES) ×3 IMPLANT
STRIP CLOSURE SKIN 1/2X4 (GAUZE/BANDAGES/DRESSINGS) IMPLANT
SUCTION FRAZIER HANDLE 10FR (MISCELLANEOUS) ×2
SUCTION TUBE FRAZIER 10FR DISP (MISCELLANEOUS) ×1 IMPLANT
SUT ETHILON 3 0 PS 1 (SUTURE) ×4 IMPLANT
SUT MNCRL AB 3-0 PS2 18 (SUTURE) ×4 IMPLANT
SUT VIC AB 0 SH 27 (SUTURE) IMPLANT
SUT VIC AB 2-0 SH 18 (SUTURE) IMPLANT
SUT VIC AB 2-0 SH 27 (SUTURE) ×4
SUT VIC AB 2-0 SH 27XBRD (SUTURE) ×1 IMPLANT
SYR BULB 3OZ (MISCELLANEOUS) ×4 IMPLANT
SYR CONTROL 10ML LL (SYRINGE) IMPLANT
TOWEL GREEN STERILE FF (TOWEL DISPOSABLE) ×4 IMPLANT
TUBE CONNECTING 20'X1/4 (TUBING)
TUBE CONNECTING 20X1/4 (TUBING) IMPLANT
TUBING ARTHRO INFLOW-ONLY STRL (TUBING) ×1 IMPLANT
UNDERPAD 30X30 (UNDERPADS AND DIAPERS) ×4 IMPLANT
WATER STERILE IRR 1000ML POUR (IV SOLUTION) ×1 IMPLANT

## 2018-09-17 NOTE — Anesthesia Procedure Notes (Addendum)
Procedure Name: LMA Insertion Date/Time: 09/17/2018 11:35 AM Performed by: Gar Gibbon, CRNA Pre-anesthesia Checklist: Patient identified, Emergency Drugs available, Suction available and Patient being monitored Patient Re-evaluated:Patient Re-evaluated prior to induction Oxygen Delivery Method: Circle system utilized Preoxygenation: Pre-oxygenation with 100% oxygen Induction Type: IV induction Ventilation: Mask ventilation without difficulty LMA: LMA inserted LMA Size: 4.0 Number of attempts: 1 Airway Equipment and Method: Bite block Placement Confirmation: positive ETCO2 Tube secured with: Tape Dental Injury: Teeth and Oropharynx as per pre-operative assessment

## 2018-09-17 NOTE — Discharge Instructions (Addendum)
Toni Arthurs, MD [facility]  Please read the following information regarding your care after surgery.  Medications  You only need a prescription for the narcotic pain medicine (ex. oxycodone, Percocet, Norco).  All of the other medicines listed below are available over the counter. X Aleve 2 pills twice a day for the first 3 days after surgery. X acetominophen (Tylenol) 650 mg every 4-6 hours as you need for minor to moderate pain X oxycodone as prescribed for severe pain  Narcotic pain medicine (ex. oxycodone, Percocet, Vicodin) will cause constipation.  To prevent this problem, take the following medicines while you are taking any pain medicine. X docusate sodium (Colace) 100 mg twice a day X senna (Senokot) 2 tablets twice a day  X To help prevent blood clots, take a baby aspirin (81 mg) twice a day for two weeks after surgery.  You should also get up every hour while you are awake to move around.    Weight Bearing X Bear weight when you are able on your operated leg or foot. ? Bear weight only on your operated foot in the post-op shoe. ? Do not bear any weight on the operated leg or foot.  Cast / Splint / Dressing X Keep your splint, cast or dressing clean and dry.  Dont put anything (coat hanger, pencil, etc) down inside of it.  If it gets damp, use a hair dryer on the cool setting to dry it.  If it gets soaked, call the office to schedule an appointment for a cast change. ? Remove your dressing 3 days after surgery and cover the incisions with dry dressings.    After your dressing, cast or splint is removed; you may shower, but do not soak or scrub the wound.  Allow the water to run over it, and then gently pat it dry.  Swelling It is normal for you to have swelling where you had surgery.  To reduce swelling and pain, keep your toes above your nose for at least 3 days after surgery.  It may be necessary to keep your foot or leg elevated for several weeks.  If it hurts, it should  be elevated.  Follow Up Call my office at [phone] when you are discharged from the hospital or surgery center to schedule an appointment to be seen two weeks after surgery.  Call my office at [phone] if you develop a fever >101.5 F, nausea, vomiting, bleeding from the surgical site or severe pain.     Post Anesthesia Home Care Instructions  Activity: Get plenty of rest for the remainder of the day. A responsible individual must stay with you for 24 hours following the procedure.  For the next 24 hours, DO NOT: -Drive a car -Advertising copywriter -Drink alcoholic beverages -Take any medication unless instructed by your physician -Make any legal decisions or sign important papers.  Meals: Start with liquid foods such as gelatin or soup. Progress to regular foods as tolerated. Avoid greasy, spicy, heavy foods. If nausea and/or vomiting occur, drink only clear liquids until the nausea and/or vomiting subsides. Call your physician if vomiting continues.  Special Instructions/Symptoms: Your throat may feel dry or sore from the anesthesia or the breathing tube placed in your throat during surgery. If this causes discomfort, gargle with warm salt water. The discomfort should disappear within 24 hours.  If you had a scopolamine patch placed behind your ear for the management of post- operative nausea and/or vomiting:  1. The medication in the patch  is effective for 72 hours, after which it should be removed.  Wrap patch in a tissue and discard in the trash. Wash hands thoroughly with soap and water. 2. You may remove the patch earlier than 72 hours if you experience unpleasant side effects which may include dry mouth, dizziness or visual disturbances. 3. Avoid touching the patch. Wash your hands with soap and water after contact with the patch.     Information for Discharge Teaching: EXPAREL (bupivacaine liposome injectable suspension)   Your surgeon or anesthesiologist gave you  EXPAREL(bupivacaine) to help control your pain after surgery.   EXPAREL is a local anesthetic that provides pain relief by numbing the tissue around the surgical site.  EXPAREL is designed to release pain medication over time and can control pain for up to 72 hours.  Depending on how you respond to EXPAREL, you may require less pain medication during your recovery.  Possible side effects:  Temporary loss of sensation or ability to move in the area where bupivacaine was injected.  Nausea, vomiting, constipation  Rarely, numbness and tingling in your mouth or lips, lightheadedness, or anxiety may occur.  Call your doctor right away if you think you may be experiencing any of these sensations, or if you have other questions regarding possible side effects.  Follow all other discharge instructions given to you by your surgeon or nurse. Eat a healthy diet and drink plenty of water or other fluids.  If you return to the hospital for any reason within 96 hours following the administration of EXPAREL, it is important for health care providers to know that you have received this anesthetic. A teal colored band has been placed on your arm with the date, time and amount of EXPAREL you have received in order to alert and inform your health care providers. Please leave this armband in place for the full 96 hours following administration, and then you may remove the band.

## 2018-09-17 NOTE — Anesthesia Postprocedure Evaluation (Signed)
Anesthesia Post Note  Patient: Chloe [name]  Procedure(s) Performed: Right ankle removal of deep implants tibia and fibula (Right Ankle) Right ankle arthroscopy with extensive debridement, removal of loose body (Right Ankle)     Patient location during evaluation: PACU Anesthesia Type: General Level of consciousness: sedated and patient cooperative Pain management: pain level controlled Vital Signs Assessment: post-procedure vital signs reviewed and stable Respiratory status: spontaneous breathing Cardiovascular status: stable Anesthetic complications: no    Last Vitals:  Vitals:   09/17/18 1400 09/17/18 1425  BP: 111/80 122/65  Pulse: 67 70  Resp: 17 20  Temp:  36.6 C  SpO2: 99% 100%    Last Pain:  Vitals:   09/17/18 1425  TempSrc:   PainSc: 0-No pain                 Lewie Loron

## 2018-09-17 NOTE — H&P (Signed)
Chloe [name] is an 72 y.o. female.   Chief Complaint: Right ankle pain  HPI: The patient is a 72 year old female with past medical history significant for a right ankle fracture several years ago.  She continues to have right ankle pain medially and laterally at her hardware.  She also has signs and symptoms of intra-articular synovitis.  She presents now for operative treatment having failed nonoperative treatment to date.  Past Medical History:  Diagnosis Date  . Ankle syndesmosis disruption, right, sequela 03/13/2017  . Cervical spondylosis without myelopathy 08/10/2015  . Chronic kidney disease (CKD) 09/08/2018  . Chronic kidney disease (CKD), stage III (moderate) (HCC)   . Chronic low back pain 02/19/2017  . Chronic lower back pain   . Closed fracture dislocation of right ankle 01/07/2017  . Diabetic peripheral neuropathy associated with type 2 diabetes mellitus (HCC) 09/08/2018  . Dissection of cerebral artery (HCC) 1997   "no OR"  . Endometriosis   . Family history of adverse reaction to anesthesia    "daughter has PONV"  . Fibromyalgia   . GERD (gastroesophageal reflux disease)   . GERD without esophagitis 09/08/2018  . Headache 08/16/2013  . High cholesterol    hx  . History of hiatal hernia   . Hypertension   . Hypertensive heart disease without heart failure 09/08/2018  . Mixed hyperlipidemia 09/08/2018  . Morbid obesity (HCC) 09/08/2018  . Myalgia and myositis 08/16/2013  . Obesity   . Occipital headache    "qd" (01/10/2017)  . Orthostatic hypotension 02/02/2015  . Osteoarthritis 09/08/2018  . Plantar fasciitis   . Seasonal asthma   . Shingles   . Stomach ulcer    "I take RX for it" (01/10/2017)  . Stroke [facility]) 1997; 08/2016   mild right arm weakness  . Syncope and collapse 08/16/2013  . Syndesmotic disruption of ankle, right, sequela 03/13/2017  . Type II diabetes mellitus (HCC)   . 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 ANKLE CLOSED REDUCTION Right 01/07/2017   Procedure: CLOSED REDUCTION ANKLE;  Surgeon: Samson Frederic, MD;  Location: [facility] OR;  Service: Orthopedics;  Laterality: Right;  . APPENDECTOMY    . CATARACT EXTRACTION W/ INTRAOCULAR LENS  IMPLANT, BILATERAL  03/2014 - 04/2014   left - right   . CLOSED REDUCTION NASAL FRACTURE  1990"s  . EXTERNAL FIXATION LEG Right 01/07/2017   Procedure: EXTERNAL FIXATION LEG;  Surgeon: Samson Frederic, MD;  Location: [facility] OR;  Service: Orthopedics;  Laterality: Right;  . FRACTURE SURGERY    . JOINT REPLACEMENT    . LAPAROSCOPIC CHOLECYSTECTOMY    . OPEN REDUCTION INTERNAL FIXATION (ORIF) TIBIA/FIBULA FRACTURE Right 01/20/2017   Procedure: OPEN REDUCTION INTERNAL FIXATION RIGHT ANKLE;  Surgeon: Samson Frederic, MD;  Location: [facility] OR;  Service: Orthopedics;  Laterality: Right;  . SALPINGOOPHORECTOMY  1978  . SYNDESMOSIS REPAIR Right 03/13/2017   Procedure: HARDWARE REMOVAL OF RIGHT ABKLE, OPEN REDUCTION INTERNAL FIXATION OF SYNDESMOTIC INJURY;  Surgeon: Samson Frederic, MD;  Location: [facility] ORS;  Service: Orthopedics;  Laterality: Right;  Popliteal block  . TONSILLECTOMY    . TOTAL KNEE ARTHROPLASTY Bilateral   . TUBAL LIGATION  1978  . VAGINAL HYSTERECTOMY  1979   "partial"    Family History  Problem Relation Age of Onset  . Alzheimer's disease Mother   . CAD Mother   . Diabetes Mother   . Heart attack Mother   .  Congestive Heart Failure Father   . Diabetes Father   . Diabetes Sister   . Hypertension Brother   . Diabetes Brother   . Diabetes Brother    Social History:  reports that she quit smoking about 34 years ago. Her smoking use included cigarettes. She has a 5.50 pack-year smoking history. She has never used smokeless tobacco. She reports that she does not drink alcohol or use drugs.  Allergies:  Allergies  Allergen Reactions  . Adhesive [Tape] Hives and Rash  . Codeine Nausea Only  . Fenofibrate Other (See Comments)    Hair loss  .  Simvastatin Rash    Medications Prior to Admission  Medication Sig Dispense Refill  . alendronate (FOSAMAX) 70 MG tablet Take 70 mg by mouth once a week. Take with a full glass of water on an empty stomach.    Marland Kitchen aspirin 81 MG tablet Take 81 mg by mouth daily.    . Biotin 3 MG TABS Take 300 mcg by mouth daily.     Marland Kitchen CALCIUM PO Take 1,000 mg by mouth daily.     . Cinnamon 500 MG TABS Take 1,000 mg by mouth 2 (two) times daily.     Marland Kitchen dicyclomine (BENTYL) 20 MG tablet Take 20 mg by mouth 3 (three) times daily.     Marland Kitchen docusate sodium (COLACE) 100 MG capsule Take 1 capsule (100 mg total) by mouth 2 (two) times daily. 10 capsule 0  . DULoxetine (CYMBALTA) 60 MG capsule TAKE 1 CAPSULE BY MOUTH TWICE DAILY 180 capsule 3  . furosemide (LASIX) 40 MG tablet Take 80mg s every morning and 40mg s at 4pm    . gabapentin (NEURONTIN) 400 MG capsule Take 2 caps in the morning, supper time and  Night . Take 3 caps at noon. 810 capsule 3  . insulin glargine (LANTUS) 100 UNIT/ML injection Inject into the skin. 40 units in the am    . Insulin Lispro, Human, (HUMALOG [place]) Inject into the skin. 70-150 inject 3 units, 151-200 = 5 units, 201-250 = 7 units, 151-300 = 9 units, 301-350 = 11 units, 351-400 = 13 units, >401 = 16 units and notify Dr Patient does humalog with meals only    . Methylcellulose, Laxative, (FIBER THERAPY) 500 MG TABS Take 1,000 mg by mouth daily.    . metoprolol tartrate (LOPRESSOR) 25 MG tablet Take 25 mg by mouth 2 (two) times daily.    Marland Kitchen omeprazole (PRILOSEC) 20 MG capsule Take 20 mg by mouth 2 (two) times daily.     . Psyllium (METAMUCIL PO) Take 2 capsules by mouth daily.    . ramipril (ALTACE) 10 MG capsule Take 10 mg by mouth 2 (two) times daily.     . rosuvastatin (CRESTOR) 5 MG tablet Take 5 mg by mouth daily.    Marland Kitchen senna (SENOKOT) 8.6 MG TABS tablet Take 1 tablet (8.6 mg total) by mouth 2 (two) times daily. (Patient taking differently: Take 1 tablet by mouth at bedtime. ) 120 each 0  .  traMADol (ULTRAM) 50 MG tablet tramadol 50 mg tablet    . Turmeric 500 MG TABS Take 1,000 mg by mouth daily.    . Vitamin D, Cholecalciferol, 1000 units CAPS Take 1 Units by mouth 3 (three) times daily.     . vitamin E 400 UNIT capsule Take 400 Units by mouth daily.    Marland Kitchen zolpidem (AMBIEN) 10 MG tablet Take 1 tablet (10 mg total) by mouth at bedtime as needed for sleep.  30 tablet 4    Results for orders placed or performed during the hospital encounter of 10-11-2018 (from the past 48 hour(s))  Glucose, capillary     Status: None   Collection Time: 10/11/2018 10:01 AM  Result Value Ref Range   Glucose-Capillary 95 70 - 99 mg/dL   No results found.  ROS no recent fever, chills, nausea, vomiting or changes in her appetite  Blood pressure 99/65, pulse (!) 56, temperature (!) 97.5 F (36.4 C), temperature source Oral, resp. rate 18, height 5\' 3"  (1.6 m), weight 122 kg, SpO2 96 %. Physical Exam  Well-nourished well-developed woman in no apparent distress.  Alert and oriented x4.  Mood and affect are normal.  Extraocular motions are intact.  Respirations are unlabored.  Gait is antalgic to the right.  Right ankle has a moderate effusion and mild swelling.  Surgical incisions of healed.  No signs of infection.  5 out of 5 strength in plantar flexion and dorsiflexion of the ankle and toes.  Pulses are palpable.  Intact sensibility to light touch dorsally and plantarly at the forefoot.  Assessment/Plan Painful hardware at the right ankle fibula and tibia, right ankle synovitis -to the OR today for right ankle arthroscopy and removal of deep implants from the fibula and tibia.  The risks and benefits of the alternative treatment options have been discussed in detail.  The patient wishes to proceed with surgery and specifically understands risks of bleeding, infection, nerve damage, blood clots, need for additional surgery, amputation and death.   Toni Arthurs, MD 2018/10/11, 11:06 AM

## 2018-09-17 NOTE — Anesthesia Preprocedure Evaluation (Addendum)
Anesthesia Evaluation  Patient identified by MRN, date of birth, ID band Patient awake    Reviewed: Allergy & Precautions, H&P , NPO status , Patient's Chart, lab work & pertinent test results  History of Anesthesia Complications (+) Family history of anesthesia reaction  Airway Mallampati: II   Neck ROM: full    Dental  (+) Dental Advisory Given   Pulmonary asthma , former smoker,    breath sounds clear to auscultation       Cardiovascular hypertension, + Peripheral Vascular Disease   Rhythm:regular Rate:Normal     Neuro/Psych  Headaches,  Neuromuscular disease CVA    GI/Hepatic hiatal hernia, PUD, GERD  ,  Endo/Other  diabetes, Type obesity  Renal/GU Renal InsufficiencyRenal disease     Musculoskeletal  (+) Arthritis , Fibromyalgia -  Abdominal (+) + obese,   Peds  Hematology   Anesthesia Other Findings   Reproductive/Obstetrics                             Anesthesia Physical  Anesthesia Plan  ASA: III  Anesthesia Plan: General   Post-op Pain Management:    Induction: Intravenous  PONV Risk Score and Plan: 3 and Ondansetron, Dexamethasone and Treatment may vary due to age or medical condition  Airway Management Planned: LMA  Additional Equipment:   Intra-op Plan:   Post-operative Plan: Extubation in OR  Informed Consent: I have reviewed the patients History and Physical, chart, labs and discussed the procedure including the risks, benefits and alternatives for the proposed anesthesia with the patient or authorized representative who has indicated his/her understanding and acceptance.   Dental advisory given  Plan Discussed with: CRNA  Anesthesia Plan Comments:         Anesthesia Quick Evaluation

## 2018-09-17 NOTE — Op Note (Signed)
09/17/2018  12:58 PM  PATIENT:  Chloe [name]  72 y.o. female  PRE-OPERATIVE DIAGNOSIS:  Right ankle painful hardware; synovitis  POST-OPERATIVE DIAGNOSIS:   1.  Right ankle painful hardware 2.  Right ankle synovitis 3.  Right ankle loose body (1 cm x 1 cm) 4.  Extensive right ankle arthritis  Procedure(s): 1.  Right ankle arthroscopy with extensive debridement and removal of loose body (1 cm x 1 cm) 2.  Right ankle removal of deep implants from the fibula through a separate incision  SURGEON:  Toni Arthurs, MD  ASSISTANT: Alfredo Martinez, PA-C  ANESTHESIA:   General  EBL:  minimal   TOURNIQUET:   Total Tourniquet Time Documented: Thigh (Right) - 53 minutes Total: Thigh (Right) - 53 minutes  COMPLICATIONS:  None apparent  DISPOSITION:  Extubated, awake and stable to recovery.  INDICATION FOR PROCEDURE: The patient is a 72 year old female with a history of right ankle fracture and syndesmosis disruption.  She underwent open treatment and syndesmosis repair with internal fixation.  She has continued ankle pain laterally at the fibula at the hardware and pain at the ankle joint from synovitis.  She has failed nonoperative treatment to date and presents today for surgical treatment.  The risks and benefits of the alternative treatment options have been discussed in detail.  The patient wishes to proceed with surgery and specifically understands risks of bleeding, infection, nerve damage, blood clots, need for additional surgery, amputation and death.  PROCEDURE IN DETAIL:  After pre operative consent was obtained, and the correct operative site was identified, the patient was brought to the operating room and placed supine on the OR table.  Anesthesia was administered.  Pre-operative antibiotics were administered.  A surgical timeout was taken.  The right lower extremity was prepped and draped in standard sterile fashion with a tourniquet around the thigh.  The extremity was elevated and  the tourniquet was inflated to 350 mmHg.  An anteromedial arthroscopy portal was then made using the nick and spread technique.  The arthroscope was inserted into the ankle joint.  Immediately evident was significant synovitis at the medial and anterior gutters.  The arthroscopic shaver was inserted through an anterolateral portal again using the nick and spread technique.  The shaver was used to resect synovitis from the anterior and medial gutters.  A large loose body was noted at the anterolateral joint line.  This fragment was grasped and removed.  The ankle joint was noted to have extensive degenerative changes with full thickness cartilage loss on both sides of the joint.  Collene Mares was used to remove synovitis from the lateral gutter.  Loose flaps of cartilage were debrided with the shaver as well.  The arthroscopic instruments were removed and the portals were closed with nylon.  Attention was turned to the lateral malleolus where the patient's previous incision was identified.  The incision was opened again sharply and dissection was carried down through the subcutaneous tissues to the level of the plate.  Superficial fascia was released off of the plate exposing it appropriately.  The distal locking screws were all removed without difficulty.  The proximal bicortical screws were all removed as well.  The 3 syndesmosis screws were removed followed by the plate.  The wound was irrigated copiously.  Vancomycin powder was speckled in the wound.  Vicryl and Monocryl sutures were used to close the deep and superficial subcutaneous layers.  Skin incision was closed with staples.  Sterile dressings were applied followed by a  compression wrap.  Soft tissues around the incision were infiltrated with Exparel after closure of the wounds.  The tourniquet was released after application of the dressings.  The patient was awakened from anesthesia and transported to the recovery room in stable condition.   FOLLOW UP  PLAN: Weightbearing as tolerated on the right lower extremity in a postop boot.  Follow-up with me in the office for suture removal in 2 weeks.   Alfredo Martinez PA-C was present and scrubbed for the duration of the operative case. His assistance was essential in positioning the patient, prepping and draping, gaining and maintaining exposure, performing the operation, closing and dressing the wounds and applying the splint.

## 2018-09-17 NOTE — Transfer of Care (Signed)
Immediate Anesthesia Transfer of Care Note  Patient: Chloe [name]  Procedure(s) Performed: Right ankle removal of deep implants tibia and fibula (Right Ankle) Right ankle arthroscopy with extensive debridement, removal of loose body (Right Ankle)  Patient Location: PACU  Anesthesia Type:General  Level of Consciousness: awake, sedated and patient cooperative  Airway & Oxygen Therapy: Patient Spontanous Breathing and Patient connected to face mask oxygen  Post-op Assessment: Report given to RN and Post -op Vital signs reviewed and stable  Post vital signs: Reviewed and stable  Last Vitals:  Vitals Value Taken Time  BP    Temp    Pulse 73 09/17/2018 12:54 PM  Resp    SpO2 96 % 09/17/2018 12:54 PM  Vitals shown include unvalidated device data.  Last Pain:  Vitals:   09/17/18 0957  TempSrc: Oral  PainSc: 7       Patients Stated Pain Goal: 4 (09/17/18 0957)  Complications: No apparent anesthesia complications

## 2018-09-18 ENCOUNTER — Encounter (HOSPITAL_BASED_OUTPATIENT_CLINIC_OR_DEPARTMENT_OTHER): Payer: Self-pay | Admitting: Orthopedic Surgery

## 2018-09-24 ENCOUNTER — Encounter (HOSPITAL_BASED_OUTPATIENT_CLINIC_OR_DEPARTMENT_OTHER): Payer: Self-pay | Admitting: Orthopedic Surgery

## 2018-10-14 ENCOUNTER — Other Ambulatory Visit (HOSPITAL_COMMUNITY): Payer: Self-pay | Admitting: Orthopedic Surgery

## 2018-10-15 ENCOUNTER — Encounter (HOSPITAL_BASED_OUTPATIENT_CLINIC_OR_DEPARTMENT_OTHER): Payer: Self-pay | Admitting: *Deleted

## 2018-10-15 ENCOUNTER — Other Ambulatory Visit: Payer: Self-pay

## 2018-10-15 IMAGING — DX DG ANKLE PORT 2V*R*
1 series · 1 of 1 positions shown · non-contrast
Comparison: Plain film of the right ankle dated 01/04/2017.

CLINICAL DATA: Postop check.

EXAM:
PORTABLE RIGHT ANKLE - 2 VIEW

[ankle obl]
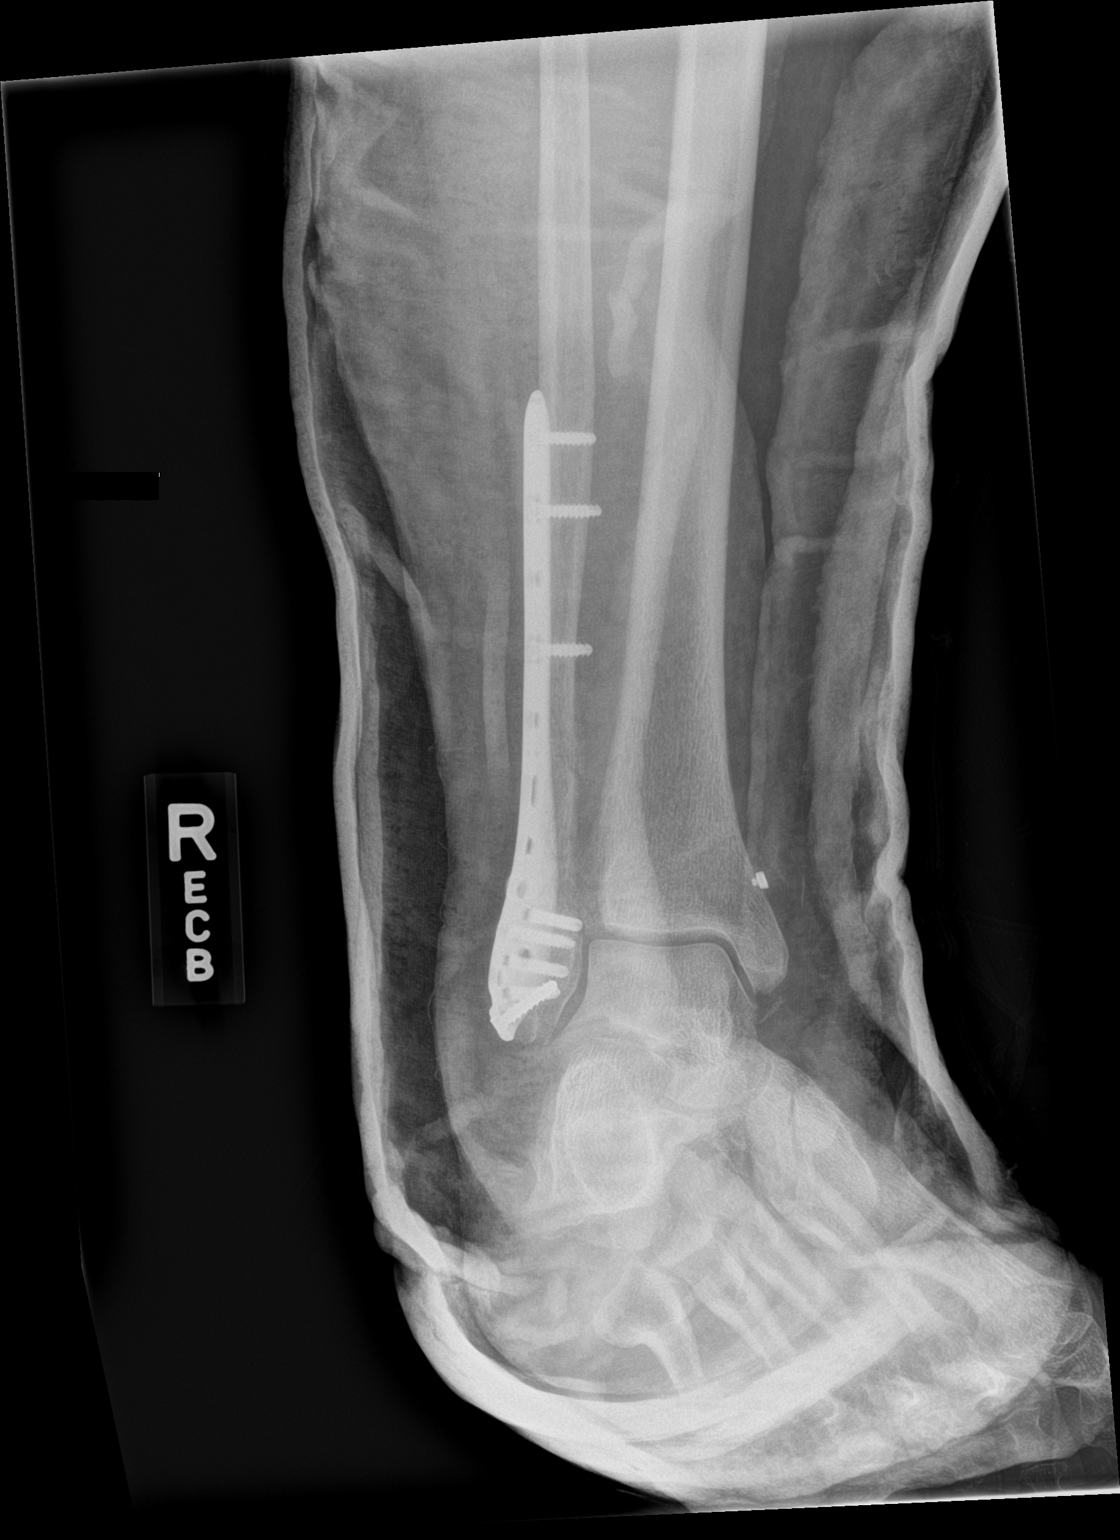

[1 of 1 positions shown; findings below may reference images not displayed]

FINDINGS: Interval plate and screw fixation of the distal right fibula,
traversing the previously demonstrated fibula fracture. Alignment is
now anatomic. Plate and screw fixation hardware appears intact and
well positioned.

The radiodense linear hardware overlying the medial malleolus is
stable in position. Distal tibia is normally aligned. Ankle mortise
is symmetric.
IMPRESSION: Plate and screw fixation hardware traversing the distal fibula
fracture site. Hardware appears intact and appropriately positioned.
Osseous alignment is anatomic. No evidence of surgical complicating
feature.

## 2018-10-15 NOTE — Progress Notes (Signed)
Pt here 09/17/18 with Dr Victorino Dike for ankle surgery, returning 10/31.  Previous card notes, echo and pmh reviewed with Dr Tacy Dura. Ok for surgery 10/22/18. Will need [employer].

## 2018-10-20 ENCOUNTER — Encounter: Payer: Self-pay | Admitting: Neurology

## 2018-10-20 ENCOUNTER — Other Ambulatory Visit: Payer: Self-pay

## 2018-10-20 ENCOUNTER — Ambulatory Visit: Payer: Medicare Other | Admitting: Neurology

## 2018-10-20 VITALS — BP 136/55 | HR 61 | Ht 63.0 in | Wt 269.0 lb

## 2018-10-20 DIAGNOSIS — M797 Fibromyalgia: Secondary | ICD-10-CM

## 2018-10-20 DIAGNOSIS — M545 Low back pain, unspecified: Secondary | ICD-10-CM

## 2018-10-20 DIAGNOSIS — G8929 Other chronic pain: Secondary | ICD-10-CM | POA: Diagnosis not present

## 2018-10-20 MED ORDER — ZOLPIDEM TARTRATE 10 MG PO TABS: 10.0000 mg | ORAL_TABLET | Freq: Every evening | ORAL | 4 refills | Status: DC | PRN

## 2018-10-20 NOTE — Progress Notes (Signed)
Reason for visit: Fibromyalgia, chronic low back pain  Chloe [name] is an 72 y.o. female  History of present illness:  Chloe [name] is a 72 year old right-handed white female with a history of morbid obesity, fibromyalgia, chronic low back pain and right hip pain.  The patient has not contacted her orthopedic surgeon about her hip pain, x-rays have not shown significant arthritis.  The patient has had several surgeries on her right ankle, she will require surgery in the next day or 2.  She still has low back pain, she takes Cymbalta for this.  The patient has not had any falls since last seen, she uses a cane for ambulation.  She returns to this office for an evaluation.  The patient also takes gabapentin 400 mg, 2 in the morning, 2 at suppertime and 2 at night, she takes 3 at noon.  Past Medical History:  Diagnosis Date  . Ankle syndesmosis disruption, right, sequela 03/13/2017  . Cervical spondylosis without myelopathy 08/10/2015  . Chronic kidney disease (CKD) 09/08/2018  . Chronic kidney disease (CKD), stage III (moderate) (HCC)   . Chronic low back pain 02/19/2017  . Chronic lower back pain   . Closed fracture dislocation of right ankle 01/07/2017  . Diabetic peripheral neuropathy associated with type 2 diabetes mellitus (HCC) 09/08/2018  . Dissection of cerebral artery (HCC) 1997   "no OR"  . Endometriosis   . Family history of adverse reaction to anesthesia    "daughter has PONV"  . Fibromyalgia   . GERD (gastroesophageal reflux disease)   . GERD without esophagitis 09/08/2018  . Headache 08/16/2013  . High cholesterol    hx  . History of hiatal hernia   . Hypertension   . Hypertensive heart disease without heart failure 09/08/2018  . Mixed hyperlipidemia 09/08/2018  . Morbid obesity (HCC) 09/08/2018  . Myalgia and myositis 08/16/2013  . Obesity   . Occipital headache    "qd" (01/10/2017)  . Orthostatic hypotension 02/02/2015  . Osteoarthritis 09/08/2018  . Plantar fasciitis     . Seasonal asthma   . Shingles   . Stomach ulcer    "I take RX for it" (01/10/2017)  . Stroke [facility]) 1997; 08/2016   mild right arm weakness  . Syncope and collapse 08/16/2013  . Syndesmotic disruption of ankle, right, sequela 03/13/2017  . Type II diabetes mellitus (HCC)   . Vertebral artery dissection [facility])    right     Past Surgical History:  Procedure Laterality Date  . ANKLE ARTHROSCOPY Right 09/17/2018   Procedure: Right ankle arthroscopy with extensive debridement, removal of loose body;  Surgeon: Toni Arthurs, MD;  Location: [facility];  Service: Orthopedics;  Laterality: Right;  . ANKLE CLOSED REDUCTION Right 01/07/2017   Procedure: CLOSED REDUCTION ANKLE;  Surgeon: Samson Frederic, MD;  [facility];  Service: Orthopedics;  Laterality: Right;  . APPENDECTOMY    . CATARACT EXTRACTION W/ INTRAOCULAR LENS  IMPLANT, BILATERAL  03/2014 - 04/2014   left - right   . CLOSED REDUCTION NASAL FRACTURE  1990"s  . EXTERNAL FIXATION LEG Right 01/07/2017   Procedure: EXTERNAL FIXATION LEG;  Surgeon: Samson Frederic, MD;  [facility]: [facility];  Service: Orthopedics;  Laterality: Right;  . FRACTURE SURGERY    . HARDWARE REMOVAL Right 09/17/2018   Procedure: Right ankle removal of deep implants tibia and fibula;  Surgeon: Toni Arthurs, MD;  Location: [facility];  Service: Orthopedics;  Laterality: Right;  . JOINT REPLACEMENT    .  LAPAROSCOPIC CHOLECYSTECTOMY    . OPEN REDUCTION INTERNAL FIXATION (ORIF) TIBIA/FIBULA FRACTURE Right 01/20/2017   Procedure: OPEN REDUCTION INTERNAL FIXATION RIGHT ANKLE;  Surgeon: Samson Frederic, MD;  [facility];  Service: Orthopedics;  Laterality: Right;  . SALPINGOOPHORECTOMY  1978  . SYNDESMOSIS REPAIR Right 03/13/2017   Procedure: HARDWARE REMOVAL OF RIGHT ABKLE, OPEN REDUCTION INTERNAL FIXATION OF SYNDESMOTIC INJURY;  Surgeon: Samson Frederic, MD;  Location: [facility];  Service: Orthopedics;  Laterality: Right;  Popliteal block  .  TONSILLECTOMY    . TOTAL KNEE ARTHROPLASTY Bilateral   . TUBAL LIGATION  1978  . VAGINAL HYSTERECTOMY  1979   "partial"    Family History  Problem Relation Age of Onset  . Alzheimer's disease Mother   . CAD Mother   . Diabetes Mother   . Heart attack Mother   . Congestive Heart Failure Father   . Diabetes Father   . Diabetes Sister   . Hypertension Brother   . Diabetes Brother   . Diabetes Brother     Social history:  reports that she quit smoking about 34 years ago. Her smoking use included cigarettes. She has a 5.50 pack-year smoking history. She has never used smokeless tobacco. She reports that she does not drink alcohol or use drugs.    Allergies  Allergen Reactions  . Adhesive [Tape] Hives and Rash  . Codeine Nausea Only  . Fenofibrate Other (See Comments)    Hair loss  . Simvastatin Rash    Medications:  Prior to Admission medications   Medication Sig Start Date End Date Taking? Authorizing Provider  alendronate (FOSAMAX) 70 MG tablet Take 70 mg by mouth once a week. Take with a full glass of water on an empty stomach.   Yes [provider]  aspirin 81 MG tablet Take 81 mg by mouth daily.   Yes [provider]  Biotin 3 MG TABS Take 300 mcg by mouth daily.    Yes [provider]  CALCIUM PO Take 1,000 mg by mouth daily.    Yes [provider]  Cinnamon 500 MG TABS Take 1,000 mg by mouth 2 (two) times daily.    Yes [provider]  dicyclomine (BENTYL) 20 MG tablet Take 20 mg by mouth 3 (three) times daily.    Yes [provider]  docusate sodium (COLACE) 100 MG capsule Take 1 capsule (100 mg total) by mouth 2 (two) times daily. 01/08/17  Yes Swinteck, Arlys John, MD  DULoxetine (CYMBALTA) 60 MG capsule TAKE 1 CAPSULE BY MOUTH TWICE DAILY 02/04/18  Yes York Spaniel, MD  furosemide (LASIX) 40 MG tablet Take 80mg s every morning and 40mg s at 4pm   Yes [provider]  gabapentin (NEURONTIN) 400 MG capsule  Take 2 caps in the morning, supper time and  Night . Take 3 caps at noon. 02/20/18  Yes York Spaniel, MD  insulin glargine (LANTUS) 100 UNIT/ML injection Inject into the skin. 40 units in the am   Yes [provider]  Insulin Lispro, Human, (HUMALOG Perkins) Inject into the skin. 70-150 inject 3 units, 151-200 = 5 units, 201-250 = 7 units, 151-300 = 9 units, 301-350 = 11 units, 351-400 = 13 units, >401 = 16 units and notify Dr Patient does humalog with meals only   Yes [provider]  Methylcellulose, Laxative, (FIBER THERAPY) 500 MG TABS Take 1,000 mg by mouth daily.   Yes [provider]  metoprolol tartrate (LOPRESSOR) 25 MG tablet Take 25 mg  by mouth 2 (two) times daily.   Yes [provider]  omeprazole (PRILOSEC) 20 MG capsule Take 20 mg by mouth 2 (two) times daily.    Yes [provider]  oxyCODONE (ROXICODONE) 5 MG immediate release tablet Take 1 tablet (5 mg total) by mouth every 6 (six) hours as needed for severe pain. 09/17/18  Yes Toni Arthurs, MD  Psyllium (METAMUCIL PO) Take 2 capsules by mouth daily.   Yes [provider]  ramipril (ALTACE) 10 MG capsule Take 10 mg by mouth 2 (two) times daily.    Yes [provider]  rosuvastatin (CRESTOR) 5 MG tablet Take 5 mg by mouth daily.   Yes [provider]  senna (SENOKOT) 8.6 MG TABS tablet Take 1 tablet (8.6 mg total) by mouth 2 (two) times daily. Patient taking differently: Take 1 tablet by mouth at bedtime.  01/08/17  Yes Swinteck, Arlys John, MD  traMADol (ULTRAM) 50 MG tablet tramadol 50 mg tablet   Yes [provider]  Turmeric 500 MG TABS Take 1,000 mg by mouth daily.   Yes [provider]  Vitamin D, Cholecalciferol, 1000 units CAPS Take 1 Units by mouth 3 (three) times daily.    Yes [provider]  vitamin E 400 UNIT capsule Take 400 Units by mouth daily.   Yes [provider]  zolpidem (AMBIEN) 10 MG tablet Take 1 tablet (10 mg  total) by mouth at bedtime as needed for sleep. 10/20/18 11/19/18 Yes York Spaniel, MD    ROS:  Out of a complete 14 system review of symptoms, the patient complains only of the following symptoms, and all other reviewed systems are negative.  Ear pain, left-sided Low back pain, neck pain, neck stiffness  Blood pressure (!) 136/55, pulse 61, height 5\' 3"  (1.6 m), weight 269 lb (122 kg), head circumference 20" (50.8 cm).  Physical Exam  General: The patient is alert and cooperative at the time of the examination.  The patient is morbidly obese.  Skin: No significant peripheral edema is noted.  The patient has a brace on her right foot and ankle.   Neurologic Exam  Mental status: The patient is alert and oriented x 3 at the time of the examination. The patient has apparent normal recent and remote memory, with an apparently normal attention span and concentration ability.   Cranial nerves: Facial symmetry is present. Speech is normal, no aphasia or dysarthria is noted. Extraocular movements are full. Visual fields are full.  Motor: The patient has good strength in all 4 extremities.  Sensory examination: Soft touch sensation is symmetric on the face, arms, and legs.  Coordination: The patient has good finger-nose-finger and heel-to-shin bilaterally.  Gait and station: The patient has a limping type gait on the right foot.  Romberg is negative. No drift is seen.  Reflexes: Deep tendon reflexes are symmetric, but are depressed.   Assessment/Plan:  1.  Morbid obesity  2.  Chronic low back pain  3.  Fibromyalgia  Patient will continue her gabapentin and Cymbalta, the prescription for Ambien was sent in.  The patient will follow-up in 6 months.  The patient needs to develop a regular exercise program once her right foot is completely healed.  She continues to have right hip pain, she will need to contact her orthopedic surgeon regarding this.   Marlan Palau  MD 10/20/2018 12:40 PM  [facility] [address] [place], [place] [address]  Phone [phone] Fax [phone]

## 2018-10-22 ENCOUNTER — Other Ambulatory Visit: Payer: Self-pay

## 2018-10-22 ENCOUNTER — Ambulatory Visit (HOSPITAL_BASED_OUTPATIENT_CLINIC_OR_DEPARTMENT_OTHER): Payer: Medicare Other | Admitting: Anesthesiology

## 2018-10-22 ENCOUNTER — Encounter (HOSPITAL_BASED_OUTPATIENT_CLINIC_OR_DEPARTMENT_OTHER): Payer: Self-pay

## 2018-10-22 ENCOUNTER — Ambulatory Visit (HOSPITAL_BASED_OUTPATIENT_CLINIC_OR_DEPARTMENT_OTHER)
Admission: RE | Admit: 2018-10-22 | Discharge: 2018-10-22 | Disposition: A | Discharge status: Home / Self Care | Payer: Medicare Other | Source: Ambulatory Visit | Attending: Orthopedic Surgery | Admitting: Orthopedic Surgery

## 2018-10-22 ENCOUNTER — Encounter (HOSPITAL_BASED_OUTPATIENT_CLINIC_OR_DEPARTMENT_OTHER)
Admission: RE | Disposition: A | Discharge status: Home / Self Care | Payer: Self-pay | Source: Ambulatory Visit | Attending: Orthopedic Surgery

## 2018-10-22 DIAGNOSIS — Y838 Other surgical procedures as the cause of abnormal reaction of the patient, or of later complication, without mention of misadventure at the time of the procedure: Secondary | ICD-10-CM | POA: Diagnosis not present

## 2018-10-22 DIAGNOSIS — T8131XA Disruption of external operation (surgical) wound, not elsewhere classified, initial encounter: Secondary | ICD-10-CM | POA: Diagnosis present

## 2018-10-22 DIAGNOSIS — Z7982 Long term (current) use of aspirin: Secondary | ICD-10-CM | POA: Diagnosis not present

## 2018-10-22 DIAGNOSIS — I1310 Hypertensive heart and chronic kidney disease without heart failure, with stage 1 through stage 4 chronic kidney disease, or unspecified chronic kidney disease: Secondary | ICD-10-CM | POA: Insufficient documentation

## 2018-10-22 DIAGNOSIS — Z87891 Personal history of nicotine dependence: Secondary | ICD-10-CM | POA: Diagnosis not present

## 2018-10-22 DIAGNOSIS — Z79899 Other long term (current) drug therapy: Secondary | ICD-10-CM | POA: Insufficient documentation

## 2018-10-22 DIAGNOSIS — Z794 Long term (current) use of insulin: Secondary | ICD-10-CM | POA: Diagnosis not present

## 2018-10-22 DIAGNOSIS — N183 Chronic kidney disease, stage 3 (moderate): Secondary | ICD-10-CM | POA: Insufficient documentation

## 2018-10-22 DIAGNOSIS — Z8673 Personal history of transient ischemic attack (TIA), and cerebral infarction without residual deficits: Secondary | ICD-10-CM | POA: Diagnosis not present

## 2018-10-22 DIAGNOSIS — Z888 Allergy status to other drugs, medicaments and biological substances status: Secondary | ICD-10-CM | POA: Diagnosis not present

## 2018-10-22 DIAGNOSIS — M797 Fibromyalgia: Secondary | ICD-10-CM | POA: Diagnosis not present

## 2018-10-22 DIAGNOSIS — E1142 Type 2 diabetes mellitus with diabetic polyneuropathy: Secondary | ICD-10-CM | POA: Insufficient documentation

## 2018-10-22 DIAGNOSIS — T8183XD Persistent postprocedural fistula, subsequent encounter: Secondary | ICD-10-CM

## 2018-10-22 DIAGNOSIS — K219 Gastro-esophageal reflux disease without esophagitis: Secondary | ICD-10-CM | POA: Diagnosis not present

## 2018-10-22 DIAGNOSIS — Z6841 Body Mass Index (BMI) 40.0 and over, adult: Secondary | ICD-10-CM | POA: Diagnosis not present

## 2018-10-22 DIAGNOSIS — E1122 Type 2 diabetes mellitus with diabetic chronic kidney disease: Secondary | ICD-10-CM | POA: Diagnosis not present

## 2018-10-22 DIAGNOSIS — Z8719 Personal history of other diseases of the digestive system: Secondary | ICD-10-CM | POA: Diagnosis not present

## 2018-10-22 HISTORY — PX: I & D EXTREMITY: SHX5045

## 2018-10-22 LAB — POCT I-STAT, CHEM 8
BUN: 17 mg/dL (ref 8–23)
CHLORIDE: 106 mmol/L (ref 98–111)
Calcium, Ion: 1.16 mmol/L (ref 1.15–1.40)
Creatinine, Ser: 1.5 mg/dL — ABNORMAL HIGH (ref 0.44–1.00)
Glucose, Bld: 96 mg/dL (ref 70–99)
HCT: 34 % — ABNORMAL LOW (ref 36.0–46.0)
Hemoglobin: 11.6 g/dL — ABNORMAL LOW (ref 12.0–15.0)
POTASSIUM: 3.6 mmol/L (ref 3.5–5.1)
SODIUM: 142 mmol/L (ref 135–145)
TCO2: 26 mmol/L (ref 22–32)

## 2018-10-22 LAB — GLUCOSE, CAPILLARY: Glucose-Capillary: 98 mg/dL (ref 70–99)

## 2018-10-22 SURGERY — IRRIGATION AND DEBRIDEMENT EXTREMITY
Anesthesia: General | Site: Ankle | Laterality: Right

## 2018-10-22 MED ORDER — BUPIVACAINE-EPINEPHRINE (PF) 0.25% -1:200000 IJ SOLN
INTRAMUSCULAR | Status: DC | PRN
  Administered 2018-10-22: 10 mL

## 2018-10-22 MED ORDER — MIDAZOLAM HCL 2 MG/2ML IJ SOLN
1.0000 mg | INTRAMUSCULAR | Status: DC | PRN
  Administered 2018-10-22: 1 mg via INTRAVENOUS

## 2018-10-22 MED ORDER — DEXTROSE 5 % IV SOLN
3.0000 g | Freq: Once | INTRAVENOUS | Status: AC
  Administered 2018-10-22: 3 g via INTRAVENOUS

## 2018-10-22 MED ORDER — CEFAZOLIN SODIUM-DEXTROSE 1-4 GM/50ML-% IV SOLN
INTRAVENOUS | Status: AC
  Filled 2018-10-22: qty 50

## 2018-10-22 MED ORDER — CEFAZOLIN SODIUM-DEXTROSE 2-4 GM/100ML-% IV SOLN
INTRAVENOUS | Status: AC
  Filled 2018-10-22: qty 100

## 2018-10-22 MED ORDER — SODIUM CHLORIDE 0.9 % IV SOLN: INTRAVENOUS | Status: DC

## 2018-10-22 MED ORDER — SCOPOLAMINE 1 MG/3DAYS TD PT72: 1.0000 | MEDICATED_PATCH | Freq: Once | TRANSDERMAL | Status: DC | PRN

## 2018-10-22 MED ORDER — CEFAZOLIN SODIUM-DEXTROSE 2-4 GM/100ML-% IV SOLN: 2.0000 g | INTRAVENOUS | Status: DC

## 2018-10-22 MED ORDER — ONDANSETRON HCL 4 MG/2ML IJ SOLN
INTRAMUSCULAR | Status: DC | PRN
  Administered 2018-10-22: 4 mg via INTRAVENOUS

## 2018-10-22 MED ORDER — METOCLOPRAMIDE HCL 5 MG/ML IJ SOLN: 10.0000 mg | Freq: Once | INTRAMUSCULAR | Status: DC | PRN

## 2018-10-22 MED ORDER — SUCCINYLCHOLINE CHLORIDE 200 MG/10ML IV SOSY
PREFILLED_SYRINGE | INTRAVENOUS | Status: AC
  Filled 2018-10-22: qty 10

## 2018-10-22 MED ORDER — MIDAZOLAM HCL 2 MG/2ML IJ SOLN
INTRAMUSCULAR | Status: AC
  Filled 2018-10-22: qty 2

## 2018-10-22 MED ORDER — ONDANSETRON HCL 4 MG/2ML IJ SOLN
INTRAMUSCULAR | Status: AC
  Filled 2018-10-22: qty 2

## 2018-10-22 MED ORDER — FENTANYL CITRATE (PF) 100 MCG/2ML IJ SOLN: 25.0000 ug | INTRAMUSCULAR | Status: DC | PRN

## 2018-10-22 MED ORDER — FENTANYL CITRATE (PF) 100 MCG/2ML IJ SOLN
50.0000 ug | INTRAMUSCULAR | Status: DC | PRN
  Administered 2018-10-22: 50 ug via INTRAVENOUS

## 2018-10-22 MED ORDER — GLYCOPYRROLATE PF 0.2 MG/ML IJ SOSY
PREFILLED_SYRINGE | INTRAMUSCULAR | Status: AC
  Filled 2018-10-22: qty 1

## 2018-10-22 MED ORDER — PROPOFOL 500 MG/50ML IV EMUL
INTRAVENOUS | Status: AC
  Filled 2018-10-22: qty 50

## 2018-10-22 MED ORDER — LIDOCAINE 2% (20 MG/ML) 5 ML SYRINGE
INTRAMUSCULAR | Status: AC
  Filled 2018-10-22: qty 5

## 2018-10-22 MED ORDER — LACTATED RINGERS IV SOLN
INTRAVENOUS | Status: DC
  Administered 2018-10-22: 10:00:00 via INTRAVENOUS

## 2018-10-22 MED ORDER — FENTANYL CITRATE (PF) 100 MCG/2ML IJ SOLN
INTRAMUSCULAR | Status: AC
  Filled 2018-10-22: qty 2

## 2018-10-22 MED ORDER — PHENYLEPHRINE 40 MCG/ML (10ML) SYRINGE FOR IV PUSH (FOR BLOOD PRESSURE SUPPORT)
PREFILLED_SYRINGE | INTRAVENOUS | Status: AC
  Filled 2018-10-22: qty 10

## 2018-10-22 MED ORDER — LIDOCAINE HCL (CARDIAC) PF 100 MG/5ML IV SOSY
PREFILLED_SYRINGE | INTRAVENOUS | Status: DC | PRN
  Administered 2018-10-22: 50 mg via INTRAVENOUS

## 2018-10-22 MED ORDER — CHLORHEXIDINE GLUCONATE 4 % EX LIQD: 60.0000 mL | Freq: Once | CUTANEOUS | Status: DC

## 2018-10-22 MED ORDER — MEPERIDINE HCL 25 MG/ML IJ SOLN: 6.2500 mg | INTRAMUSCULAR | Status: DC | PRN

## 2018-10-22 MED ORDER — PROPOFOL 10 MG/ML IV BOLUS
INTRAVENOUS | Status: DC | PRN
  Administered 2018-10-22: 150 mg via INTRAVENOUS

## 2018-10-22 MED ORDER — EPHEDRINE 5 MG/ML INJ
INTRAVENOUS | Status: AC
  Filled 2018-10-22: qty 10

## 2018-10-22 MED ORDER — DEXAMETHASONE SODIUM PHOSPHATE 10 MG/ML IJ SOLN
INTRAMUSCULAR | Status: AC
  Filled 2018-10-22: qty 1

## 2018-10-22 MED ORDER — EPHEDRINE SULFATE 50 MG/ML IJ SOLN
INTRAMUSCULAR | Status: DC | PRN
  Administered 2018-10-22 (×2): 15 mg via INTRAVENOUS

## 2018-10-22 SURGICAL SUPPLY — 64 items
BANDAGE ACE 4X5 VEL STRL LF (GAUZE/BANDAGES/DRESSINGS) ×2 IMPLANT
BANDAGE ESMARK 6X9 LF (GAUZE/BANDAGES/DRESSINGS) IMPLANT
BLADE SURG 15 STRL LF DISP TIS (BLADE) ×2 IMPLANT
BLADE SURG 15 STRL SS (BLADE) ×6
BNDG CMPR 9X4 STRL LF SNTH (GAUZE/BANDAGES/DRESSINGS) ×1
BNDG CMPR 9X6 STRL LF SNTH (GAUZE/BANDAGES/DRESSINGS)
BNDG COHESIVE 4X5 TAN STRL (GAUZE/BANDAGES/DRESSINGS) IMPLANT
BNDG COHESIVE 6X5 TAN STRL LF (GAUZE/BANDAGES/DRESSINGS) IMPLANT
BNDG ESMARK 4X9 LF (GAUZE/BANDAGES/DRESSINGS) ×3 IMPLANT
BNDG ESMARK 6X9 LF (GAUZE/BANDAGES/DRESSINGS)
BNDG GAUZE ELAST 4 BULKY (GAUZE/BANDAGES/DRESSINGS) IMPLANT
BOOT STEPPER DURA LG (SOFTGOODS) IMPLANT
CHLORAPREP W/TINT 26ML (MISCELLANEOUS) ×2 IMPLANT
COVER BACK TABLE 60X90IN (DRAPES) ×3 IMPLANT
COVER WAND RF STERILE (DRAPES) IMPLANT
CUFF TOURNIQUET SINGLE 24IN (TOURNIQUET CUFF) IMPLANT
CUFF TOURNIQUET SINGLE 34IN LL (TOURNIQUET CUFF) IMPLANT
CUFF TOURNIQUET SINGLE 44IN (TOURNIQUET CUFF) IMPLANT
DRAPE EXTREMITY T 121X128X90 (DRAPE) ×3 IMPLANT
DRAPE INCISE IOBAN 66X45 STRL (DRAPES) IMPLANT
DRAPE SURG 17X23 STRL (DRAPES) ×3 IMPLANT
DRAPE U-SHAPE 47X51 STRL (DRAPES) ×3 IMPLANT
DRSG MEPITEL 4X7.2 (GAUZE/BANDAGES/DRESSINGS) ×2 IMPLANT
DRSG PAD ABDOMINAL 8X10 ST (GAUZE/BANDAGES/DRESSINGS) ×2 IMPLANT
GLOVE BIO SURGEON STRL SZ8 (GLOVE) ×3 IMPLANT
GLOVE BIOGEL M STRL SZ7.5 (GLOVE) ×2 IMPLANT
GLOVE BIOGEL PI IND STRL 7.0 (GLOVE) IMPLANT
GLOVE BIOGEL PI IND STRL 8 (GLOVE) ×2 IMPLANT
GLOVE BIOGEL PI INDICATOR 7.0 (GLOVE) ×2
GLOVE BIOGEL PI INDICATOR 8 (GLOVE) ×6
GLOVE ECLIPSE 8.0 STRL XLNG CF (GLOVE) ×3 IMPLANT
GOWN STRL REUS W/ TWL LRG LVL3 (GOWN DISPOSABLE) ×1 IMPLANT
GOWN STRL REUS W/ TWL XL LVL3 (GOWN DISPOSABLE) ×2 IMPLANT
GOWN STRL REUS W/TWL LRG LVL3 (GOWN DISPOSABLE) ×3
GOWN STRL REUS W/TWL XL LVL3 (GOWN DISPOSABLE) ×6
MANIFOLD NEPTUNE II (INSTRUMENTS) ×3 IMPLANT
NEEDLE HYPO 22GX1.5 SAFETY (NEEDLE) ×2 IMPLANT
PACK BASIN DAY SURGERY FS (CUSTOM PROCEDURE TRAY) ×3 IMPLANT
PAD CAST 4YDX4 CTTN HI CHSV (CAST SUPPLIES) IMPLANT
PADDING CAST COTTON 4X4 STRL (CAST SUPPLIES) ×3
PADDING CAST COTTON 6X4 STRL (CAST SUPPLIES) IMPLANT
PENCIL BUTTON HOLSTER BLD 10FT (ELECTRODE) ×3 IMPLANT
SANITIZER HAND PURELL 535ML FO (MISCELLANEOUS) IMPLANT
SET IRRIG Y TYPE TUR BLADDER L (SET/KITS/TRAYS/PACK) ×3 IMPLANT
SHEET MEDIUM DRAPE 40X70 STRL (DRAPES) ×3 IMPLANT
SPLINT FAST PLASTER 5X30 (CAST SUPPLIES)
SPLINT PLASTER CAST FAST 5X30 (CAST SUPPLIES) IMPLANT
SPONGE LAP 18X18 RF (DISPOSABLE) ×3 IMPLANT
STOCKINETTE 6  STRL (DRAPES) ×2
STOCKINETTE 6 STRL (DRAPES) ×1 IMPLANT
SUT ETHILON 3 0 PS 1 (SUTURE) ×2 IMPLANT
SUT MNCRL AB 3-0 PS2 18 (SUTURE) ×2 IMPLANT
SUT VIC AB 2-0 SH 27 (SUTURE) ×3
SUT VIC AB 2-0 SH 27XBRD (SUTURE) IMPLANT
SWAB COLLECTION DEVICE MRSA (MISCELLANEOUS) IMPLANT
SWAB CULTURE ESWAB REG 1ML (MISCELLANEOUS) IMPLANT
SYR BULB 3OZ (MISCELLANEOUS) IMPLANT
SYR CONTROL 10ML LL (SYRINGE) ×2 IMPLANT
TOWEL GREEN STERILE FF (TOWEL DISPOSABLE) ×3 IMPLANT
TRAY DSU PREP LF (CUSTOM PROCEDURE TRAY) ×3 IMPLANT
TUBE CONNECTING 20'X1/4 (TUBING) ×1
TUBE CONNECTING 20X1/4 (TUBING) ×2 IMPLANT
UNDERPAD 30X30 (UNDERPADS AND DIAPERS) ×3 IMPLANT
YANKAUER SUCT BULB TIP NO VENT (SUCTIONS) ×3 IMPLANT

## 2018-10-22 NOTE — Anesthesia Postprocedure Evaluation (Signed)
Anesthesia Post Note  Patient: Chloe [name]  Procedure(s) Performed: Irrigation and debridement and closure of right ankle wound (Right Ankle)     Patient location during evaluation: PACU Anesthesia Type: General Level of consciousness: awake and alert Pain management: pain level controlled Vital Signs Assessment: post-procedure vital signs reviewed and stable Respiratory status: spontaneous breathing, nonlabored ventilation, respiratory function stable and patient connected to nasal cannula oxygen Cardiovascular status: blood pressure returned to baseline and stable Postop Assessment: no apparent nausea or vomiting Anesthetic complications: no    Last Vitals:  Vitals:   10/22/18 1100 10/22/18 1133  BP:  106/64  Pulse:  75  Resp:  12  Temp:  36.6 C  SpO2: 98% 96%    Last Pain:  Vitals:   10/22/18 1133  TempSrc: Oral  PainSc: 3                  Phillips Grout

## 2018-10-22 NOTE — Transfer of Care (Signed)
Immediate Anesthesia Transfer of Care Note  Patient: Chloe [name]  Procedure(s) Performed: Irrigation and debridement and closure of right ankle wound (Right Ankle)  Patient Location: PACU  Anesthesia Type:General  Level of Consciousness: awake, alert , oriented and drowsy  Airway & Oxygen Therapy: Patient Spontanous Breathing and Patient connected to face mask oxygen  Post-op Assessment: Report given to RN and Post -op Vital signs reviewed and stable  Post vital signs: Reviewed and stable  Last Vitals:  Vitals Value Taken Time  BP    Temp    Pulse 87 10/22/2018 10:53 AM  Resp 12 10/22/2018 10:53 AM  SpO2 92 % 10/22/2018 10:53 AM  Vitals shown include unvalidated device data.  Last Pain:  Vitals:   10/22/18 1005  TempSrc: Oral  PainSc: 0-No pain      Patients Stated Pain Goal: 3 (10/22/18 1005)  Complications: No apparent anesthesia complications

## 2018-10-22 NOTE — Op Note (Signed)
10/22/2018  10:55 AM  PATIENT:  Chloe [name]  72 y.o. female  PRE-OPERATIVE DIAGNOSIS:  Dehiscence of surgical wound right ankle  POST-OPERATIVE DIAGNOSIS:  Dehiscence of surgical wound right ankle  Procedure(s): Irrigation and debridement and closure of right ankle wound  SURGEON:  Toni Arthurs, MD  ASSISTANT: Alfredo Martinez, PA-C  ANESTHESIA:   General  EBL:  minimal   TOURNIQUET:  approx 10 min with ankle esmarch  COMPLICATIONS:  None apparent  DISPOSITION:  Extubated, awake and stable to recovery.  INDICATION FOR PROCEDURE: The patient is a 72 year old female who recently underwent right ankle arthroscopy.  She is had persistent drainage from her anteromedial ankle wound.  The drainage is clear and suggestive of arthro-cutaneous fistula.  She presents now for operative treatment. The risks and benefits of the alternative treatment options have been discussed in detail.  The patient wishes to proceed with surgery and specifically understands risks of bleeding, infection, nerve damage, blood clots, need for additional surgery, amputation and death.  PROCEDURE IN DETAIL:  After pre operative consent was obtained, and the correct operative site was identified, the patient was brought to the operating room and placed supine on the OR table.  Anesthesia was administered.  Pre-operative antibiotics were administered.  A surgical timeout was taken.  The right lower extremity was prepped and draped in standard sterile fashion.  The foot was exsanguinated and a 4 inch Esmarch tourniquet was wrapped around the ankle.  The anteromedial arthroscopy portal was identified.  An elliptical incision was made around the wound.  Sharp dissection was then carried down to the subcutaneous tissues and the skin incision was removed in its entirety.  A rondure was used to remove the tract down to the level of the joint line.  Wound was then irrigated copiously.  There was no evidence of seroma or abscess.   The joint capsule was repaired with 2-0 Vicryl pursestring suture.  The subcutaneous tissues were then approximated with inverted simple sutures of 3-0 Monocryl.  The skin incision was closed with horizontal and vertical mattress sutures of 3-0 nylon.  Sterile dressings were applied followed by a compression wrap.  Tourniquet was released after application of the dressings.  The patient was awakened from anesthesia and transported to the recovery room in stable condition.  FOLLOW UP PLAN: Weightbearing as tolerated on the right lower extremity.  Follow-up in 2 weeks for suture removal.   Alfredo Martinez PA-C was present and scrubbed for the duration of the operative case. His assistance was essential in positioning the patient, prepping and draping, gaining and maintaining exposure, performing the operation, closing and dressing the wounds and applying the splint.

## 2018-10-22 NOTE — Anesthesia Preprocedure Evaluation (Signed)
Anesthesia Evaluation  Patient identified by MRN, date of birth, ID band Patient awake    Reviewed: Allergy & Precautions, H&P , NPO status , Patient's Chart, lab work & pertinent test results  Airway Mallampati: II   Neck ROM: full    Dental  (+) Dental Advisory Given, Upper Dentures   Pulmonary asthma , former smoker,    breath sounds clear to auscultation       Cardiovascular hypertension, + Peripheral Vascular Disease   Rhythm:regular Rate:Normal     Neuro/Psych  Headaches,  Neuromuscular disease CVA    GI/Hepatic hiatal hernia, PUD, GERD  ,  Endo/Other  diabetes, Type obesity  Renal/GU Renal InsufficiencyRenal disease     Musculoskeletal  (+) Arthritis , Fibromyalgia -  Abdominal (+) + obese,   Peds  Hematology   Anesthesia Other Findings   Reproductive/Obstetrics                             Anesthesia Physical  Anesthesia Plan  ASA: III  Anesthesia Plan: General   Post-op Pain Management:    Induction: Intravenous  PONV Risk Score and Plan: 3 and Ondansetron, Dexamethasone and Treatment may vary due to age or medical condition  Airway Management Planned: LMA  Additional Equipment:   Intra-op Plan:   Post-operative Plan:   Informed Consent: I have reviewed the patients History and Physical, chart, labs and discussed the procedure including the risks, benefits and alternatives for the proposed anesthesia with the patient or authorized representative who has indicated his/her understanding and acceptance.   Dental advisory given  Plan Discussed with: CRNA  Anesthesia Plan Comments:         Anesthesia Quick Evaluation

## 2018-10-22 NOTE — H&P (Signed)
Chloe [name] is an 72 y.o. female.   Chief Complaint: Right ankle wound HPI: The patient is a 72 year old female who recently underwent ankle arthroscopy and removal of hardware.  Postoperatively she developed chronic clear drainage from the medial portal.  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  She presents now for excision of the tract and secondary closure.  Past Medical History:  Diagnosis Date  . Ankle syndesmosis disruption, right, sequela 03/13/2017  . Cervical spondylosis without myelopathy 08/10/2015  . Chronic kidney disease (CKD) 09/08/2018  . Chronic kidney disease (CKD), stage III (moderate) (HCC)   . Chronic low back pain 02/19/2017  . Chronic lower back pain   . Closed fracture dislocation of right ankle 01/07/2017  . Diabetic peripheral neuropathy associated with type 2 diabetes mellitus (HCC) 09/08/2018  . Dissection of cerebral artery (HCC) 1997   "no OR"  . Endometriosis   . Family history of adverse reaction to anesthesia    "daughter has PONV"  . Fibromyalgia   . GERD (gastroesophageal reflux disease)   . GERD without esophagitis 09/08/2018  . Headache 08/16/2013  . High cholesterol    hx  . History of hiatal hernia   . Hypertension   . Hypertensive heart disease without heart failure 09/08/2018  . Mixed hyperlipidemia 09/08/2018  . Morbid obesity (HCC) 09/08/2018  . Myalgia and myositis 08/16/2013  . Obesity   . Occipital headache    "qd" (01/10/2017)  . Orthostatic hypotension 02/02/2015  . Osteoarthritis 09/08/2018  . Plantar fasciitis   . Seasonal asthma   . Shingles   . Stomach ulcer    "I take RX for it" (01/10/2017)  . Stroke [facility]) 1997; 08/2016   mild right arm weakness  . Syncope and collapse 08/16/2013  . Syndesmotic disruption of ankle, right, sequela 03/13/2017  . Type II diabetes mellitus (HCC)   . 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 ANKLE ARTHROSCOPY Right 09/17/2018   Procedure: Right ankle arthroscopy with extensive debridement, removal of loose body;  Surgeon: Toni Arthurs, MD;  Location: [facility];  Service: Orthopedics;  Laterality: Right;  . ANKLE CLOSED REDUCTION Right 01/07/2017   Procedure: CLOSED REDUCTION ANKLE;  Surgeon: Samson Frederic, MD;  [facility];  Service: Orthopedics;  Laterality: Right;  . APPENDECTOMY    . CATARACT EXTRACTION W/ INTRAOCULAR LENS  IMPLANT, BILATERAL  03/2014 - 04/2014   left - right   . CLOSED REDUCTION NASAL FRACTURE  1990"s  . EXTERNAL FIXATION LEG Right 01/07/2017   Procedure: EXTERNAL FIXATION LEG;  Surgeon: Samson Frederic, MD;  [facility];  Service: Orthopedics;  Laterality: Right;  . FRACTURE SURGERY    . HARDWARE REMOVAL Right 09/17/2018   Procedure: Right ankle removal of deep implants tibia and fibula;  Surgeon: Toni Arthurs, MD;  Location: [facility];  Service: Orthopedics;  Laterality: Right;  . JOINT REPLACEMENT    . LAPAROSCOPIC CHOLECYSTECTOMY    . OPEN REDUCTION INTERNAL FIXATION (ORIF) TIBIA/FIBULA FRACTURE Right 01/20/2017   Procedure: OPEN REDUCTION INTERNAL FIXATION RIGHT ANKLE;  Surgeon: Samson Frederic, MD;  [facility];  Service: Orthopedics;  Laterality: Right;  . SALPINGOOPHORECTOMY  1978  . SYNDESMOSIS REPAIR Right 03/13/2017   Procedure: HARDWARE REMOVAL OF RIGHT ABKLE, OPEN REDUCTION INTERNAL FIXATION OF SYNDESMOTIC INJURY;  Surgeon: Samson Frederic, MD;  Location: [facility];  Service: Orthopedics;  Laterality: Right;  Popliteal block  . TONSILLECTOMY    .  TOTAL KNEE ARTHROPLASTY Bilateral   . TUBAL LIGATION  1978  . VAGINAL HYSTERECTOMY  1979   "partial"    Family History  Problem Relation Age of Onset  . Alzheimer's disease Mother   . CAD Mother   . Diabetes Mother   . Heart attack Mother   . Congestive Heart Failure Father   . Diabetes Father   . Diabetes Sister   . Hypertension Brother   . Diabetes Brother   . Diabetes Brother    Social  History:  reports that she quit smoking about 34 years ago. Her smoking use included cigarettes. She has a 5.50 pack-year smoking history. She has never used smokeless tobacco. She reports that she does not drink alcohol or use drugs.  Allergies:  Allergies  Allergen Reactions  . Adhesive [Tape] Hives and Rash  . Codeine Nausea Only  . Fenofibrate Other (See Comments)    Hair loss  . Simvastatin Rash    Medications Prior to Admission  Medication Sig Dispense Refill  . alendronate (FOSAMAX) 70 MG tablet Take 70 mg by mouth once a week. Take with a full glass of water on an empty stomach.    Marland Kitchen aspirin 81 MG tablet Take 81 mg by mouth daily.    . Biotin 3 MG TABS Take 300 mcg by mouth daily.     Marland Kitchen CALCIUM PO Take 1,000 mg by mouth daily.     . Cinnamon 500 MG TABS Take 1,000 mg by mouth 2 (two) times daily.     Marland Kitchen dicyclomine (BENTYL) 20 MG tablet Take 20 mg by mouth 3 (three) times daily.     Marland Kitchen docusate sodium (COLACE) 100 MG capsule Take 1 capsule (100 mg total) by mouth 2 (two) times daily. 10 capsule 0  . DULoxetine (CYMBALTA) 60 MG capsule TAKE 1 CAPSULE BY MOUTH TWICE DAILY 180 capsule 3  . furosemide (LASIX) 40 MG tablet Take 80mg s every morning and 40mg s at 4pm    . gabapentin (NEURONTIN) 400 MG capsule Take 2 caps in the morning, supper time and  Night . Take 3 caps at noon. 810 capsule 3  . insulin glargine (LANTUS) 100 UNIT/ML injection Inject into the skin. 40 units in the am    . Insulin Lispro, Human, (HUMALOG Malone) Inject into the skin. 70-150 inject 3 units, 151-200 = 5 units, 201-250 = 7 units, 151-300 = 9 units, 301-350 = 11 units, 351-400 = 13 units, >401 = 16 units and notify Dr Patient does humalog with meals only    . Methylcellulose, Laxative, (FIBER THERAPY) 500 MG TABS Take 1,000 mg by mouth daily.    . metoprolol tartrate (LOPRESSOR) 25 MG tablet Take 25 mg by mouth 2 (two) times daily.    Marland Kitchen omeprazole (PRILOSEC) 20 MG capsule Take 20 mg by mouth 2 (two) times daily.      Marland Kitchen oxyCODONE (ROXICODONE) 5 MG immediate release tablet Take 1 tablet (5 mg total) by mouth every 6 (six) hours as needed for severe pain. 20 tablet 0  . Psyllium (METAMUCIL PO) Take 2 capsules by mouth daily.    . ramipril (ALTACE) 10 MG capsule Take 10 mg by mouth 2 (two) times daily.     . rosuvastatin (CRESTOR) 5 MG tablet Take 5 mg by mouth daily.    Marland Kitchen senna (SENOKOT) 8.6 MG TABS tablet Take 1 tablet (8.6 mg total) by mouth 2 (two) times daily. (Patient taking differently: Take 1 tablet by mouth at bedtime. ) 120 each  0  . traMADol (ULTRAM) 50 MG tablet tramadol 50 mg tablet    . Turmeric 500 MG TABS Take 1,000 mg by mouth daily.    . Vitamin D, Cholecalciferol, 1000 units CAPS Take 1 Units by mouth 3 (three) times daily.     . vitamin E 400 UNIT capsule Take 400 Units by mouth daily.    Marland Kitchen zolpidem (AMBIEN) 10 MG tablet Take 1 tablet (10 mg total) by mouth at bedtime as needed for sleep. 30 tablet 4    No results found for this or any previous visit (from the past 48 hour(s)). No results found.  ROS no recent fever, chills, nausea, vomiting or changes in her appetite  Height 5\' 3"  (1.6 m), weight 121 kg. Physical Exam  Well-nourished well-developed woman in no apparent distress.  Alert and oriented x4.  Mood and affect are normal.  Extraocular motions are intact.  Respirations are unlabored.  Gait is normal.  The right ankle has a healed anterolateral arthroscopy portal.  Medial and lateral ankle incisions have healed as well.  The anteromedial portal has some crust over it suggestive of recent drainage.  I am unable to milk any clear fluid out this morning.  No sign of any infection.  No significant swelling or effusion.  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  The risks and benefits of the alternative treatment options have been discussed in detail.  The patient wishes to proceed  with surgery and specifically understands risks of bleeding, infection, nerve damage, blood clots, need for additional surgery, amputation and death.   Toni Arthurs, MD November 10, 2018, 9:53 AM

## 2018-10-22 NOTE — Anesthesia Procedure Notes (Addendum)
Procedure Name: LMA Insertion Date/Time: 10/22/2018 10:18 AM Performed by: Ronnette Hila, CRNA Pre-anesthesia Checklist: Patient identified, Emergency Drugs available, Suction available and Patient being monitored Patient Re-evaluated:Patient Re-evaluated prior to induction Oxygen Delivery Method: Circle system utilized Preoxygenation: Pre-oxygenation with 100% oxygen Induction Type: IV induction Ventilation: Mask ventilation without difficulty LMA: LMA inserted LMA Size: 4.0 Number of attempts: 1 Airway Equipment and Method: Bite block Placement Confirmation: positive ETCO2 Tube secured with: Tape Dental Injury: Teeth and Oropharynx as per pre-operative assessment

## 2018-10-22 NOTE — Discharge Instructions (Addendum)
Toni Arthurs, MD [facility]  Please read the following information regarding your care after surgery.  Medications  You only need a prescription for the narcotic pain medicine (ex. oxycodone, Percocet, Norco).  All of the other medicines listed below are available over the counter. X Aleve 2 pills twice a day for the first 3 days after surgery. X acetominophen (Tylenol) 650 mg every 4-6 hours as you need for minor to moderate pain X resume tramadol as needed for severe pain  Weight Bearing X Bear weight when you are able on your operated leg or foot.   Cast / Splint / Dressing X Keep your splint, cast or dressing clean and dry.  Dont put anything (coat hanger, pencil, etc) down inside of it.  If it gets damp, use a hair dryer on the cool setting to dry it.  If it gets soaked, call the office to schedule an appointment for a cast change.   After your dressing, cast or splint is removed; you may shower, but do not soak or scrub the wound.  Allow the water to run over it, and then gently pat it dry.  Swelling It is normal for you to have swelling where you had surgery.  To reduce swelling and pain, keep your toes above your nose for at least 3 days after surgery.  It may be necessary to keep your foot or leg elevated for several weeks.  If it hurts, it should be elevated.  Follow Up Call my office at [phone] when you are discharged from the hospital or surgery center to schedule an appointment to be seen two weeks after surgery.  Call my office at [phone] if you develop a fever >101.5 F, nausea, vomiting, bleeding from the surgical site or severe pain.        Post Anesthesia Home Care Instructions  Activity: Get plenty of rest for the remainder of the day. A responsible individual must stay with you for 24 hours following the procedure.  For the next 24 hours, DO NOT: -Drive a car -Advertising copywriter -Drink alcoholic beverages -Take any medication unless  instructed by your physician -Make any legal decisions or sign important papers.  Meals: Start with liquid foods such as gelatin or soup. Progress to regular foods as tolerated. Avoid greasy, spicy, heavy foods. If nausea and/or vomiting occur, drink only clear liquids until the nausea and/or vomiting subsides. Call your physician if vomiting continues.  Special Instructions/Symptoms: Your throat may feel dry or sore from the anesthesia or the breathing tube placed in your throat during surgery. If this causes discomfort, gargle with warm salt water. The discomfort should disappear within 24 hours.  If you had a scopolamine patch placed behind your ear for the management of post- operative nausea and/or vomiting:  1. The medication in the patch is effective for 72 hours, after which it should be removed.  Wrap patch in a tissue and discard in the trash. Wash hands thoroughly with soap and water. 2. You may remove the patch earlier than 72 hours if you experience unpleasant side effects which may include dry mouth, dizziness or visual disturbances. 3. Avoid touching the patch. Wash your hands with soap and water after contact with the patch.

## 2018-10-23 ENCOUNTER — Encounter (HOSPITAL_BASED_OUTPATIENT_CLINIC_OR_DEPARTMENT_OTHER): Payer: Self-pay | Admitting: Orthopedic Surgery

## 2019-01-08 ENCOUNTER — Other Ambulatory Visit: Payer: Self-pay | Admitting: Endocrinology

## 2019-01-08 ENCOUNTER — Other Ambulatory Visit (HOSPITAL_COMMUNITY): Payer: Self-pay | Admitting: Endocrinology

## 2019-01-08 DIAGNOSIS — E041 Nontoxic single thyroid nodule: Secondary | ICD-10-CM

## 2019-01-22 ENCOUNTER — Other Ambulatory Visit: Payer: Self-pay | Admitting: Neurology

## 2019-01-25 ENCOUNTER — Encounter (HOSPITAL_COMMUNITY)
Admission: RE | Admit: 2019-01-25 | Discharge: 2019-01-25 | Disposition: A | Discharge status: Home / Self Care | Payer: Medicare Other | Source: Ambulatory Visit | Attending: Endocrinology | Admitting: Endocrinology

## 2019-01-25 DIAGNOSIS — E041 Nontoxic single thyroid nodule: Secondary | ICD-10-CM | POA: Insufficient documentation

## 2019-01-25 MED ORDER — SODIUM IODIDE I-123 7.4 MBQ CAPS
418.0000 | ORAL_CAPSULE | Freq: Once | ORAL | Status: AC
  Administered 2019-01-25: 418 via ORAL

## 2019-01-26 ENCOUNTER — Encounter (HOSPITAL_COMMUNITY)
Admission: RE | Admit: 2019-01-26 | Discharge: 2019-01-26 | Disposition: A | Discharge status: Home / Self Care | Payer: Medicare Other | Source: Ambulatory Visit | Attending: Endocrinology | Admitting: Endocrinology

## 2019-02-20 ENCOUNTER — Other Ambulatory Visit: Payer: Self-pay | Admitting: Neurology

## 2019-05-10 ENCOUNTER — Telehealth: Payer: Self-pay | Admitting: Neurology

## 2019-05-10 NOTE — Telephone Encounter (Signed)
Pt called re: her upcoming 6 month follow up.  Pt wanted to know if Dr Anne Hahn wanted her to come into the office for her 6 month f/u or if she needed to have a vv.  RN Aundra Millet confirmed that pt needed to be scheduled for an in office 6 mo f/u.  Phone rep asked RN about available dates to choose from. The double booking of 06-03 was mentioned. Phone rep was advised by RN that as date gets closer it may have to change.  This message was relayed to pt.  As it stands pt is aware that for the 12 noon appointment she will need to check in at 11:30. No call back has been requested.

## 2019-05-10 NOTE — Telephone Encounter (Signed)
Noted  

## 2019-05-11 ENCOUNTER — Ambulatory Visit: Payer: Medicare Other | Admitting: Neurology

## 2019-05-24 HISTORY — PX: OTHER SURGICAL HISTORY: SHX169

## 2019-05-26 ENCOUNTER — Other Ambulatory Visit (HOSPITAL_COMMUNITY): Payer: Self-pay | Admitting: Endocrinology

## 2019-05-26 ENCOUNTER — Encounter: Payer: Self-pay | Admitting: Neurology

## 2019-05-26 ENCOUNTER — Ambulatory Visit (INDEPENDENT_AMBULATORY_CARE_PROVIDER_SITE_OTHER): Payer: Medicare Other | Admitting: Neurology

## 2019-05-26 VITALS — BP 98/49 | HR 63 | Temp 97.8°F | Ht 63.0 in | Wt 269.3 lb

## 2019-05-26 DIAGNOSIS — R51 Headache: Secondary | ICD-10-CM | POA: Diagnosis not present

## 2019-05-26 DIAGNOSIS — E1142 Type 2 diabetes mellitus with diabetic polyneuropathy: Secondary | ICD-10-CM | POA: Diagnosis not present

## 2019-05-26 DIAGNOSIS — M545 Low back pain, unspecified: Secondary | ICD-10-CM

## 2019-05-26 DIAGNOSIS — G8929 Other chronic pain: Secondary | ICD-10-CM

## 2019-05-26 DIAGNOSIS — G4486 Cervicogenic headache: Secondary | ICD-10-CM

## 2019-05-26 DIAGNOSIS — E0590 Thyrotoxicosis, unspecified without thyrotoxic crisis or storm: Secondary | ICD-10-CM

## 2019-05-26 MED ORDER — GABAPENTIN 400 MG PO CAPS: ORAL_CAPSULE | ORAL | 3 refills | Status: DC

## 2019-05-26 MED ORDER — TIZANIDINE HCL 2 MG PO TABS: 2.0000 mg | ORAL_TABLET | Freq: Three times a day (TID) | ORAL | 2 refills | Status: DC

## 2019-05-26 MED ORDER — DULOXETINE HCL 60 MG PO CPEP: 60.0000 mg | ORAL_CAPSULE | Freq: Two times a day (BID) | ORAL | 3 refills | Status: DC

## 2019-05-26 NOTE — Patient Instructions (Signed)
We will start tizanidine 2 mg three times a day, and we will start neuromuscular therapy for the neck and shoulder.

## 2019-05-26 NOTE — Progress Notes (Signed)
Reason for visit: Fibromyalgia, headache  Chloe [name] is an 73 y.o. female  History of present illness:  Chloe [name] is a 73 year old right-handed white female with a history of morbid obesity, fibromyalgia, chronic low back pain, right hip pain, left knee discomfort and right ankle pain.  The patient is on maximum doses of Cymbalta and gabapentin.  She is using a cane for ambulation, she is still having ongoing right ankle and foot pain following surgery 6 months ago.  The patient comes back in today with a new problem.  She has developed some pain and discomfort in the neck and shoulders and some discomfort in the back of the head, she may have occasional sharp jabbing pains into the back of the ear on the left side.  She has discomfort when she turns her head from one side to the next.  She denies any pain going down the arms to the hands.  She reports no recent falls, she has not had any change in sensation or strength.  Past Medical History:  Diagnosis Date  . Ankle syndesmosis disruption, right, sequela 03/13/2017  . Cervical spondylosis without myelopathy 08/10/2015  . Chronic kidney disease (CKD) 09/08/2018  . Chronic kidney disease (CKD), stage III (moderate) (HCC)   . Chronic low back pain 02/19/2017  . Chronic lower back pain   . Closed fracture dislocation of right ankle 01/07/2017  . Diabetic peripheral neuropathy associated with type 2 diabetes mellitus (HCC) 09/08/2018  . Dissection of cerebral artery (HCC) 1997   "no OR"  . Endometriosis   . Family history of adverse reaction to anesthesia    "daughter has PONV"  . Fibromyalgia   . GERD (gastroesophageal reflux disease)   . GERD without esophagitis 09/08/2018  . Headache 08/16/2013  . High cholesterol    hx  . History of hiatal hernia   . Hypertension   . Hypertensive heart disease without heart failure 09/08/2018  . Mixed hyperlipidemia 09/08/2018  . Morbid obesity (HCC) 09/08/2018  . Myalgia and myositis 08/16/2013   . Obesity   . Occipital headache    "qd" (01/10/2017)  . Orthostatic hypotension 02/02/2015  . Osteoarthritis 09/08/2018  . Plantar fasciitis   . Seasonal asthma   . Shingles   . Stomach ulcer    "I take RX for it" (01/10/2017)  . Stroke [facility]) 1997; 08/2016   mild right arm weakness  . Syncope and collapse 08/16/2013  . Syndesmotic disruption of ankle, right, sequela 03/13/2017  . Type II diabetes mellitus (HCC)   . 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 ANKLE ARTHROSCOPY Right 09/17/2018   Procedure: Right ankle arthroscopy with extensive debridement, removal of loose body;  Surgeon: Toni Arthurs, MD;  Location: [facility];  Service: Orthopedics;  Laterality: Right;  . ANKLE CLOSED REDUCTION Right 01/07/2017   Procedure: CLOSED REDUCTION ANKLE;  Surgeon: Samson Frederic, MD;  Location: [facility];  Service: Orthopedics;  Laterality: Right;  . APPENDECTOMY    . CATARACT EXTRACTION W/ INTRAOCULAR LENS  IMPLANT, BILATERAL  03/2014 - 04/2014   left - right   . CLOSED REDUCTION NASAL FRACTURE  1990"s  . EXTERNAL FIXATION LEG Right 01/07/2017   Procedure: EXTERNAL FIXATION LEG;  Surgeon: Samson Frederic, MD;  Location: [facility] OR;  Service: Orthopedics;  Laterality: Right;  . FRACTURE SURGERY    . HARDWARE REMOVAL Right 09/17/2018   Procedure: Right ankle removal  of deep implants tibia and fibula;  Surgeon: Toni ArthursHewitt, John, MD;  Location: [facility];  Service: Orthopedics;  Laterality: Right;  . I&D EXTREMITY Right 10/22/2018   Procedure: Irrigation and debridement and closure of right ankle wound;  Surgeon: Toni ArthursHewitt, John, MD;  Location: [facility];  Service: Orthopedics;  Laterality: Right;  . JOINT REPLACEMENT    . LAPAROSCOPIC CHOLECYSTECTOMY    . OPEN REDUCTION INTERNAL FIXATION (ORIF) TIBIA/FIBULA FRACTURE Right 01/20/2017   Procedure: OPEN REDUCTION INTERNAL FIXATION RIGHT ANKLE;  Surgeon: Samson FredericBrian  Swinteck, MD;  Location: [facility];  Service: Orthopedics;  Laterality: Right;  . SALPINGOOPHORECTOMY  1978  . SYNDESMOSIS REPAIR Right 03/13/2017   Procedure: HARDWARE REMOVAL OF RIGHT ABKLE, OPEN REDUCTION INTERNAL FIXATION OF SYNDESMOTIC INJURY;  Surgeon: Samson FredericBrian Swinteck, MD;  Location: [facility];  Service: Orthopedics;  Laterality: Right;  Popliteal block  . TONSILLECTOMY    . TOTAL KNEE ARTHROPLASTY Bilateral   . TUBAL LIGATION  1978  . VAGINAL HYSTERECTOMY  1979   "partial"    Family History  Problem Relation Age of Onset  . Alzheimer's disease Mother   . CAD Mother   . Diabetes Mother   . Heart attack Mother   . Congestive Heart Failure Father   . Diabetes Father   . Diabetes Sister   . Hypertension Brother   . Diabetes Brother   . Diabetes Brother     Social history:  reports that she quit smoking about 35 years ago. Her smoking use included cigarettes. She has a 5.50 pack-year smoking history. She has never used smokeless tobacco. She reports that she does not drink alcohol or use drugs.    Allergies  Allergen Reactions  . Adhesive [Tape] Hives and Rash  . Codeine Nausea Only  . Fenofibrate Other (See Comments)    Hair loss  . Simvastatin Rash    Medications:  Prior to Admission medications   Medication Sig Start Date End Date Taking? Authorizing Provider  alendronate (FOSAMAX) 70 MG tablet Take 70 mg by mouth once a week. Take with a full glass of water on an empty stomach.   Yes [provider]  aspirin 81 MG tablet Take 81 mg by mouth daily.   Yes [provider]  Biotin 3 MG TABS Take 300 mcg by mouth daily.    Yes [provider]  CALCIUM PO Take 1,000 mg by mouth daily.    Yes [provider]  Cinnamon 500 MG TABS Take 1,000 mg by mouth 2 (two) times daily.    Yes [provider]  dicyclomine (BENTYL) 20 MG tablet Take 20 mg by mouth 3 (three) times daily.    Yes [provider]  docusate sodium (COLACE) 100 MG  capsule Take 1 capsule (100 mg total) by mouth 2 (two) times daily. 01/08/17  Yes Swinteck, Arlys JohnBrian, MD  DULoxetine (CYMBALTA) 60 MG capsule Take 1 capsule (60 mg total) by mouth 2 (two) times daily. 05/26/19  Yes York SpanielWillis, Liyana Suniga K, MD  furosemide (LASIX) 40 MG tablet Take 80mg s every morning and 40mg s at 4pm   Yes [provider]  gabapentin (NEURONTIN) 400 MG capsule TAKE 2 CAPSULES BY MOUTH IN THE MORNING THEN 2 WITH SUPPER AND AT NIGHT  AND 3 AT NOON 05/26/19  Yes York SpanielWillis, Jasleen Riepe K, MD  insulin glargine (LANTUS) 100 UNIT/ML injection Inject into the skin. 40 units in the am   Yes [provider]  Methylcellulose, Laxative, (FIBER THERAPY) 500 MG TABS Take  1,000 mg by mouth daily.   Yes [provider]  metoprolol tartrate (LOPRESSOR) 25 MG tablet Take 25 mg by mouth 2 (two) times daily.   Yes [provider]  omeprazole (PRILOSEC) 20 MG capsule Take 20 mg by mouth 2 (two) times daily.    Yes [provider]  Psyllium (METAMUCIL PO) Take 2 capsules by mouth daily.   Yes [provider]  ramipril (ALTACE) 10 MG capsule Take 10 mg by mouth 2 (two) times daily.    Yes [provider]  rosuvastatin (CRESTOR) 5 MG tablet Take 5 mg by mouth daily.   Yes [provider]  senna (SENOKOT) 8.6 MG TABS tablet Take 1 tablet (8.6 mg total) by mouth 2 (two) times daily. Patient taking differently: Take 1 tablet by mouth at bedtime.  01/08/17  Yes Swinteck, Arlys John, MD  traMADol (ULTRAM) 50 MG tablet tramadol 50 mg tablet   Yes [provider]  Turmeric 500 MG TABS Take 1,000 mg by mouth daily.   Yes [provider]  Vitamin D, Cholecalciferol, 1000 units CAPS Take 1 Units by mouth 3 (three) times daily.    Yes [provider]  vitamin E 400 UNIT capsule Take 400 Units by mouth daily.   Yes [provider]  tiZANidine (ZANAFLEX) 2 MG tablet Take 1 tablet (2 mg total) by mouth 3 (three) times daily. 05/26/19   York Spaniel, MD  zolpidem (AMBIEN) 10 MG tablet Take 1 tablet (10 mg total) by mouth at bedtime as needed for sleep. 10/20/18 11/19/18  York Spaniel, MD    ROS:  Out of a complete 14 system review of symptoms, the patient complains only of the following symptoms, and all other reviewed systems are negative.  Joint pain, leg swelling Headache Left ear pain  Blood pressure (!) 98/49, pulse 63, temperature 97.8 F (36.6 C), temperature source Oral, height  (1.6 m), weight 269 lb 5 oz (122.2 kg).  Physical Exam  General: The patient is alert and cooperative at the time of the examination.  The patient is markedly obese.  Ears: Tympanic membranes are clear bilaterally.  Neuromuscular: The patient lacks about 20 degrees of full lateral Tatian cervical spine bilaterally, she reports discomfort with this.  Skin: 2-3+ edema below the knees is seen bilaterally.   Neurologic Exam  Mental status: The patient is alert and oriented x 3 at the time of the examination. The patient has apparent normal recent and remote memory, with an apparently normal attention span and concentration ability.   Cranial nerves: Facial symmetry is present. Speech is normal, no aphasia or dysarthria is noted. Extraocular movements are full. Visual fields are full.  Motor: The patient has good strength in all 4 extremities.  Sensory examination: Soft touch sensation is symmetric on the face, arms, and legs.  Coordination: The patient has good finger-nose-finger and heel-to-shin bilaterally.  Gait and station: The patient has a wide-based gait, she has a limping gait on the right leg.  She usually uses a cane for ambulation.  Romberg is negative, tandem gait was not attempted.  Reflexes: Deep tendon reflexes are symmetric.   Assessment/Plan:  1.  Fibromyalgia  2.  Chronic low back pain  3.  Neck pain, cervicogenic headache  4.  Left ear lancinating pain, possible lesser branch occipital  neuralgia  The patient is having some issues with neck pain and stiffness, pain in the back of the head and occasional jabbing pain in the  left ear.  The patient could have an incomplete occipital neuralgia mainly affecting the lesser branch.  The patient will be placed on tizanidine and she was set up for neuromuscular therapy.  She will follow-up in about 6 months, sooner if needed.  She will call if she is not getting any benefit with the treatment for the headache.  Marlan Palau MD 05/26/2019 12:20 PM  [facility] [address] [place], [place] [address]  Phone [phone] Fax [phone]

## 2019-06-03 ENCOUNTER — Other Ambulatory Visit: Payer: Self-pay

## 2019-06-03 ENCOUNTER — Encounter (HOSPITAL_COMMUNITY)
Admission: RE | Admit: 2019-06-03 | Discharge: 2019-06-03 | Disposition: A | Discharge status: Home / Self Care | Payer: Medicare Other | Source: Ambulatory Visit | Attending: Endocrinology | Admitting: Endocrinology

## 2019-06-03 DIAGNOSIS — E0590 Thyrotoxicosis, unspecified without thyrotoxic crisis or storm: Secondary | ICD-10-CM | POA: Diagnosis present

## 2019-06-03 MED ORDER — SODIUM IODIDE I 131 CAPSULE
25.0000 | Freq: Once | INTRAVENOUS | Status: AC | PRN
  Administered 2019-06-03: 25 via ORAL

## 2019-07-27 NOTE — Progress Notes (Signed)
Cardiology Office Note:    Date:  07/28/2019   ID:  Chloe [name], DOB 12/15/1946, MRN [id number]  [facility]  [facility]  Cardiologist:  Norman HerrlichBrian , MD    Referring MD: [facility]    ASSESSMENT:    1. Edema of both lower extremities   2. Hypertensive heart and kidney disease without heart failure   3. Dyspnea, unspecified type   4. RBBB    PLAN:    In order of problems listed above:  1. Hypertensive heart and kidney disease without heart failure -clinical problem that brings her here today is lower extremity edema I suspect that gabapentin is a culprit offered to switch her to torsemide to mitigate symptoms she declines.  It is conceivable that she has heart failure and for further evaluation a proBNP level echocardiogram to look carefully at pulmonary pressures LV systolic and diastolic function.  She lives alone but has no history of snoring or excessive daytime sleeping and I do not think a sleep test would be beneficial.  HTN -stable BP at target  CKDIII - Labs 05/14/19 with creatinine 1.46, GFR 36 managed by nephrology 2. RBBB -stable EKG pattern 3. HLD - Last lipid profile 08/2018 with HDL 42, LDL 72, Total cholesterol 409161, Triglycerides 235.  Managed by endocrine 4. DM2 - A1c with PCP 05/14/19 was 6.1.  Managed by endocrinology   Next appointment: 6 weeks   Medication Adjustments/Labs and Tests Ordered: Current medicines are reviewed at length with the patient today.  Concerns regarding medicines are outlined above.  Orders Placed This Encounter  Procedures  . Pro b natriuretic peptide  . EKG 12-Lead  . ECHOCARDIOGRAM COMPLETE   No orders of the defined types were placed in this encounter.  Chief Complaint  Patient presents with  . Follow-up    History of Present Illness:    Chloe [name] is a 73 y.o. female with a hx of RBBB, HTN with stage III CKD, HLD, DM2 with peripheral neuropathy, chronic vertebral artery dissection, fibromyalgia  [facility]  last seen 09/11/2018. Additional medical history includes chronic low back pain, multiple orthopedic problems including R ankle arthroscopy requiring removal of hardware and arthro-cutaneous fistula.   Preop Echo 09/14/2018:    - Left ventricle: The cavity size was normal. Systolic function was   normal. The estimated ejection fraction was in the range of 55%   to 60%. Wall motion was normal; there were no regional wall   motion abnormalities. Left ventricular diastolic function   parameters were normal. - Mitral valve: There was mild regurgitation. - Atrial septum: No defect or patent foramen ovale was identified. - Tricuspid valve: There was moderate regurgitation. - Pulmonary arteries: PA peak pressure: 43 mm Hg (S).  Compliance with diet, lifestyle and medications: Yes  Her reason for being seen today is increasing edema she tells me she was referred by her PCP.  She restrict dietary sodium thinks her diuretic but unfortunately takes gabapentin that can cause profound sodium retention she is noticed lower extremity as well as upper extremity edema and is concerned.  She is not having orthopnea shortness of breath inside the home palpitation or syncope she has had chest discomfort that is momentary and fleeting no anginal discomfort.  I offered to switch her to torsemide but she did not want me adjusting her diuretics with her CKD and will speak to her nephrologist. Past Medical History:  Diagnosis Date  . Ankle syndesmosis disruption, right,  sequela 03/13/2017  . Cervical spondylosis without myelopathy 08/10/2015  . Chronic kidney disease (CKD) 09/08/2018  . Chronic kidney disease (CKD), stage III (moderate) (HCC)   . Chronic low back pain 02/19/2017  . Chronic lower back pain   . Closed fracture dislocation of right ankle 01/07/2017  . Diabetic peripheral neuropathy associated with type 2 diabetes mellitus (HCC) 09/08/2018  . Dissection of cerebral artery  (HCC) 1997   "no OR"  . Endometriosis   . Family history of adverse reaction to anesthesia    "daughter has PONV"  . Fibromyalgia   . GERD (gastroesophageal reflux disease)   . GERD without esophagitis 09/08/2018  . Headache 08/16/2013  . High cholesterol    hx  . History of hiatal hernia   . Hypertension   . Hypertensive heart disease without heart failure 09/08/2018  . Mixed hyperlipidemia 09/08/2018  . Morbid obesity (HCC) 09/08/2018  . Myalgia and myositis 08/16/2013  . Obesity   . Occipital headache    "qd" (01/10/2017)  . Orthostatic hypotension 02/02/2015  . Osteoarthritis 09/08/2018  . Plantar fasciitis   . Seasonal asthma   . Shingles   . Stomach ulcer    "I take RX for it" (01/10/2017)  . Stroke [facility](HCC) 1997; 08/2016   mild right arm weakness  . Syncope and collapse 08/16/2013  . Syndesmotic disruption of ankle, right, sequela 03/13/2017  . Type II diabetes mellitus (HCC)   . Vertebral artery dissection [facility](HCC)    right     Past Surgical History:  Procedure Laterality Date  . ANKLE ARTHROSCOPY Right 09/17/2018   Procedure: Right ankle arthroscopy with extensive debridement, removal of loose body;  Surgeon: Toni ArthursHewitt, John, MD;  Location: [facility];  Service: Orthopedics;  Laterality: Right;  . ANKLE CLOSED REDUCTION Right 01/07/2017   Procedure: CLOSED REDUCTION ANKLE;  Surgeon: Samson FredericBrian Swinteck, MD;  Location: [facility];  Service: Orthopedics;  Laterality: Right;  . APPENDECTOMY    . CATARACT EXTRACTION W/ INTRAOCULAR LENS  IMPLANT, BILATERAL  03/2014 - 04/2014   left - right   . CLOSED REDUCTION NASAL FRACTURE  1990"s  . EXTERNAL FIXATION LEG Right 01/07/2017   Procedure: EXTERNAL FIXATION LEG;  Surgeon: Samson FredericBrian Swinteck, MD;  Location: [facility] OR;  Service: Orthopedics;  Laterality: Right;  . FRACTURE SURGERY    . HARDWARE REMOVAL Right 09/17/2018   Procedure: Right ankle removal of deep implants tibia and fibula;  Surgeon: Toni ArthursHewitt, John, MD;  Location: [facility];  Service: Orthopedics;  Laterality: Right;  . I&D EXTREMITY Right 10/22/2018   Procedure: Irrigation and debridement and closure of right ankle wound;  Surgeon: Toni ArthursHewitt, John, MD;  Location:  SURGERY CENTER;  Service: Orthopedics;  Laterality: Right;  . JOINT REPLACEMENT    . LAPAROSCOPIC CHOLECYSTECTOMY    . OPEN REDUCTION INTERNAL FIXATION (ORIF) TIBIA/FIBULA FRACTURE Right 01/20/2017   Procedure: OPEN REDUCTION INTERNAL FIXATION RIGHT ANKLE;  Surgeon: Samson FredericBrian Swinteck, MD;  Location: [facility] OR;  Service: Orthopedics;  Laterality: Right;  . radioactive iodine  05/2019   for hyperthyroidism  . SALPINGOOPHORECTOMY  1978  . SYNDESMOSIS REPAIR Right 03/13/2017   Procedure: HARDWARE REMOVAL OF RIGHT ABKLE, OPEN REDUCTION INTERNAL FIXATION OF SYNDESMOTIC INJURY;  Surgeon: Samson FredericBrian Swinteck, MD;  Location: [facility];  Service: Orthopedics;  Laterality: Right;  Popliteal block  . thyroid ablation  05/2019   for overactive thyroid  . TONSILLECTOMY    . TOTAL KNEE ARTHROPLASTY Bilateral   . TUBAL LIGATION  1978  .  VAGINAL HYSTERECTOMY  1979   "partial"    Current Medications: Current Meds  Medication Sig  . alendronate (FOSAMAX) 70 MG tablet Take 70 mg by mouth once a week. Take with a full glass of water on an empty stomach.  Marland Kitchen aspirin 81 MG tablet Take 81 mg by mouth daily.  . Biotin 3 MG TABS Take 300 mcg by mouth daily.   Marland Kitchen CALCIUM PO Take 1,000 mg by mouth daily.   . Cinnamon 500 MG TABS Take 1,000 mg by mouth 2 (two) times daily.   Marland Kitchen dicyclomine (BENTYL) 20 MG tablet Take 20 mg by mouth 3 (three) times daily.   Marland Kitchen docusate sodium (COLACE) 100 MG capsule Take 1 capsule (100 mg total) by mouth 2 (two) times daily.  . DULoxetine (CYMBALTA) 60 MG capsule Take 1 capsule (60 mg total) by mouth 2 (two) times daily.  . furosemide (LASIX) 40 MG tablet Take 80mg s every morning and 40mg s at 4pm  . gabapentin (NEURONTIN) 400 MG capsule TAKE 2 CAPSULES BY MOUTH IN THE MORNING THEN 2 WITH SUPPER  AND AT NIGHT  AND 3 AT NOON  . insulin glargine (LANTUS) 100 UNIT/ML injection Inject into the skin. 40 units in the am  . Methylcellulose, Laxative, (FIBER THERAPY) 500 MG TABS Take 1,000 mg by mouth daily.  . metoprolol tartrate (LOPRESSOR) 25 MG tablet Take 25 mg by mouth 2 (two) times daily.  Marland Kitchen omeprazole (PRILOSEC) 20 MG capsule Take 20 mg by mouth 2 (two) times daily.   . Psyllium (METAMUCIL PO) Take 2 capsules by mouth daily.  . ramipril (ALTACE) 10 MG capsule Take 10 mg by mouth 2 (two) times daily.   . rosuvastatin (CRESTOR) 5 MG tablet Take 5 mg by mouth daily.  Marland Kitchen senna (SENOKOT) 8.6 MG TABS tablet Take 1 tablet (8.6 mg total) by mouth 2 (two) times daily. (Patient taking differently: Take 1 tablet by mouth at bedtime. )  . tiZANidine (ZANAFLEX) 2 MG tablet Take 1 tablet (2 mg total) by mouth 3 (three) times daily.  . traMADol (ULTRAM) 50 MG tablet tramadol 50 mg tablet  . Turmeric 500 MG TABS Take 1,000 mg by mouth daily.  . Vitamin D, Cholecalciferol, 1000 units CAPS Take 1 Units by mouth 3 (three) times daily.   . vitamin E 400 UNIT capsule Take 400 Units by mouth daily.     Allergies:   Adhesive [tape], Codeine, Fenofibrate, and Simvastatin   Social History   Socioeconomic History  . Marital status: Widowed    Spouse name: Merry Proud  . Number of children: 4  . Years of education: 13+  . Highest education level: Not on file  Occupational History  . Occupation: Retired   Scientific laboratory technician  . Financial resource strain: Not on file  . Food insecurity    Worry: Not on file    Inability: Not on file  . Transportation needs    Medical: Not on file    Non-medical: Not on file  Tobacco Use  . Smoking status: Former Smoker    Packs/day: 0.50    Years: 11.00    Pack years: 5.50    Types: Cigarettes    Quit date: 1985    Years since quitting: 35.6  . Smokeless tobacco: Never Used  Substance and Sexual Activity  . Alcohol use: No  . Drug use: No  . Sexual activity: Never   Lifestyle  . Physical activity    Days per week: Not on file  Minutes per session: Not on file  . Stress: Not on file  Relationships  . Social Musicianconnections    Talks on phone: Not on file    Gets together: Not on file    Attends religious service: Not on file    Active member of club or organization: Not on file    Attends meetings of clubs or organizations: Not on file    Relationship status: Not on file  Other Topics Concern  . Not on file  Social History Narrative   Patient lives at home with her husband.    Patient is a retired Architectural technologistteacher's assistant.    Patient has a high school education and some college.    Patient has four children.   Patient is right handed.   Patient drinks about 2 cups caffeine daily.     Family History: The patient's family history includes Alzheimer's disease in her mother; CAD in her mother; Congestive Heart Failure in her father; Diabetes in her brother, brother, father, mother, and sister; Heart attack in her mother; Hypertension in her brother. ROS:   Please see the history of present illness.    All other systems reviewed and are negative.  EKGs/Labs/Other Studies Reviewed:    The following studies were reviewed today:  Preop Echo 09/14/2018:    - Left ventricle: The cavity size was normal. Systolic function was   normal. The estimated ejection fraction was in the range of 55%   to 60%. Wall motion was normal; there were no regional wall   motion abnormalities. Left ventricular diastolic function   parameters were normal. - Mitral valve: There was mild regurgitation. - Atrial septum: No defect or patent foramen ovale was identified. - Tricuspid valve: There was moderate regurgitation. - Pulmonary arteries: PA peak pressure: 43 mm Hg (S).  EKG:  EKG ordered today and personally reviewed.  The ekg ordered today demonstrates sinus rhythm right bundle branch block  Recent Labs: 05/14/19 via [facility]: A1c 6.1, creatinine 1.46, GFR 36 10/22/2018:  BUN 17; Creatinine, Ser 1.50; Hemoglobin 11.6; Potassium 3.6; Sodium 142  Recent Lipid Panel 08/2018 via [facility]: total 161, triglycerides 235, DHS 42, LDL 72  Physical Exam:    VS:  BP 134/66 (BP Location: Left Arm, Patient Position: Sitting, Cuff Size: Normal)   Pulse 71   Temp 98.3 F (36.8 C)   Ht 5\' 3"  (1.6 m)   Wt 268 lb 9.6 oz (121.8 kg)   SpO2 96%   BMI 47.58 kg/m     Wt Readings from Last 3 Encounters:  07/28/19 268 lb 9.6 oz (121.8 kg)  05/26/19 269 lb 5 oz (122.2 kg)  10/22/18 269 lb 2.9 oz (122.1 kg)     GEN: Marked obesity BMI 47.5 well nourished, well developed in no acute distress HEENT: Normal NECK: No JVD; No carotid bruits LYMPHATICS: No lymphadenopathy CARDIAC: RRR, no murmurs, rubs, gallops RESPIRATORY:  Clear to auscultation without rales, wheezing or rhonchi  ABDOMEN: Soft, non-tender, non-distended MUSCULOSKELETAL: Nonpitting lower extremity bilateral edema; No deformity  SKIN: Warm and dry NEUROLOGIC:  Alert and oriented x 3 PSYCHIATRIC:  Normal affect    Signed, Norman HerrlichBrian , MD  07/28/2019 4:42 PM    [facility]

## 2019-07-28 ENCOUNTER — Encounter: Payer: Self-pay | Admitting: Cardiology

## 2019-07-28 ENCOUNTER — Ambulatory Visit (INDEPENDENT_AMBULATORY_CARE_PROVIDER_SITE_OTHER): Payer: Medicare Other | Admitting: Cardiology

## 2019-07-28 ENCOUNTER — Other Ambulatory Visit: Payer: Self-pay

## 2019-07-28 VITALS — BP 134/66 | HR 71 | Temp 98.3°F | Ht 63.0 in | Wt 268.6 lb

## 2019-07-28 DIAGNOSIS — I1310 Hypertensive heart and chronic kidney disease without heart failure, with stage 1 through stage 4 chronic kidney disease, or unspecified chronic kidney disease: Secondary | ICD-10-CM | POA: Diagnosis not present

## 2019-07-28 DIAGNOSIS — R600 Localized edema: Secondary | ICD-10-CM | POA: Diagnosis not present

## 2019-07-28 DIAGNOSIS — I4510 Unspecified right bundle-branch block: Secondary | ICD-10-CM

## 2019-07-28 DIAGNOSIS — R0600 Dyspnea, unspecified: Secondary | ICD-10-CM

## 2019-07-28 NOTE — Patient Instructions (Signed)
Medication Instructions:  Your physician recommends that you continue on your current medications as directed. Please refer to the Current Medication list given to you today.  If you need a refill on your cardiac medications before your next appointment, please call your pharmacy.   Lab work: Your physician recommends that you return for lab work in: TODAY Pro BNP  If you have labs (blood work) drawn today and your tests are completely normal, you will receive your results only by: Marland Kitchen MyChart Message (if you have MyChart) OR . A paper copy in the mail If you have any lab test that is abnormal or we need to change your treatment, we will call you to review the results.  Testing/Procedures: Your physician has requested that you have an echocardiogram. Echocardiography is a painless test that uses sound waves to create images of your heart. It provides your doctor with information about the size and shape of your heart and how well your heart's chambers and valves are working. This procedure takes approximately one hour. There are no restrictions for this procedure.   Follow-Up: At [facility], you and your health needs are our priority.  As part of our continuing mission to provide you with exceptional heart care, we have created designated Provider Care Teams.  These Care Teams include your primary Cardiologist (physician) and Advanced Practice Providers (APPs -  Physician Assistants and Nurse Practitioners) who all work together to provide you with the care you need, when you need it. You will need a follow up appointment in 6 weeks.   Any Other Special Instructions Will Be Listed Below (If Applicable).

## 2019-07-29 ENCOUNTER — Telehealth: Payer: Self-pay | Admitting: Emergency Medicine

## 2019-07-29 LAB — PRO B NATRIURETIC PEPTIDE: NT-Pro BNP: 56 pg/mL (ref 0–301)

## 2019-07-29 NOTE — Telephone Encounter (Signed)
Left message for patient to return call regarding results  

## 2019-08-03 ENCOUNTER — Ambulatory Visit (INDEPENDENT_AMBULATORY_CARE_PROVIDER_SITE_OTHER): Payer: Medicare Other

## 2019-08-03 DIAGNOSIS — I1310 Hypertensive heart and chronic kidney disease without heart failure, with stage 1 through stage 4 chronic kidney disease, or unspecified chronic kidney disease: Secondary | ICD-10-CM

## 2019-08-03 DIAGNOSIS — R0600 Dyspnea, unspecified: Secondary | ICD-10-CM

## 2019-08-03 NOTE — Progress Notes (Signed)
Complete echocardiogram has been performed.  Jimmy Sherrell Weir RDCS, RVT 

## 2019-12-28 DIAGNOSIS — E0590 Thyrotoxicosis, unspecified without thyrotoxic crisis or storm: Secondary | ICD-10-CM | POA: Diagnosis not present

## 2020-01-06 ENCOUNTER — Other Ambulatory Visit: Payer: Self-pay | Admitting: Neurology

## 2020-02-04 ENCOUNTER — Telehealth: Payer: Self-pay | Admitting: Neurology

## 2020-02-04 NOTE — Telephone Encounter (Signed)
1) Medication(s) Requested (by name): zolpidem (AMBIEN) 10 MG tablet   2) Pharmacy of Choice: [id number] [place], [place] - [address]  [address] [place] [address]

## 2020-02-07 MED ORDER — ZOLPIDEM TARTRATE 10 MG PO TABS: 10.0000 mg | ORAL_TABLET | Freq: Every evening | ORAL | 2 refills | Status: DC | PRN

## 2020-02-07 NOTE — Telephone Encounter (Signed)
The prescription for Ambien was sent in.

## 2020-02-08 DIAGNOSIS — E890 Postprocedural hypothyroidism: Secondary | ICD-10-CM | POA: Diagnosis not present

## 2020-03-15 DIAGNOSIS — E7800 Pure hypercholesterolemia, unspecified: Secondary | ICD-10-CM | POA: Diagnosis not present

## 2020-03-15 DIAGNOSIS — E890 Postprocedural hypothyroidism: Secondary | ICD-10-CM | POA: Diagnosis not present

## 2020-03-15 DIAGNOSIS — E1165 Type 2 diabetes mellitus with hyperglycemia: Secondary | ICD-10-CM | POA: Diagnosis not present

## 2020-03-15 DIAGNOSIS — N189 Chronic kidney disease, unspecified: Secondary | ICD-10-CM | POA: Diagnosis not present

## 2020-03-15 DIAGNOSIS — G609 Hereditary and idiopathic neuropathy, unspecified: Secondary | ICD-10-CM | POA: Diagnosis not present

## 2020-03-15 DIAGNOSIS — I10 Essential (primary) hypertension: Secondary | ICD-10-CM | POA: Diagnosis not present

## 2020-03-16 ENCOUNTER — Other Ambulatory Visit: Payer: Self-pay | Admitting: Neurology

## 2020-04-11 ENCOUNTER — Other Ambulatory Visit: Payer: Self-pay | Admitting: Neurology

## 2020-05-10 ENCOUNTER — Encounter: Payer: Self-pay | Admitting: Neurology

## 2020-05-10 ENCOUNTER — Ambulatory Visit: Payer: Medicare PPO | Admitting: Neurology

## 2020-05-10 ENCOUNTER — Other Ambulatory Visit: Payer: Self-pay

## 2020-05-10 VITALS — BP 115/68 | HR 61 | Ht 63.0 in | Wt 280.0 lb

## 2020-05-10 DIAGNOSIS — E1142 Type 2 diabetes mellitus with diabetic polyneuropathy: Secondary | ICD-10-CM

## 2020-05-10 DIAGNOSIS — G44221 Chronic tension-type headache, intractable: Secondary | ICD-10-CM

## 2020-05-10 DIAGNOSIS — G8929 Other chronic pain: Secondary | ICD-10-CM | POA: Diagnosis not present

## 2020-05-10 DIAGNOSIS — M797 Fibromyalgia: Secondary | ICD-10-CM

## 2020-05-10 DIAGNOSIS — M545 Low back pain: Secondary | ICD-10-CM | POA: Diagnosis not present

## 2020-05-10 MED ORDER — TIZANIDINE HCL 2 MG PO TABS: 2.0000 mg | ORAL_TABLET | Freq: Three times a day (TID) | ORAL | 5 refills | Status: DC

## 2020-05-10 MED ORDER — GABAPENTIN 400 MG PO CAPS: ORAL_CAPSULE | ORAL | 1 refills | Status: DC

## 2020-05-10 NOTE — Progress Notes (Signed)
PATIENT: Chloe [name] DOB: Nov 11, 1946  REASON FOR VISIT: follow up HISTORY FROM: patient  HISTORY OF PRESENT ILLNESS: Today 05/10/20  Chloe [name] is a 74 year old female with history of morbid obesity, fibromyalgia, chronic low back pain, right hip pain, left knee discomfort, right ankle pain.  She is on maximum doses of Cymbalta and gabapentin.  She uses a cane.  When last seen, complained of a new problem of discomfort in the neck and shoulders, back the head.  Could possibly be occipital neuralgia.  She was placed on tizanidine and sent for neuromuscular therapy.  The tizanidine has been quite helpful, she did not go for therapy.  She indicates the pain in her neck, shoulders, back of the head, is much improved.  Is not interested in neuromuscular therapy currently.  She lives alone, her daughters do her grocery and pharmacy trips.  She is not able to stand for prolonged periods of time or be very active due to chronic issues to the right ankle.  She has issues with the balance, has had a few falls.  She drives, but only in [place].  She presents today for evaluation unaccompanied.  HISTORY 05/26/2019 Chloe [name]: Chloe [name] is a 74 year old right-handed white female with a history of morbid obesity, fibromyalgia, chronic low back pain, right hip pain, left knee discomfort and right ankle pain.  The patient is on maximum doses of Cymbalta and gabapentin.  She is using a cane for ambulation, she is still having ongoing right ankle and foot pain following surgery 6 months ago.  The patient comes back in today with a new problem.  She has developed some pain and discomfort in the neck and shoulders and some discomfort in the back of the head, she may have occasional sharp jabbing pains into the back of the ear on the left side.  She has discomfort when she turns her head from one side to the next.  She denies any pain going down the arms to the hands.  She reports no recent falls, she has not had  any change in sensation or strength.  REVIEW OF SYSTEMS: Out of a complete 14 system review of symptoms, the patient complains only of the following symptoms, and all other reviewed systems are negative.  Headache, walking difficulty  ALLERGIES: Allergies  Allergen Reactions  . Adhesive [Tape] Hives and Rash  . Codeine Nausea Only  . Fenofibrate Other (See Comments)    Hair loss  . Simvastatin Rash    HOME MEDICATIONS: Outpatient Medications Prior to Visit  Medication Sig Dispense Refill  . alendronate (FOSAMAX) 70 MG tablet Take 70 mg by mouth once a week. Take with a full glass of water on an empty stomach.    Marland Kitchen aspirin 81 MG tablet Take 81 mg by mouth daily.    . Biotin 3 MG TABS Take 300 mcg by mouth daily.     Marland Kitchen CALCIUM PO Take 1,000 mg by mouth daily.     . Cinnamon 500 MG TABS Take 1,000 mg by mouth 2 (two) times daily.     Marland Kitchen dicyclomine (BENTYL) 20 MG tablet Take 20 mg by mouth 3 (three) times daily.     Marland Kitchen docusate sodium (COLACE) 100 MG capsule Take 1 capsule (100 mg total) by mouth 2 (two) times daily. 10 capsule 0  . DULoxetine (CYMBALTA) 60 MG capsule Take 1 capsule (60 mg total) by mouth 2 (two) times daily. 180 capsule 3  . furosemide (LASIX) 40 MG  tablet Take 80mg s every morning and 40mg s at 4pm    . insulin glargine (LANTUS) 100 UNIT/ML injection Inject into the skin. 40 units in the am    . Methylcellulose, Laxative, (FIBER THERAPY) 500 MG TABS Take 1,000 mg by mouth daily.    . metoprolol tartrate (LOPRESSOR) 25 MG tablet Take 25 mg by mouth 2 (two) times daily.    omeprazole (PRILOSEC) 20 MG capsule Take 20 mg by mouth 2 (two) times daily.     . Psyllium (METAMUCIL PO) Take 2 capsules by mouth daily.    . ramipril (ALTACE) 10 MG capsule Take 10 mg by mouth 2 (two) times daily.     . rosuvastatin (CRESTOR) 5 MG tablet Take 5 mg by mouth daily.    senna (SENOKOT) 8.6 MG TABS tablet Take 1 tablet (8.6 mg total) by mouth 2 (two) times daily. (Patient taking  differently: Take 1 tablet by mouth at bedtime. ) 120 each 0  . Turmeric 500 MG TABS Take 1,000 mg by mouth daily.    . Vitamin D, Cholecalciferol, 1000 units CAPS Take 1 Units by mouth 3 (three) times daily.     . vitamin E 400 UNIT capsule Take 400 Units by mouth daily.    Marland Kitchen gabapentin (NEURONTIN) 400 MG capsule TAKE 2 CAPSULES BY MOUTH IN THE MORNING THEN TAKE 3 CAPSULES AT NOON THEN TWO WITH SUPPER AND AT NIGHT 810 capsule 0  . tiZANidine (ZANAFLEX) 2 MG tablet TAKE 1 TABLET BY MOUTH THREE TIMES DAILY 90 tablet 3  . zolpidem (AMBIEN) 10 MG tablet Take 1 tablet (10 mg total) by mouth at bedtime as needed for sleep. 30 tablet 2  . traMADol (ULTRAM) 50 MG tablet tramadol 50 mg tablet     No facility-administered medications prior to visit.    PAST MEDICAL HISTORY: Past Medical History:  Diagnosis Date  . Ankle syndesmosis disruption, right, sequela 03/13/2017  . Cervical spondylosis without myelopathy 08/10/2015  . Chronic kidney disease (CKD) 09/08/2018  . Chronic kidney disease (CKD), stage III (moderate)   . Chronic low back pain 02/19/2017  . Chronic lower back pain   . Closed fracture dislocation of right ankle 01/07/2017  . Diabetic peripheral neuropathy associated with type 2 diabetes mellitus (HCC) 09/08/2018  . Dissection of cerebral artery (HCC) 1997   "no OR"  . Endometriosis   . Family history of adverse reaction to anesthesia    "daughter has PONV"  . Fibromyalgia   . GERD (gastroesophageal reflux disease)   . GERD without esophagitis 09/08/2018  . Headache 08/16/2013  . High cholesterol    hx  . History of hiatal hernia   . Hypertension   . Hypertensive heart disease without heart failure 09/08/2018  . Mixed hyperlipidemia 09/08/2018  . Morbid obesity (HCC) 09/08/2018  . Myalgia and myositis 08/16/2013  . Obesity   . Occipital headache    "qd" (01/10/2017)  . Orthostatic hypotension 02/02/2015  . Osteoarthritis 09/08/2018  . Plantar fasciitis   . Seasonal asthma   .  Shingles   . Stomach ulcer    "I take RX for it" (01/10/2017)  . Stroke [facility]) 1997; 08/2016   mild right arm weakness  . Syncope and collapse 08/16/2013  . Syndesmotic disruption of ankle, right, sequela 03/13/2017  . Type II diabetes mellitus (HCC)   . Vertebral artery dissection (HCC)    right     PAST SURGICAL HISTORY: Past Surgical History:  Procedure Laterality Date  . ANKLE ARTHROSCOPY  Right 09/17/2018   Procedure: Right ankle arthroscopy with extensive debridement, removal of loose body;  Surgeon: Toni ArthursHewitt, John, MD;  Location: [facility];  Service: Orthopedics;  Laterality: Right;  . ANKLE CLOSED REDUCTION Right 01/07/2017   Procedure: CLOSED REDUCTION ANKLE;  Surgeon: Samson FredericBrian Swinteck, MD;  Location: [facility];  Service: Orthopedics;  Laterality: Right;  . APPENDECTOMY    . CATARACT EXTRACTION W/ INTRAOCULAR LENS  IMPLANT, BILATERAL  03/2014 - 04/2014   left - right   . CLOSED REDUCTION NASAL FRACTURE  1990"s  . EXTERNAL FIXATION LEG Right 01/07/2017   Procedure: EXTERNAL FIXATION LEG;  Surgeon: Samson FredericBrian Swinteck, MD;  Location: [facility];  Service: Orthopedics;  Laterality: Right;  . FRACTURE SURGERY    . HARDWARE REMOVAL Right 09/17/2018   Procedure: Right ankle removal of deep implants tibia and fibula;  Surgeon: Toni ArthursHewitt, John, MD;  Location: [facility];  Service: Orthopedics;  Laterality: Right;  . I & D EXTREMITY Right 10/22/2018   Procedure: Irrigation and debridement and closure of right ankle wound;  Surgeon: Toni ArthursHewitt, John, MD;  Location: [facility];  Service: Orthopedics;  Laterality: Right;  . JOINT REPLACEMENT    . LAPAROSCOPIC CHOLECYSTECTOMY    . OPEN REDUCTION INTERNAL FIXATION (ORIF) TIBIA/FIBULA FRACTURE Right 01/20/2017   Procedure: OPEN REDUCTION INTERNAL FIXATION RIGHT ANKLE;  Surgeon: Samson FredericBrian Swinteck, MD;  Location: [facility];  Service: Orthopedics;  Laterality: Right;  . radioactive iodine  05/2019   for hyperthyroidism  .  SALPINGOOPHORECTOMY  1978  . SYNDESMOSIS REPAIR Right 03/13/2017   Procedure: HARDWARE REMOVAL OF RIGHT ABKLE, OPEN REDUCTION INTERNAL FIXATION OF SYNDESMOTIC INJURY;  Surgeon: Samson FredericBrian Swinteck, MD;  Location: [facility];  Service: Orthopedics;  Laterality: Right;  Popliteal block  . thyroid ablation  05/2019   for overactive thyroid  . TONSILLECTOMY    . TOTAL KNEE ARTHROPLASTY Bilateral   . TUBAL LIGATION  1978  . VAGINAL HYSTERECTOMY  1979   "partial"    FAMILY HISTORY: Family History  Problem Relation Age of Onset  . Alzheimer's disease Mother   . CAD Mother   . Diabetes Mother   . Heart attack Mother   . Congestive Heart Failure Father   . Diabetes Father   . Diabetes Sister   . Hypertension Brother   . Diabetes Brother   . Diabetes Brother     SOCIAL HISTORY: Social History   Socioeconomic History  . Marital status: Widowed    Spouse name: Trey PaulaJeff  . Number of children: 4  . Years of education: 13+  . Highest education level: Not on file  Occupational History  . Occupation: Retired   Tobacco Use  . Smoking status: Former Smoker    Packs/day: 0.50    Years: 11.00    Pack years: 5.50    Types: Cigarettes    Quit date: 1985    Years since quitting: 36.4  . Smokeless tobacco: Never Used  Substance and Sexual Activity  . Alcohol use: No  . Drug use: No  . Sexual activity: Never  Other Topics Concern  . Not on file  Social History Narrative   Patient lives at home with her husband.    Patient is a retired Architectural technologistteacher's assistant.    Patient has a high school education and some college.    Patient has four children.   Patient is right handed.   Patient drinks about 2 cups caffeine daily.   Social Determinants of Corporate investment bankerHealth   Financial Resource  Strain:   . Difficulty of Paying Living Expenses:   Food Insecurity:   . Worried About Programme researcher, broadcasting/film/video in the Last Year:   . Barista in the Last Year:   Transportation Needs:   . Freight forwarder (Medical):     Marland Kitchen Lack of Transportation (Non-Medical):   Physical Activity:   . Days of Exercise per Week:   . Minutes of Exercise per Session:   Stress:   . Feeling of Stress :   Social Connections:   . Frequency of Communication with Friends and Family:   . Frequency of Social Gatherings with Friends and Family:   . Attends Religious Services:   . Active Member of Clubs or Organizations:   . Attends Banker Meetings:   Marland Kitchen Marital Status:   Intimate Partner Violence:   . Fear of Current or Ex-Partner:   . Emotionally Abused:   Marland Kitchen Physically Abused:   . Sexually Abused:    PHYSICAL EXAM  Vitals:   05/10/20 1012  BP: 115/68  Pulse: 61  Weight: 280 lb (127 kg)  Height: 5\' 3"  (1.6 m)   Body mass index is 49.6 kg/m.  Generalized: Well developed, in no acute distress, morbidly obese  Neurological examination  Mentation: Alert oriented to time, place, history taking. Follows all commands speech and language fluent Cranial nerve II-XII: Pupils were equal round reactive to light. Extraocular movements were full, visual field were full on confrontational test. Facial sensation and strength were normal.  Discomfort with left rotation of her cervical spine, ROM is limited Motor: Good strength of all extremities,  Sensory: Sensory testing is intact to soft touch on all 4 extremities. No evidence of extinction is noted.  Coordination: Cerebellar testing reveals good finger-nose-finger and heel-to-shin bilaterally.  Gait and station: Gait is wide-based, cautious, limping type gait on the right, uses a cane, tandem gait was not attempted. Reflexes: Deep tendon reflexes are symmetric   DIAGNOSTIC DATA (LABS, IMAGING, TESTING) - I reviewed patient records, labs, notes, testing and imaging myself where available.  Lab Results  Component Value Date   WBC 4.8 03/13/2017   HGB 11.6 (L) 10/22/2018   HCT 34.0 (L) 10/22/2018   MCV 90.6 03/13/2017   PLT 172 03/13/2017      Component Value  Date/Time   NA 142 10/22/2018 0955   K 3.6 10/22/2018 0955   CL 106 10/22/2018 0955   CO2 28 09/11/2018 1237   GLUCOSE 96 10/22/2018 0955   BUN 17 10/22/2018 0955   CREATININE 1.50 (H) 10/22/2018 0955   CALCIUM 9.4 09/11/2018 1237   PROT 6.9 06/04/2007 1631   ALBUMIN 4.0 06/04/2007 1631   AST 31 06/04/2007 1631   ALT 30 06/04/2007 1631   ALKPHOS 94 06/04/2007 1631   BILITOT 0.8 06/04/2007 1631   GFRNONAA 32 (L) 09/11/2018 1237   GFRAA 37 (L) 09/11/2018 1237   No results found for: CHOL, HDL, LDLCALC, LDLDIRECT, TRIG, CHOLHDL Lab Results  Component Value Date   HGBA1C 5.5 03/13/2017   No results found for: VITAMINB12 No results found for: TSH    ASSESSMENT AND PLAN 74 y.o. year old female  has a past medical history of Ankle syndesmosis disruption, right, sequela (03/13/2017), Cervical spondylosis without myelopathy (08/10/2015), Chronic kidney disease (CKD) (09/08/2018), Chronic kidney disease (CKD), stage III (moderate), Chronic low back pain (02/19/2017), Chronic lower back pain, Closed fracture dislocation of right ankle (01/07/2017), Diabetic peripheral neuropathy associated with type 2 diabetes mellitus (HCC) (  09/08/2018), Dissection of cerebral artery ([place]) (1997), Endometriosis, Family history of adverse reaction to anesthesia, Fibromyalgia, GERD (gastroesophageal reflux disease), GERD without esophagitis (09/08/2018), Headache (08/16/2013), High cholesterol, History of hiatal hernia, Hypertension, Hypertensive heart disease without heart failure (09/08/2018), Mixed hyperlipidemia (09/08/2018), Morbid obesity ([place]) (09/08/2018), Myalgia and myositis (08/16/2013), Obesity, Occipital headache, Orthostatic hypotension (02/02/2015), Osteoarthritis (09/08/2018), Plantar fasciitis, Seasonal asthma, Shingles, Stomach ulcer, Stroke ([place]) (1997; 08/2016), Syncope and collapse (08/16/2013), Syndesmotic disruption of ankle, right, sequela (03/13/2017), Type II diabetes mellitus ([place]), and Vertebral artery  dissection ([place]). here with:  1.  Fibromyalgia 2.  Chronic low back pain 3.  Neck pain, cervicogenic headache 4.  Left ear lancinating pain, possible lesser branch occipital neuralgia  She has improved since last seen, she will remain on Cymbalta, gabapentin, and tizanidine. I will order her a Rollator walker, as she feels if she had the option to sit when she goes out she could be more mobile. I think she could benefit from neuromuscular therapy, but isn't interested at this time. She will follow-up in 6 months or sooner if needed.  Checked drug registry, and CMA called her pharmacy for Ambien, she has 1 refill on hand to pick up. I will not refill yet. Dr. Jannifer Franklin sent in 02/07/20, 30 tablets with 2 refills. Is in the [facility] registry under "[place]".   I spent 30 minutes of face-to-face and non-face-to-face time with patient.  This included previsit chart review, lab review, study review, order entry, electronic health record documentation, patient education.  Butler Denmark, AGNP-C, DNP 05/10/2020, 11:16 AM [facility] [address] [place], [place] [address] [phone]

## 2020-05-10 NOTE — Patient Instructions (Signed)
Continue current medications  I will give you order for rolling walker See you back in 6 months

## 2020-05-11 NOTE — Progress Notes (Signed)
I have read the note, and I agree with the clinical assessment and plan.  Verdia Bolt K Ulises Wolfinger   

## 2020-05-18 DIAGNOSIS — E0590 Thyrotoxicosis, unspecified without thyrotoxic crisis or storm: Secondary | ICD-10-CM | POA: Diagnosis not present

## 2020-05-18 DIAGNOSIS — E890 Postprocedural hypothyroidism: Secondary | ICD-10-CM | POA: Diagnosis not present

## 2020-05-19 DIAGNOSIS — M25571 Pain in right ankle and joints of right foot: Secondary | ICD-10-CM | POA: Diagnosis not present

## 2020-05-19 DIAGNOSIS — M79671 Pain in right foot: Secondary | ICD-10-CM | POA: Diagnosis not present

## 2020-05-19 DIAGNOSIS — Z6841 Body Mass Index (BMI) 40.0 and over, adult: Secondary | ICD-10-CM | POA: Diagnosis not present

## 2020-06-05 DIAGNOSIS — S92341A Displaced fracture of fourth metatarsal bone, right foot, initial encounter for closed fracture: Secondary | ICD-10-CM | POA: Diagnosis not present

## 2020-06-05 DIAGNOSIS — M79671 Pain in right foot: Secondary | ICD-10-CM | POA: Diagnosis not present

## 2020-06-05 DIAGNOSIS — S92331A Displaced fracture of third metatarsal bone, right foot, initial encounter for closed fracture: Secondary | ICD-10-CM | POA: Diagnosis not present

## 2020-06-05 DIAGNOSIS — S92343A Displaced fracture of fourth metatarsal bone, unspecified foot, initial encounter for closed fracture: Secondary | ICD-10-CM

## 2020-06-05 HISTORY — DX: Displaced fracture of fourth metatarsal bone, unspecified foot, initial encounter for closed fracture: S92.343A

## 2020-06-12 ENCOUNTER — Other Ambulatory Visit: Payer: Self-pay | Admitting: Neurology

## 2020-06-28 DIAGNOSIS — M79671 Pain in right foot: Secondary | ICD-10-CM | POA: Diagnosis not present

## 2020-06-28 DIAGNOSIS — E119 Type 2 diabetes mellitus without complications: Secondary | ICD-10-CM | POA: Diagnosis not present

## 2020-06-28 DIAGNOSIS — S92301D Fracture of unspecified metatarsal bone(s), right foot, subsequent encounter for fracture with routine healing: Secondary | ICD-10-CM | POA: Diagnosis not present

## 2020-06-28 DIAGNOSIS — S90821D Blister (nonthermal), right foot, subsequent encounter: Secondary | ICD-10-CM | POA: Diagnosis not present

## 2020-07-13 DIAGNOSIS — E1159 Type 2 diabetes mellitus with other circulatory complications: Secondary | ICD-10-CM | POA: Diagnosis not present

## 2020-07-21 DIAGNOSIS — S90821D Blister (nonthermal), right foot, subsequent encounter: Secondary | ICD-10-CM | POA: Diagnosis not present

## 2020-07-21 DIAGNOSIS — M79671 Pain in right foot: Secondary | ICD-10-CM | POA: Diagnosis not present

## 2020-07-21 DIAGNOSIS — M25571 Pain in right ankle and joints of right foot: Secondary | ICD-10-CM | POA: Diagnosis not present

## 2020-07-21 DIAGNOSIS — E119 Type 2 diabetes mellitus without complications: Secondary | ICD-10-CM | POA: Diagnosis not present

## 2020-07-21 DIAGNOSIS — M12571 Traumatic arthropathy, right ankle and foot: Secondary | ICD-10-CM | POA: Diagnosis not present

## 2020-07-21 DIAGNOSIS — S92301D Fracture of unspecified metatarsal bone(s), right foot, subsequent encounter for fracture with routine healing: Secondary | ICD-10-CM | POA: Diagnosis not present

## 2020-08-09 ENCOUNTER — Other Ambulatory Visit: Payer: Self-pay | Admitting: Neurology

## 2020-08-18 DIAGNOSIS — S92334D Nondisplaced fracture of third metatarsal bone, right foot, subsequent encounter for fracture with routine healing: Secondary | ICD-10-CM | POA: Diagnosis not present

## 2020-08-18 DIAGNOSIS — S92344D Nondisplaced fracture of fourth metatarsal bone, right foot, subsequent encounter for fracture with routine healing: Secondary | ICD-10-CM | POA: Diagnosis not present

## 2020-08-18 DIAGNOSIS — M79671 Pain in right foot: Secondary | ICD-10-CM | POA: Diagnosis not present

## 2020-09-20 DIAGNOSIS — G609 Hereditary and idiopathic neuropathy, unspecified: Secondary | ICD-10-CM | POA: Diagnosis not present

## 2020-09-20 DIAGNOSIS — N189 Chronic kidney disease, unspecified: Secondary | ICD-10-CM | POA: Diagnosis not present

## 2020-09-20 DIAGNOSIS — E890 Postprocedural hypothyroidism: Secondary | ICD-10-CM | POA: Diagnosis not present

## 2020-09-20 DIAGNOSIS — E1165 Type 2 diabetes mellitus with hyperglycemia: Secondary | ICD-10-CM | POA: Diagnosis not present

## 2020-09-20 DIAGNOSIS — E7800 Pure hypercholesterolemia, unspecified: Secondary | ICD-10-CM | POA: Diagnosis not present

## 2020-09-20 DIAGNOSIS — I10 Essential (primary) hypertension: Secondary | ICD-10-CM | POA: Diagnosis not present

## 2020-09-20 DIAGNOSIS — Z23 Encounter for immunization: Secondary | ICD-10-CM | POA: Diagnosis not present

## 2020-10-02 DIAGNOSIS — N1832 Chronic kidney disease, stage 3b: Secondary | ICD-10-CM | POA: Diagnosis not present

## 2020-10-02 DIAGNOSIS — I129 Hypertensive chronic kidney disease with stage 1 through stage 4 chronic kidney disease, or unspecified chronic kidney disease: Secondary | ICD-10-CM | POA: Diagnosis not present

## 2020-10-02 DIAGNOSIS — D631 Anemia in chronic kidney disease: Secondary | ICD-10-CM | POA: Diagnosis not present

## 2020-10-02 DIAGNOSIS — N2581 Secondary hyperparathyroidism of renal origin: Secondary | ICD-10-CM | POA: Diagnosis not present

## 2020-10-19 DIAGNOSIS — Z6841 Body Mass Index (BMI) 40.0 and over, adult: Secondary | ICD-10-CM | POA: Diagnosis not present

## 2020-10-19 DIAGNOSIS — Z9181 History of falling: Secondary | ICD-10-CM | POA: Diagnosis not present

## 2020-10-19 DIAGNOSIS — I119 Hypertensive heart disease without heart failure: Secondary | ICD-10-CM | POA: Diagnosis not present

## 2020-10-19 DIAGNOSIS — S6991XA Unspecified injury of right wrist, hand and finger(s), initial encounter: Secondary | ICD-10-CM | POA: Diagnosis not present

## 2020-10-19 DIAGNOSIS — Z1331 Encounter for screening for depression: Secondary | ICD-10-CM | POA: Diagnosis not present

## 2020-10-19 IMAGING — NM NM THYROID IMAGING W/ UPTAKE MULTI (4&24 HR)
4 series · 4 of 4 positions shown · non-contrast
Comparison: None

CLINICAL DATA: Abnormal blood work, neck swelling, heart
palpitations and fluttering, thyroid nodule

EXAM:
THYROID SCAN AND UPTAKE - 4 AND 24 HOURS
TECHNIQUE: Following oral administration of Q-S8M capsule, anterior planar
imaging was acquired at 24 hours. Thyroid uptake was calculated with
a thyroid probe at 4-6 hours and 24 hours.
RADIOPHARMACEUTICALS:  418 uCi Q-S8M sodium iodide p.o.

[iu thyroid uptake i123 · 3.07mm/px · 1 of 1 slices shown (1 of 4)]
[im 1/1]
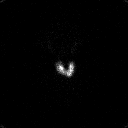

[iu thyroid uptake i123 · 3.10mm/px · 1 of 1 slices shown (2 of 4)]
[im 1/1]
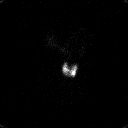

[iu thyroid uptake i123 · 3.10mm/px · 1 of 1 slices shown (3 of 4)]
[im 1/1]
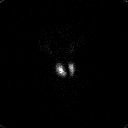

[iu thyroid uptake i123 · 3.10mm/px · 1 of 1 slices shown (4 of 4)]
[im 1/1]
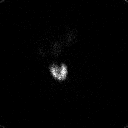

[4 of 4 positions shown; findings below may reference images not displayed]

FINDINGS: Homogeneous tracer distribution in both thyroid lobes.

No focal areas of increased or decreased tracer localization seen..

4 hour Q-S8M uptake = 6.9% (normal 5-20%)

24 hour Q-S8M uptake = 23.3% (normal 10-30%) the
IMPRESSION: Normal thyroid scan.

Normal 4 hour and 24 hour radio iodine uptakes as above.

## 2020-10-23 ENCOUNTER — Other Ambulatory Visit: Payer: Self-pay | Admitting: Neurology

## 2020-10-27 DIAGNOSIS — S62639D Displaced fracture of distal phalanx of unspecified finger, subsequent encounter for fracture with routine healing: Secondary | ICD-10-CM | POA: Diagnosis not present

## 2020-10-27 DIAGNOSIS — S62639A Displaced fracture of distal phalanx of unspecified finger, initial encounter for closed fracture: Secondary | ICD-10-CM | POA: Diagnosis not present

## 2020-10-27 DIAGNOSIS — M19041 Primary osteoarthritis, right hand: Secondary | ICD-10-CM | POA: Diagnosis not present

## 2020-10-27 DIAGNOSIS — M79644 Pain in right finger(s): Secondary | ICD-10-CM | POA: Diagnosis not present

## 2020-10-27 DIAGNOSIS — M20019 Mallet finger of unspecified finger(s): Secondary | ICD-10-CM | POA: Diagnosis not present

## 2020-10-27 DIAGNOSIS — M6740 Ganglion, unspecified site: Secondary | ICD-10-CM | POA: Diagnosis not present

## 2020-10-27 DIAGNOSIS — M25641 Stiffness of right hand, not elsewhere classified: Secondary | ICD-10-CM | POA: Diagnosis not present

## 2020-10-27 HISTORY — DX: Primary osteoarthritis, right hand: M19.041

## 2020-10-27 HISTORY — DX: Mallet finger of unspecified finger(s): M20.019

## 2020-11-06 DIAGNOSIS — Z794 Long term (current) use of insulin: Secondary | ICD-10-CM | POA: Diagnosis not present

## 2020-11-06 DIAGNOSIS — E039 Hypothyroidism, unspecified: Secondary | ICD-10-CM | POA: Diagnosis not present

## 2020-11-06 DIAGNOSIS — M7989 Other specified soft tissue disorders: Secondary | ICD-10-CM | POA: Diagnosis not present

## 2020-11-06 DIAGNOSIS — Z0000 Encounter for general adult medical examination without abnormal findings: Secondary | ICD-10-CM | POA: Diagnosis not present

## 2020-11-06 DIAGNOSIS — E1142 Type 2 diabetes mellitus with diabetic polyneuropathy: Secondary | ICD-10-CM | POA: Diagnosis not present

## 2020-11-06 DIAGNOSIS — R2241 Localized swelling, mass and lump, right lower limb: Secondary | ICD-10-CM | POA: Diagnosis not present

## 2020-11-06 DIAGNOSIS — E1122 Type 2 diabetes mellitus with diabetic chronic kidney disease: Secondary | ICD-10-CM | POA: Diagnosis not present

## 2020-11-06 DIAGNOSIS — E782 Mixed hyperlipidemia: Secondary | ICD-10-CM | POA: Diagnosis not present

## 2020-11-06 DIAGNOSIS — N183 Chronic kidney disease, stage 3 unspecified: Secondary | ICD-10-CM | POA: Diagnosis not present

## 2020-11-09 DIAGNOSIS — M7989 Other specified soft tissue disorders: Secondary | ICD-10-CM | POA: Diagnosis not present

## 2020-11-09 DIAGNOSIS — M79604 Pain in right leg: Secondary | ICD-10-CM | POA: Diagnosis not present

## 2020-11-09 DIAGNOSIS — R600 Localized edema: Secondary | ICD-10-CM | POA: Diagnosis not present

## 2020-11-09 DIAGNOSIS — R2241 Localized swelling, mass and lump, right lower limb: Secondary | ICD-10-CM | POA: Diagnosis not present

## 2020-11-13 ENCOUNTER — Encounter: Payer: Self-pay | Admitting: Neurology

## 2020-11-13 ENCOUNTER — Ambulatory Visit: Payer: Medicare PPO | Admitting: Neurology

## 2020-11-13 VITALS — BP 116/72 | HR 78 | Ht 63.0 in | Wt 275.0 lb

## 2020-11-13 DIAGNOSIS — G44221 Chronic tension-type headache, intractable: Secondary | ICD-10-CM

## 2020-11-13 DIAGNOSIS — M545 Low back pain, unspecified: Secondary | ICD-10-CM

## 2020-11-13 DIAGNOSIS — G8929 Other chronic pain: Secondary | ICD-10-CM | POA: Diagnosis not present

## 2020-11-13 DIAGNOSIS — M797 Fibromyalgia: Secondary | ICD-10-CM

## 2020-11-13 MED ORDER — GABAPENTIN 400 MG PO CAPS: ORAL_CAPSULE | ORAL | 1 refills | Status: DC

## 2020-11-13 MED ORDER — TIZANIDINE HCL 2 MG PO TABS: 2.0000 mg | ORAL_TABLET | Freq: Three times a day (TID) | ORAL | 5 refills | Status: DC

## 2020-11-13 MED ORDER — DULOXETINE HCL 60 MG PO CPEP: 60.0000 mg | ORAL_CAPSULE | Freq: Two times a day (BID) | ORAL | 1 refills | Status: DC

## 2020-11-13 NOTE — Progress Notes (Signed)
I have read the note, and I agree with the clinical assessment and plan.  Christal Lagerstrom K Shantana Christon   

## 2020-11-13 NOTE — Progress Notes (Signed)
PATIENT: Chloe [name] DOB: [birth year]  REASON FOR VISIT: follow up HISTORY FROM: patient  HISTORY OF PRESENT ILLNESS: Today 11/13/20 Chloe [name] is a 74 year old female with history of morbid obesity, fibromyalgia, chronic low back pain, right hip pain, left knee pain, right ankle pain, and occipital neuralgia. She is on maximum dose Cymbalta and gabapentin. Did not go to neuromuscular therapy for the occipital neuralgia. Remains on tizanidine. Headaches are improved. Tizanidine, helps not only the headaches, but overall general pain. Has had 2 falls since last seen, as result of standing too quickly. She fractured her right fourth finger, seeing Dr. Merlyn Lot. Has chronic right ankle pain, has required multiple surgeries over the years. Is using a walker. Lives alone, her 3 daughters take care of her, do her errands, she drives in her town. Has had a sharp pain to her right lateral thigh, swelling, saw PCP, sent for ultrasound, results are pending. Has osteopenia, pending results of recent DEXA. Presents today for follow-up unaccompanied. We have prescribed Cymbalta, gabapentin, tizanidine, and Ambien.  HISTORY 05/10/2020 SS:Chloe [name] is a 74 year old female with history of morbid obesity, fibromyalgia, chronic low back pain, right hip pain, left knee discomfort, right ankle pain.  She is on maximum doses of Cymbalta and gabapentin.  She uses a cane.  When last seen, complained of a new problem of discomfort in the neck and shoulders, back the head.  Could possibly be occipital neuralgia.  She was placed on tizanidine and sent for neuromuscular therapy.  The tizanidine has been quite helpful, she did not go for therapy.  She indicates the pain in her neck, shoulders, back of the head, is much improved.  Is not interested in neuromuscular therapy currently.  She lives alone, her daughters do her grocery and pharmacy trips.  She is not able to stand for prolonged periods of time or be very active due  to chronic issues to the right ankle.  She has issues with the balance, has had a few falls.  She drives, but only in [place].  She presents today for evaluation unaccompanied.  REVIEW OF SYSTEMS: Out of a complete 14 system review of symptoms, the patient complains only of the following symptoms, and all other reviewed systems are negative.  Chronic pain, headache  ALLERGIES: Allergies  Allergen Reactions  . Adhesive [Tape] Hives and Rash  . Codeine Nausea Only  . Fenofibrate Other (See Comments)    Hair loss  . Simvastatin Rash    HOME MEDICATIONS: Outpatient Medications Prior to Visit  Medication Sig Dispense Refill  . alendronate (FOSAMAX) 70 MG tablet Take 70 mg by mouth once a week. Take with a full glass of water on an empty stomach.    Marland Kitchen aspirin 81 MG tablet Take 81 mg by mouth daily.    . Biotin 3 MG TABS Take 300 mcg by mouth daily.     Marland Kitchen CALCIUM PO Take 1,000 mg by mouth daily.     . Cinnamon 500 MG TABS Take 1,000 mg by mouth 2 (two) times daily.     Marland Kitchen dicyclomine (BENTYL) 20 MG tablet Take 20 mg by mouth 3 (three) times daily.     Marland Kitchen docusate sodium (COLACE) 100 MG capsule Take 1 capsule (100 mg total) by mouth 2 (two) times daily. 10 capsule 0  . furosemide (LASIX) 40 MG tablet Take s every morning and s at 4pm    . insulin glargine (LANTUS) 100 UNIT/ML injection Inject into the skin. 40 units  in the am    . Methylcellulose, Laxative, (FIBER THERAPY) 500 MG TABS Take 1,000 mg by mouth daily.    . metoprolol tartrate (LOPRESSOR) 25 MG tablet Take 25 mg by mouth 2 (two) times daily.    Marland Kitchen. omeprazole (PRILOSEC) 20 MG capsule Take 20 mg by mouth 2 (two) times daily.     . Psyllium (METAMUCIL PO) Take 2 capsules by mouth daily.    . ramipril (ALTACE) 10 MG capsule Take 10 mg by mouth 2 (two) times daily.     . rosuvastatin (CRESTOR) 5 MG tablet Take 5 mg by mouth daily.    Marland Kitchen. senna (SENOKOT) 8.6 MG TABS tablet Take 1 tablet (8.6 mg total) by mouth 2 (two) times  daily. (Patient taking differently: Take 1 tablet by mouth at bedtime. ) 120 each 0  . Turmeric 500 MG TABS Take 1,000 mg by mouth daily.    . Vitamin D, Cholecalciferol, 1000 units CAPS Take 1 Units by mouth 3 (three) times daily.     . vitamin E 400 UNIT capsule Take 400 Units by mouth daily.    Marland Kitchen. zolpidem (AMBIEN) 10 MG tablet TAKE 1 TABLET BY MOUTH AT BEDTIME AS NEEDED FOR SLEEP 30 tablet 1  . DULoxetine (CYMBALTA) 60 MG capsule Take 1 capsule by mouth twice daily 180 capsule 1  . gabapentin (NEURONTIN) 400 MG capsule TAKE 2 CAPSULES BY MOUTH IN THE MORNING THEN TAKE 3 CAPSULES AT NOON THEN TWO WITH SUPPER AND AT NIGHT 810 capsule 1  . tiZANidine (ZANAFLEX) 2 MG tablet Take 1 tablet (2 mg total) by mouth 3 (three) times daily. 90 tablet 5   No facility-administered medications prior to visit.    PAST MEDICAL HISTORY: Past Medical History:  Diagnosis Date  . Ankle syndesmosis disruption, right, sequela 03/13/2017  . Cervical spondylosis without myelopathy 08/10/2015  . Chronic kidney disease (CKD) 09/08/2018  . Chronic kidney disease (CKD), stage III (moderate) (HCC)   . Chronic low back pain 02/19/2017  . Chronic lower back pain   . Closed fracture dislocation of right ankle 01/07/2017  . Diabetic peripheral neuropathy associated with type 2 diabetes mellitus (HCC) 09/08/2018  . Dissection of cerebral artery (HCC) 1997   "no OR"  . Endometriosis   . Family history of adverse reaction to anesthesia    "daughter has PONV"  . Fibromyalgia   . GERD (gastroesophageal reflux disease)   . GERD without esophagitis 09/08/2018  . Headache 08/16/2013  . High cholesterol    hx  . History of hiatal hernia   . Hypertension   . Hypertensive heart disease without heart failure 09/08/2018  . Mixed hyperlipidemia 09/08/2018  . Morbid obesity (HCC) 09/08/2018  . Myalgia and myositis 08/16/2013  . Obesity   . Occipital headache    "qd" (01/10/2017)  . Orthostatic hypotension 02/02/2015  .  Osteoarthritis 09/08/2018  . Plantar fasciitis   . Seasonal asthma   . Shingles   . Stomach ulcer    "I take RX for it" (01/10/2017)  . Stroke [facility](HCC) 1997; 08/2016   mild right arm weakness  . Syncope and collapse 08/16/2013  . Syndesmotic disruption of ankle, right, sequela 03/13/2017  . Type II diabetes mellitus (HCC)   . Vertebral artery dissection (HCC)    right     PAST SURGICAL HISTORY: Past Surgical History:  Procedure Laterality Date  . ANKLE ARTHROSCOPY Right 09/17/2018   Procedure: Right ankle arthroscopy with extensive debridement, removal of loose body;  Surgeon: Toni ArthursHewitt, John,  MD;  Location: [facility];  Service: Orthopedics;  Laterality: Right;  . ANKLE CLOSED REDUCTION Right 01/07/2017   Procedure: CLOSED REDUCTION ANKLE;  Surgeon: Samson Frederic, MD;  Location: [facility] OR;  Service: Orthopedics;  Laterality: Right;  . APPENDECTOMY    . CATARACT EXTRACTION W/ INTRAOCULAR LENS  IMPLANT, BILATERAL  03/2014 - 04/2014   left - right   . CLOSED REDUCTION NASAL FRACTURE  1990"s  . EXTERNAL FIXATION LEG Right 01/07/2017   Procedure: EXTERNAL FIXATION LEG;  Surgeon: Samson Frederic, MD;  Location: [facility] OR;  Service: Orthopedics;  Laterality: Right;  . FRACTURE SURGERY    . HARDWARE REMOVAL Right 09/17/2018   Procedure: Right ankle removal of deep implants tibia and fibula;  Surgeon: Toni Arthurs, MD;  Location: [facility];  Service: Orthopedics;  Laterality: Right;  . I & D EXTREMITY Right 10/22/2018   Procedure: Irrigation and debridement and closure of right ankle wound;  Surgeon: Toni Arthurs, MD;  Location: [facility];  Service: Orthopedics;  Laterality: Right;  . JOINT REPLACEMENT    . LAPAROSCOPIC CHOLECYSTECTOMY    . OPEN REDUCTION INTERNAL FIXATION (ORIF) TIBIA/FIBULA FRACTURE Right 01/20/2017   Procedure: OPEN REDUCTION INTERNAL FIXATION RIGHT ANKLE;  Surgeon: Samson Frederic, MD;  Location: [facility] OR;  Service: Orthopedics;  Laterality: Right;   . radioactive iodine  05/2019   for hyperthyroidism  . SALPINGOOPHORECTOMY  1978  . SYNDESMOSIS REPAIR Right 03/13/2017   Procedure: HARDWARE REMOVAL OF RIGHT ABKLE, OPEN REDUCTION INTERNAL FIXATION OF SYNDESMOTIC INJURY;  Surgeon: Samson Frederic, MD;  Location: [facility];  Service: Orthopedics;  Laterality: Right;  Popliteal block  . thyroid ablation  05/2019   for overactive thyroid  . TONSILLECTOMY    . TOTAL KNEE ARTHROPLASTY Bilateral   . TUBAL LIGATION  1978  . VAGINAL HYSTERECTOMY  1979   "partial"    FAMILY HISTORY: Family History  Problem Relation Age of Onset  . Alzheimer's disease Mother   . CAD Mother   . Diabetes Mother   . Heart attack Mother   . Congestive Heart Failure Father   . Diabetes Father   . Diabetes Sister   . Hypertension Brother   . Diabetes Brother   . Diabetes Brother     SOCIAL HISTORY: Social History   Socioeconomic History  . Marital status: Widowed    Spouse name: Trey Paula  . Number of children: 4  . Years of education: 13+  . Highest education level: Not on file  Occupational History  . Occupation: Retired   Tobacco Use  . Smoking status: Former Smoker    Packs/day: 0.50    Years: 11.00    Pack years: 5.50    Types: Cigarettes    Quit date: 1985    Years since quitting: 36.9  . Smokeless tobacco: Never Used  Vaping Use  . Vaping Use: Never used  Substance and Sexual Activity  . Alcohol use: No  . Drug use: No  . Sexual activity: Never  Other Topics Concern  . Not on file  Social History Narrative   Patient lives at home with her husband.    Patient is a retired Architectural technologist.    Patient has a high school education and some college.    Patient has four children.   Patient is right handed.   Patient drinks about 2 cups caffeine daily.   Social Determinants of Health   Financial Resource Strain:   . Difficulty of Paying Living Expenses: Not  on file  Food Insecurity:   . Worried About Programme researcher, broadcasting/film/video in the Last  Year: Not on file  . Ran Out of Food in the Last Year: Not on file  Transportation Needs:   . Lack of Transportation (Medical): Not on file  . Lack of Transportation (Non-Medical): Not on file  Physical Activity:   . Days of Exercise per Week: Not on file  . Minutes of Exercise per Session: Not on file  Stress:   . Feeling of Stress : Not on file  Social Connections:   . Frequency of Communication with Friends and Family: Not on file  . Frequency of Social Gatherings with Friends and Family: Not on file  . Attends Religious Services: Not on file  . Active Member of Clubs or Organizations: Not on file  . Attends Banker Meetings: Not on file  . Marital Status: Not on file  Intimate Partner Violence:   . Fear of Current or Ex-Partner: Not on file  . Emotionally Abused: Not on file  . Physically Abused: Not on file  . Sexually Abused: Not on file   PHYSICAL EXAM  Vitals:   11/13/20 1033  BP: 116/72  Pulse: 78  Weight: 275 lb (124.7 kg)  Height: 5\' 3"  (1.6 m)   Body mass index is 48.71 kg/m.  Generalized: Well developed, in no acute distress, morbidly obese  Neurological examination  Mentation: Alert oriented to time, place, history taking. Follows all commands speech and language fluent Cranial nerve II-XII: Pupils were equal round reactive to light. Extraocular movements were full, visual field were full on confrontational test. Facial sensation and strength were normal. Head turning and shoulder shrug were normal and symmetric. Motor: Good strength of all extremities Sensory: Sensory testing is intact to soft touch on all 4 extremities. No evidence of extinction is noted.  Coordination: Cerebellar testing reveals good finger-nose-finger and heel-to-shin bilaterally.  Gait and station: Gait is wide-based, limping type gait on the right, uses a cane Reflexes: Deep tendon reflexes are symmetric   DIAGNOSTIC DATA (LABS, IMAGING, TESTING) - I reviewed patient  records, labs, notes, testing and imaging myself where available.  Lab Results  Component Value Date   WBC 4.8 03/13/2017   HGB 11.6 (L) 10/22/2018   HCT 34.0 (L) 10/22/2018   MCV 90.6 03/13/2017   PLT 172 03/13/2017      Component Value Date/Time   NA 142 10/22/2018 0955   K 3.6 10/22/2018 0955   CL 106 10/22/2018 0955   CO2 28 09/11/2018 1237   GLUCOSE 96 10/22/2018 0955   BUN 17 10/22/2018 0955   CREATININE 1.50 (H) 10/22/2018 0955   CALCIUM 9.4 09/11/2018 1237   PROT 6.9 06/04/2007 1631   ALBUMIN 4.0 06/04/2007 1631   AST 31 06/04/2007 1631   ALT 30 06/04/2007 1631   ALKPHOS 94 06/04/2007 1631   BILITOT 0.8 06/04/2007 1631   GFRNONAA 32 (L) 09/11/2018 1237   GFRAA 37 (L) 09/11/2018 1237   No results found for: CHOL, HDL, LDLCALC, LDLDIRECT, TRIG, CHOLHDL Lab Results  Component Value Date   HGBA1C 5.5 03/13/2017   No results found for: VITAMINB12 No results found for: TSH  ASSESSMENT AND PLAN 74 y.o. year old female  has a past medical history of Ankle syndesmosis disruption, right, sequela (03/13/2017), Cervical spondylosis without myelopathy (08/10/2015), Chronic kidney disease (CKD) (09/08/2018), Chronic kidney disease (CKD), stage III (moderate) (HCC), Chronic low back pain (02/19/2017), Chronic lower back pain, Closed fracture dislocation  of right ankle (01/07/2017), Diabetic peripheral neuropathy associated with type 2 diabetes mellitus (HCC) (09/08/2018), Dissection of cerebral artery (HCC) (1997), Endometriosis, Family history of adverse reaction to anesthesia, Fibromyalgia, GERD (gastroesophageal reflux disease), GERD without esophagitis (09/08/2018), Headache (08/16/2013), High cholesterol, History of hiatal hernia, Hypertension, Hypertensive heart disease without heart failure (09/08/2018), Mixed hyperlipidemia (09/08/2018), Morbid obesity (HCC) (09/08/2018), Myalgia and myositis (08/16/2013), Obesity, Occipital headache, Orthostatic hypotension (02/02/2015), Osteoarthritis  (09/08/2018), Plantar fasciitis, Seasonal asthma, Shingles, Stomach ulcer, Stroke (HCC) (1997; 08/2016), Syncope and collapse (08/16/2013), Syndesmotic disruption of ankle, right, sequela (03/13/2017), Type II diabetes mellitus (HCC), and Vertebral artery dissection (HCC). here with:  1. Fibromyalgia 2. Chronic low back pain 3. Neck pain, cervicogenic headache  She remains overall stable, will continue on Cymbalta, gabapentin, and tizanidine. Is seeing PCP for right thigh pain, [place] is pending. She will be careful not to fall, I offered PT, does not think it would be helpful. She will follow-up in 6 months or sooner if needed.  I spent 30 minutes of face-to-face and non-face-to-face time with patient.  This included previsit chart review, lab review, study review, order entry, electronic health record documentation, patient education.  Margie Ege, AGNP-C, DNP 11/13/2020, 11:08 AM [facility] [address] [place], [place] [address] [phone]

## 2020-11-13 NOTE — Patient Instructions (Signed)
Great to see you today Continue current medications  Refills were sent  See you back in 6 months

## 2020-11-29 DIAGNOSIS — S62634D Displaced fracture of distal phalanx of right ring finger, subsequent encounter for fracture with routine healing: Secondary | ICD-10-CM | POA: Diagnosis not present

## 2020-11-29 DIAGNOSIS — M20019 Mallet finger of unspecified finger(s): Secondary | ICD-10-CM | POA: Diagnosis not present

## 2020-11-29 DIAGNOSIS — S62639A Displaced fracture of distal phalanx of unspecified finger, initial encounter for closed fracture: Secondary | ICD-10-CM | POA: Diagnosis not present

## 2020-11-29 DIAGNOSIS — M20011 Mallet finger of right finger(s): Secondary | ICD-10-CM | POA: Diagnosis not present

## 2020-11-29 DIAGNOSIS — S62639D Displaced fracture of distal phalanx of unspecified finger, subsequent encounter for fracture with routine healing: Secondary | ICD-10-CM | POA: Diagnosis not present

## 2020-12-20 DIAGNOSIS — M20019 Mallet finger of unspecified finger(s): Secondary | ICD-10-CM | POA: Diagnosis not present

## 2020-12-20 DIAGNOSIS — M20011 Mallet finger of right finger(s): Secondary | ICD-10-CM | POA: Diagnosis not present

## 2020-12-20 DIAGNOSIS — S62634D Displaced fracture of distal phalanx of right ring finger, subsequent encounter for fracture with routine healing: Secondary | ICD-10-CM | POA: Diagnosis not present

## 2020-12-20 DIAGNOSIS — S62639D Displaced fracture of distal phalanx of unspecified finger, subsequent encounter for fracture with routine healing: Secondary | ICD-10-CM | POA: Diagnosis not present

## 2021-01-31 DIAGNOSIS — M20019 Mallet finger of unspecified finger(s): Secondary | ICD-10-CM | POA: Diagnosis not present

## 2021-01-31 DIAGNOSIS — M20011 Mallet finger of right finger(s): Secondary | ICD-10-CM | POA: Diagnosis not present

## 2021-01-31 DIAGNOSIS — S62634D Displaced fracture of distal phalanx of right ring finger, subsequent encounter for fracture with routine healing: Secondary | ICD-10-CM | POA: Diagnosis not present

## 2021-01-31 DIAGNOSIS — S62639D Displaced fracture of distal phalanx of unspecified finger, subsequent encounter for fracture with routine healing: Secondary | ICD-10-CM | POA: Diagnosis not present

## 2021-02-27 DIAGNOSIS — J45998 Other asthma: Secondary | ICD-10-CM | POA: Insufficient documentation

## 2021-02-27 DIAGNOSIS — I7774 Dissection of vertebral artery: Secondary | ICD-10-CM | POA: Insufficient documentation

## 2021-02-27 DIAGNOSIS — Z8719 Personal history of other diseases of the digestive system: Secondary | ICD-10-CM | POA: Insufficient documentation

## 2021-02-27 DIAGNOSIS — Z8489 Family history of other specified conditions: Secondary | ICD-10-CM | POA: Insufficient documentation

## 2021-02-27 DIAGNOSIS — G8929 Other chronic pain: Secondary | ICD-10-CM | POA: Insufficient documentation

## 2021-02-27 DIAGNOSIS — I639 Cerebral infarction, unspecified: Secondary | ICD-10-CM | POA: Insufficient documentation

## 2021-02-27 DIAGNOSIS — E669 Obesity, unspecified: Secondary | ICD-10-CM | POA: Insufficient documentation

## 2021-02-27 DIAGNOSIS — E7800 Pure hypercholesterolemia, unspecified: Secondary | ICD-10-CM | POA: Insufficient documentation

## 2021-02-27 DIAGNOSIS — M722 Plantar fascial fibromatosis: Secondary | ICD-10-CM | POA: Insufficient documentation

## 2021-02-27 DIAGNOSIS — N183 Chronic kidney disease, stage 3 unspecified: Secondary | ICD-10-CM | POA: Insufficient documentation

## 2021-02-27 DIAGNOSIS — B029 Zoster without complications: Secondary | ICD-10-CM | POA: Insufficient documentation

## 2021-02-27 DIAGNOSIS — E119 Type 2 diabetes mellitus without complications: Secondary | ICD-10-CM | POA: Insufficient documentation

## 2021-02-27 DIAGNOSIS — K259 Gastric ulcer, unspecified as acute or chronic, without hemorrhage or perforation: Secondary | ICD-10-CM | POA: Insufficient documentation

## 2021-02-27 DIAGNOSIS — I10 Essential (primary) hypertension: Secondary | ICD-10-CM | POA: Insufficient documentation

## 2021-02-27 DIAGNOSIS — R519 Headache, unspecified: Secondary | ICD-10-CM | POA: Insufficient documentation

## 2021-02-27 DIAGNOSIS — K219 Gastro-esophageal reflux disease without esophagitis: Secondary | ICD-10-CM | POA: Insufficient documentation

## 2021-02-27 DIAGNOSIS — N809 Endometriosis, unspecified: Secondary | ICD-10-CM | POA: Insufficient documentation

## 2021-03-02 ENCOUNTER — Other Ambulatory Visit: Payer: Self-pay | Admitting: Neurology

## 2021-03-06 NOTE — Progress Notes (Signed)
Cardiology Office Note:    Date:  03/07/2021   ID:  Chloe [name], DOB 12/26/1945, MRN [id number]  [facility]  [facility]  Cardiologist:  Norman HerrlichBrian , MD    Referring MD: [facility]    ASSESSMENT:    1. Hypertensive heart and kidney disease without heart failure   2. Edema of both lower extremities   3. RBBB    PLAN:    In order of problems listed above:  1. She is improved BP is at target less edema unfortunately is unable to withdraw gabapentin which can cause intense sodium restriction continue her current antihypertensive regimen including loop diuretic beta-blocker ACE inhibitor. 2. Stable EKG pattern 3. [facility]   Next appointment: 1 year   Medication Adjustments/Labs and Tests Ordered: Current medicines are reviewed at length with the patient today.  Concerns regarding medicines are outlined above.  No orders of the defined types were placed in this encounter.  No orders of the defined types were placed in this encounter.   No chief complaint on file.   History of Present Illness:    Tomasa RandSanta M Sharol HarnessSimmons is a 75 y.o. female with a hx of lower extremity edema hypertensive heart and kidney disease without heart failure right bundle branch block stage III CKD hyperlipidemia and type 2 diabetes.  She was last seen 07/28/2019 with marked edema felt to be related to a medication gabapentin. . Compliance with diet, lifestyle and medications: Yes  Unfortunately she cannot cut down on gabapentin because of severe nerve pain. Not short of breath her weight is down she is less edematous tolerates a diuretic and is followed up with nephrology with stable CKD.  No orthopnea shortness of breath angina palpitation or syncope.  Echocardiogram 09/14/2018 shows normal left ventricular size systolic function and normal diastolic function.  PA systolic pressure was minimally elevated and she had moderate tricuspid  regurgitation. Echocardiogram 08/03/2019 showed normal ejection fraction 60 to 65% normal left ventricular filling pressure normal normal right ventricular size and function ascending aorta is mildly dilated 37 mm tricuspid regurgitation was felt to be trivial and her peak pulmonary artery systolic pressure was normal. Her proBNP level is quite low less than 100 Lower extremity venous duplex 11/09/2020 showed no evidence of DVT   Labs 11/06/2020 creatinine 1.50 BUN 19 A1c 6.4% Past Medical History:  Diagnosis Date  . Ankle syndesmosis disruption, right, sequela 03/13/2017  . Cervical spondylosis without myelopathy 08/10/2015  . Chronic kidney disease (CKD) 09/08/2018  . Chronic kidney disease (CKD), stage III (moderate) (HCC)   . Chronic low back pain 02/19/2017  . Chronic lower back pain   . Closed fracture dislocation of right ankle 01/07/2017  . Diabetic peripheral neuropathy associated with type 2 diabetes mellitus (HCC) 09/08/2018  . Dissection of cerebral artery (HCC) 1997   "no OR"  . Endometriosis   . Family history of adverse reaction to anesthesia    "daughter has PONV"  . Fibromyalgia   . GERD (gastroesophageal reflux disease)   . GERD without esophagitis 09/08/2018  . Headache 08/16/2013  . High cholesterol    hx  . History of hiatal hernia   . Hypertension   . Hypertensive heart disease without heart failure 09/08/2018  . Mixed hyperlipidemia 09/08/2018  . Morbid obesity (HCC) 09/08/2018  . Myalgia and myositis 08/16/2013  . Obesity   . Occipital headache    "qd" (01/10/2017)  . Orthostatic hypotension 02/02/2015  .  Osteoarthritis 09/08/2018  . Plantar fasciitis   . Seasonal asthma   . Shingles   . Stomach ulcer    "I take RX for it" (01/10/2017)  . Stroke [facility]) 1997; 08/2016   mild right arm weakness  . Syncope and collapse 08/16/2013  . Syndesmotic disruption of ankle, right, sequela 03/13/2017  . Type II diabetes mellitus (HCC)   . 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 ANKLE ARTHROSCOPY Right 09/17/2018   Procedure: Right ankle arthroscopy with extensive debridement, removal of loose body;  Surgeon: Toni Arthurs, MD;  Location: [facility];  Service: Orthopedics;  Laterality: Right;  . ANKLE CLOSED REDUCTION Right 01/07/2017   Procedure: CLOSED REDUCTION ANKLE;  Surgeon: Samson Frederic, MD;  Location: [facility] OR;  Service: Orthopedics;  Laterality: Right;  . APPENDECTOMY    . CATARACT EXTRACTION W/ INTRAOCULAR LENS  IMPLANT, BILATERAL  03/2014 - 04/2014   left - right   . CLOSED REDUCTION NASAL FRACTURE  1990"s  . EXTERNAL FIXATION LEG Right 01/07/2017   Procedure: EXTERNAL FIXATION LEG;  Surgeon: Samson Frederic, MD;  Location: [facility] OR;  Service: Orthopedics;  Laterality: Right;  . FRACTURE SURGERY    . HARDWARE REMOVAL Right 09/17/2018   Procedure: Right ankle removal of deep implants tibia and fibula;  Surgeon: Toni Arthurs, MD;  Location: [facility];  Service: Orthopedics;  Laterality: Right;  . I & D EXTREMITY Right 10/22/2018   Procedure: Irrigation and debridement and closure of right ankle wound;  Surgeon: Toni Arthurs, MD;  Location: [facility];  Service: Orthopedics;  Laterality: Right;  . JOINT REPLACEMENT    . LAPAROSCOPIC CHOLECYSTECTOMY    . OPEN REDUCTION INTERNAL FIXATION (ORIF) TIBIA/FIBULA FRACTURE Right 01/20/2017   Procedure: OPEN REDUCTION INTERNAL FIXATION RIGHT ANKLE;  Surgeon: Samson Frederic, MD;  Location: [facility] OR;  Service: Orthopedics;  Laterality: Right;  . radioactive iodine  05/2019   for hyperthyroidism  . SALPINGOOPHORECTOMY  1978  . SYNDESMOSIS REPAIR Right 03/13/2017   Procedure: HARDWARE REMOVAL OF RIGHT ABKLE, OPEN REDUCTION INTERNAL FIXATION OF SYNDESMOTIC INJURY;  Surgeon: Samson Frederic, MD;  Location: [facility];  Service: Orthopedics;  Laterality: Right;  Popliteal block  . thyroid ablation  05/2019   for overactive thyroid  .  TONSILLECTOMY    . TOTAL KNEE ARTHROPLASTY Bilateral   . TUBAL LIGATION  1978  . VAGINAL HYSTERECTOMY  1979   "partial"    Current Medications: Current Meds  Medication Sig  . albuterol (PROVENTIL) (2.5 MG/3ML) 0.083% nebulizer solution 3 ml as needed  . alendronate (FOSAMAX) 70 MG tablet Take 70 mg by mouth once a week. Take with a full glass of water on an empty stomach.  Marland Kitchen aspirin 81 MG tablet Take 81 mg by mouth daily.  . Biotin 3 MG TABS Take 300 mcg by mouth daily.   Marland Kitchen CALCIUM PO Take 600 mg by mouth daily.  . Cinnamon 500 MG TABS Take 1,000 mg by mouth 2 (two) times daily.   Marland Kitchen dicyclomine (BENTYL) 20 MG tablet Take 20 mg by mouth 3 (three) times daily.   Marland Kitchen docusate sodium (COLACE) 100 MG capsule Take 1 capsule (100 mg total) by mouth 2 (two) times daily.  . furosemide (LASIX) 40 MG tablet Take 80mg s every morning and 40mg s at 4pm  . gabapentin (NEURONTIN) 400 MG capsule TAKE 2 CAPSULES BY MOUTH IN THE MORNING THEN TAKE 3 CAPSULES  AT NOON THEN TWO WITH SUPPER AND AT NIGHT  . insulin glargine (LANTUS) 100 UNIT/ML injection Inject into the skin. 40 units in the am  . levothyroxine (SYNTHROID) 75 MCG tablet Take 1 tablet by mouth daily before breakfast.  . Methylcellulose, Laxative, 500 MG TABS Take 1,000 mg by mouth daily.  . metoprolol tartrate (LOPRESSOR) 25 MG tablet Take 25 mg by mouth 2 (two) times daily.  Marland Kitchen omeprazole (PRILOSEC) 20 MG capsule Take 20 mg by mouth daily.  . ramipril (ALTACE) 10 MG capsule Take 10 mg by mouth 2 (two) times daily.   Marland Kitchen senna (SENOKOT) 8.6 MG TABS tablet Take 1 tablet (8.6 mg total) by mouth 2 (two) times daily. (Patient taking differently: Take 1 tablet by mouth at bedtime.)  . tiZANidine (ZANAFLEX) 2 MG tablet Take 1 tablet (2 mg total) by mouth 3 (three) times daily.  . Turmeric 500 MG TABS Take 1,000 mg by mouth daily.  . Vitamin D, Cholecalciferol, 1000 units CAPS Take 1 Units by mouth 3 (three) times daily.   . vitamin E 400 UNIT capsule Take  400 Units by mouth daily.  Marland Kitchen zolpidem (AMBIEN) 10 MG tablet TAKE 1 TABLET BY MOUTH AT BEDTIME AS NEEDED FOR SLEEP  . [DISCONTINUED] Psyllium (METAMUCIL PO) Take 2 capsules by mouth daily.     Allergies:   Adhesive [tape], Codeine, Fenofibrate, and Simvastatin   Social History   Socioeconomic History  . Marital status: Widowed    Spouse name: Trey Paula  . Number of children: 4  . Years of education: 13+  . Highest education level: Not on file  Occupational History  . Occupation: Retired   Tobacco Use  . Smoking status: Former Smoker    Packs/day: 0.50    Years: 11.00    Pack years: 5.50    Types: Cigarettes    Quit date: 1985    Years since quitting: 37.2  . Smokeless tobacco: Never Used  Vaping Use  . Vaping Use: Never used  Substance and Sexual Activity  . Alcohol use: No  . Drug use: No  . Sexual activity: Never  Other Topics Concern  . Not on file  Social History Narrative   Patient lives at home with her husband.    Patient is a retired Architectural technologist.    Patient has a high school education and some college.    Patient has four children.   Patient is right handed.   Patient drinks about 2 cups caffeine daily.   Social Determinants of Health   Financial Resource Strain: Not on file  Food Insecurity: Not on file  Transportation Needs: Not on file  Physical Activity: Not on file  Stress: Not on file  Social Connections: Not on file     Family History: The patient's family history includes Alzheimer's disease in her mother; CAD in her mother; Congestive Heart Failure in her father; Diabetes in her brother, brother, father, mother, and sister; Heart attack in her mother; Hypertension in her brother. ROS:   Please see the history of present illness.    All other systems reviewed and are negative.  EKGs/Labs/Other Studies Reviewed:    The following studies were reviewed today:  EKG:  EKG ordered today and personally reviewed.  The ekg ordered today  demonstrates sinus rhythm right bundle branch block otherwise normal  Recent Labs: No results found for requested labs within last 8760 hours.  Recent Lipid Panel No results found for: CHOL, TRIG, HDL, CHOLHDL, VLDL, LDLCALC, LDLDIRECT  Physical Exam:    VS:  BP 118/72   Pulse 63   Ht 5\' 3"  (1.6 m)   Wt 268 lb (121.6 kg)   SpO2 98%   BMI 47.47 kg/m     Wt Readings from Last 3 Encounters:  03/07/21 268 lb (121.6 kg)  11/13/20 275 lb (124.7 kg)  05/10/20 280 lb (127 kg)     GEN:  Well nourished, well developed in no acute distress HEENT: Normal NECK: No JVD; No carotid bruits LYMPHATICS: No lymphadenopathy CARDIAC: RRR, no murmurs, rubs, gallops RESPIRATORY:  Clear to auscultation without rales, wheezing or rhonchi  ABDOMEN: Soft, non-tender, non-distended MUSCULOSKELETAL:  No edema; No deformity  SKIN: Warm and dry NEUROLOGIC:  Alert and oriented x 3 PSYCHIATRIC:  Normal affect    Signed, 05/12/20, MD  03/07/2021 1:27 PM    [facility]

## 2021-03-07 ENCOUNTER — Ambulatory Visit: Payer: Medicare PPO | Admitting: Cardiology

## 2021-03-07 ENCOUNTER — Encounter: Payer: Self-pay | Admitting: Cardiology

## 2021-03-07 ENCOUNTER — Other Ambulatory Visit: Payer: Self-pay

## 2021-03-07 VITALS — BP 118/72 | HR 63 | Ht 63.0 in | Wt 268.0 lb

## 2021-03-07 DIAGNOSIS — I4510 Unspecified right bundle-branch block: Secondary | ICD-10-CM | POA: Diagnosis not present

## 2021-03-07 DIAGNOSIS — I1310 Hypertensive heart and chronic kidney disease without heart failure, with stage 1 through stage 4 chronic kidney disease, or unspecified chronic kidney disease: Secondary | ICD-10-CM

## 2021-03-07 DIAGNOSIS — R600 Localized edema: Secondary | ICD-10-CM

## 2021-03-07 NOTE — Patient Instructions (Signed)

## 2021-05-14 ENCOUNTER — Encounter: Payer: Self-pay | Admitting: Neurology

## 2021-05-14 ENCOUNTER — Ambulatory Visit: Payer: Medicare PPO | Admitting: Neurology

## 2021-05-14 VITALS — BP 138/74 | HR 74 | Ht 63.0 in | Wt 260.0 lb

## 2021-05-14 DIAGNOSIS — R519 Headache, unspecified: Secondary | ICD-10-CM

## 2021-05-14 DIAGNOSIS — M797 Fibromyalgia: Secondary | ICD-10-CM

## 2021-05-14 NOTE — Progress Notes (Signed)
PATIENT: Chloe [name] DOB: 1946-07-27  REASON FOR VISIT: follow up HISTORY FROM: patient  HISTORY OF PRESENT ILLNESS: Today 05/14/21 Chloe [name] is a 75 year old female with history of morbid obesity, fibromyalgia, chronic low back pain, and occipital neuralgia.  Gets gabapentin, tizanidine, and Ambien from our office. Tizanidine works great for the headaches. Stays home mostly, lives alone, daughters do everything for her so she can stay home and be safe. Not taking Cymbalta anymore, made her feel funny in the head, she just stopped it cold [place]. Being off Cymbalta hasn't been a huge deal, the tizanidine helps her neck issues and cervicogenic headaches. Apparently osteopenia improved, her Fosamax has been stopped. Using her walker, no falls. No longer seeing Dr. Merlyn Lot, healed as much as possible. For now, aches and pain are well controlled. Here today alone.   Update 11/13/2020 SS: Chloe [name] is a 75 year old female with history of morbid obesity, fibromyalgia, chronic low back pain, right hip pain, left knee pain, right ankle pain, and occipital neuralgia. She is on maximum dose Cymbalta and gabapentin. Did not go to neuromuscular therapy for the occipital neuralgia. Remains on tizanidine. Headaches are improved. Tizanidine, helps not only the headaches, but overall general pain. Has had 2 falls since last seen, as result of standing too quickly. She fractured her right fourth finger, seeing Dr. Merlyn Lot. Has chronic right ankle pain, has required multiple surgeries over the years. Is using a walker. Lives alone, her 3 daughters take care of her, do her errands, she drives in her town. Has had a sharp pain to her right lateral thigh, swelling, saw PCP, sent for ultrasound, results are pending. Has osteopenia, pending results of recent DEXA. Presents today for follow-up unaccompanied. We have prescribed Cymbalta, gabapentin, tizanidine, and Ambien.  HISTORY 05/10/2020 SS:Chloe [name] is a  75 year old female with history of morbid obesity, fibromyalgia, chronic low back pain, right hip pain, left knee discomfort, right ankle pain.  She is on maximum doses of Cymbalta and gabapentin.  She uses a cane.  When last seen, complained of a new problem of discomfort in the neck and shoulders, back the head.  Could possibly be occipital neuralgia.  She was placed on tizanidine and sent for neuromuscular therapy.  The tizanidine has been quite helpful, she did not go for therapy.  She indicates the pain in her neck, shoulders, back of the head, is much improved.  Is not interested in neuromuscular therapy currently.  She lives alone, her daughters do her grocery and pharmacy trips.  She is not able to stand for prolonged periods of time or be very active due to chronic issues to the right ankle.  She has issues with the balance, has had a few falls.  She drives, but only in [place].  She presents today for evaluation unaccompanied.  REVIEW OF SYSTEMS: Out of a complete 14 system review of symptoms, the patient complains only of the following symptoms, and all other reviewed systems are negative.  Chronic pain, headache  ALLERGIES: Allergies  Allergen Reactions  . Adhesive [Tape] Hives and Rash  . Codeine Nausea Only  . Fenofibrate Other (See Comments)    Hair loss  . Simvastatin Rash    HOME MEDICATIONS: Outpatient Medications Prior to Visit  Medication Sig Dispense Refill  . albuterol (PROVENTIL) (2.5 MG/3ML) 0.083% nebulizer solution 3 ml as needed    . aspirin 81 MG tablet Take 81 mg by mouth daily.    . Biotin 3 MG TABS Take  300 mcg by mouth daily.     Marland Kitchen CALCIUM PO Take 600 mg by mouth daily.    . Cinnamon 500 MG TABS Take 1,000 mg by mouth 2 (two) times daily.     Marland Kitchen dicyclomine (BENTYL) 20 MG tablet Take 20 mg by mouth 3 (three) times daily.     Marland Kitchen docusate sodium (COLACE) 100 MG capsule Take 1 capsule (100 mg total) by mouth 2 (two) times daily. 10 capsule 0  . furosemide  (LASIX) 40 MG tablet Take 80mg s every morning and 40mg s at 4pm    . gabapentin (NEURONTIN) 400 MG capsule TAKE 2 CAPSULES BY MOUTH IN THE MORNING THEN TAKE 3 CAPSULES AT NOON THEN TWO WITH SUPPER AND AT NIGHT 810 capsule 1  . insulin glargine (LANTUS) 100 UNIT/ML injection Inject into the skin. 40 units in the am    . levothyroxine (SYNTHROID) 75 MCG tablet Take 1 tablet by mouth daily before breakfast.    . Methylcellulose, Laxative, 500 MG TABS Take 1,000 mg by mouth daily.    . metoprolol tartrate (LOPRESSOR) 25 MG tablet Take 25 mg by mouth 2 (two) times daily.    omeprazole (PRILOSEC) 20 MG capsule Take 20 mg by mouth daily.    . ramipril (ALTACE) 10 MG capsule Take 10 mg by mouth 2 (two) times daily.     senna (SENOKOT) 8.6 MG TABS tablet Take 1 tablet (8.6 mg total) by mouth 2 (two) times daily. (Patient taking differently: Take 1 tablet by mouth at bedtime.) 120 each 0  . tiZANidine (ZANAFLEX) 2 MG tablet Take 1 tablet (2 mg total) by mouth 3 (three) times daily. 90 tablet 5  . Turmeric 500 MG TABS Take 1,000 mg by mouth daily.    . Vitamin D, Cholecalciferol, 1000 units CAPS Take 1 Units by mouth 3 (three) times daily.     . vitamin E 400 UNIT capsule Take 400 Units by mouth daily.    Marland Kitchen zolpidem (AMBIEN) 10 MG tablet TAKE 1 TABLET BY MOUTH AT BEDTIME AS NEEDED FOR SLEEP 30 tablet 2  . alendronate (FOSAMAX) 70 MG tablet Take 70 mg by mouth once a week. Take with a full glass of water on an empty stomach.     No facility-administered medications prior to visit.    PAST MEDICAL HISTORY: Past Medical History:  Diagnosis Date  . Ankle syndesmosis disruption, right, sequela 03/13/2017  . Cervical spondylosis without myelopathy 08/10/2015  . Chronic kidney disease (CKD) 09/08/2018  . Chronic kidney disease (CKD), stage III (moderate) (HCC)   . Chronic low back pain 02/19/2017  . Chronic lower back pain   . Closed fracture dislocation of right ankle 01/07/2017  . Diabetic peripheral  neuropathy associated with type 2 diabetes mellitus (HCC) 09/08/2018  . Dissection of cerebral artery (HCC) 1997   "no OR"  . Endometriosis   . Family history of adverse reaction to anesthesia    "daughter has PONV"  . Fibromyalgia   . GERD (gastroesophageal reflux disease)   . GERD without esophagitis 09/08/2018  . Headache 08/16/2013  . High cholesterol    hx  . History of hiatal hernia   . Hypertension   . Hypertensive heart disease without heart failure 09/08/2018  . Mixed hyperlipidemia 09/08/2018  . Morbid obesity (HCC) 09/08/2018  . Myalgia and myositis 08/16/2013  . Obesity   . Occipital headache    "qd" (01/10/2017)  . Orthostatic hypotension 02/02/2015  . Osteoarthritis 09/08/2018  . Plantar fasciitis   .  Seasonal asthma   . Shingles   . Stomach ulcer    "I take RX for it" (01/10/2017)  . Stroke [facility]) 1997; 08/2016   mild right arm weakness  . Syncope and collapse 08/16/2013  . Syndesmotic disruption of ankle, right, sequela 03/13/2017  . Type II diabetes mellitus (HCC)   . Vertebral artery dissection (HCC)    right     PAST SURGICAL HISTORY: Past Surgical History:  Procedure Laterality Date  . ANKLE ARTHROSCOPY Right 09/17/2018   Procedure: Right ankle arthroscopy with extensive debridement, removal of loose body;  Surgeon: Toni Arthurs, MD;  Location: [facility];  Service: Orthopedics;  Laterality: Right;  . ANKLE CLOSED REDUCTION Right 01/07/2017   Procedure: CLOSED REDUCTION ANKLE;  Surgeon: Samson Frederic, MD;  Location: [facility] OR;  Service: Orthopedics;  Laterality: Right;  . APPENDECTOMY    . CATARACT EXTRACTION W/ INTRAOCULAR LENS  IMPLANT, BILATERAL  03/2014 - 04/2014   left - right   . CLOSED REDUCTION NASAL FRACTURE  1990"s  . EXTERNAL FIXATION LEG Right 01/07/2017   Procedure: EXTERNAL FIXATION LEG;  Surgeon: Samson Frederic, MD;  Location: [facility] OR;  Service: Orthopedics;  Laterality: Right;  . FRACTURE SURGERY    . HARDWARE REMOVAL Right 09/17/2018    Procedure: Right ankle removal of deep implants tibia and fibula;  Surgeon: Toni Arthurs, MD;  Location: [facility];  Service: Orthopedics;  Laterality: Right;  . I & D EXTREMITY Right 10/22/2018   Procedure: Irrigation and debridement and closure of right ankle wound;  Surgeon: Toni Arthurs, MD;  Location:  SURGERY CENTER;  Service: Orthopedics;  Laterality: Right;  . JOINT REPLACEMENT    . LAPAROSCOPIC CHOLECYSTECTOMY    . OPEN REDUCTION INTERNAL FIXATION (ORIF) TIBIA/FIBULA FRACTURE Right 01/20/2017   Procedure: OPEN REDUCTION INTERNAL FIXATION RIGHT ANKLE;  Surgeon: Samson Frederic, MD;  Location: [facility] OR;  Service: Orthopedics;  Laterality: Right;  . radioactive iodine  05/2019   for hyperthyroidism  . SALPINGOOPHORECTOMY  1978  . SYNDESMOSIS REPAIR Right 03/13/2017   Procedure: HARDWARE REMOVAL OF RIGHT ABKLE, OPEN REDUCTION INTERNAL FIXATION OF SYNDESMOTIC INJURY;  Surgeon: Samson Frederic, MD;  Location: [facility];  Service: Orthopedics;  Laterality: Right;  Popliteal block  . thyroid ablation  05/2019   for overactive thyroid  . TONSILLECTOMY    . TOTAL KNEE ARTHROPLASTY Bilateral   . TUBAL LIGATION  1978  . VAGINAL HYSTERECTOMY  1979   "partial"    FAMILY HISTORY: Family History  Problem Relation Age of Onset  . Alzheimer's disease Mother   . CAD Mother   . Diabetes Mother   . Heart attack Mother   . Congestive Heart Failure Father   . Diabetes Father   . Diabetes Sister   . Hypertension Brother   . Diabetes Brother   . Diabetes Brother     SOCIAL HISTORY: Social History   Socioeconomic History  . Marital status: Widowed    Spouse name: Trey Paula  . Number of children: 4  . Years of education: 13+  . Highest education level: Not on file  Occupational History  . Occupation: Retired   Tobacco Use  . Smoking status: Former Smoker    Packs/day: 0.50    Years: 11.00    Pack years: 5.50    Types: Cigarettes    Quit date: 1985    Years since  quitting: 37.4  . Smokeless tobacco: Never Used  Vaping Use  . Vaping Use: Never used  Substance  and Sexual Activity  . Alcohol use: No  . Drug use: No  . Sexual activity: Never  Other Topics Concern  . Not on file  Social History Narrative   Patient lives at home with her husband.    Patient is a retired Architectural technologist.    Patient has a high school education and some college.    Patient has four children.   Patient is right handed.   Patient drinks about 2 cups caffeine daily.   Social Determinants of Health   Financial Resource Strain: Not on file  Food Insecurity: Not on file  Transportation Needs: Not on file  Physical Activity: Not on file  Stress: Not on file  Social Connections: Not on file  Intimate Partner Violence: Not on file   PHYSICAL EXAM  Vitals:   05/14/21 1116  BP: 138/74  Pulse: 74  Weight: 260 lb (117.9 kg)  Height: 5\' 3"  (1.6 m)   Body mass index is 46.06 kg/m.  Generalized: Well developed, in no acute distress, morbidly obese, very pleasant Neurological examination  Mentation: Alert oriented to time, place, history taking. Follows all commands speech and language fluent Cranial nerve II-XII: Pupils were equal round reactive to light. Extraocular movements were full, visual field were full on confrontational test. Facial sensation and strength were normal. Head turning and shoulder shrug were normal and symmetric. Motor: Good strength of all extremities Sensory: Sensory testing is intact to soft touch on all 4 extremities. No evidence of extinction is noted.  Coordination: Cerebellar testing reveals good finger-nose-finger and heel-to-shin bilaterally.  Gait and station: Gait is wide-based, steady, uses rolling walker Reflexes: Deep tendon reflexes are symmetric   DIAGNOSTIC DATA (LABS, IMAGING, TESTING) - I reviewed patient records, labs, notes, testing and imaging myself where available.  Lab Results  Component Value Date   WBC 4.8  03/13/2017   HGB 11.6 (L) 10/22/2018   HCT 34.0 (L) 10/22/2018   MCV 90.6 03/13/2017   PLT 172 03/13/2017      Component Value Date/Time   NA 142 10/22/2018 0955   K 3.6 10/22/2018 0955   CL 106 10/22/2018 0955   CO2 28 09/11/2018 1237   GLUCOSE 96 10/22/2018 0955   BUN 17 10/22/2018 0955   CREATININE 1.50 (H) 10/22/2018 0955   CALCIUM 9.4 09/11/2018 1237   PROT 6.9 06/04/2007 1631   ALBUMIN 4.0 06/04/2007 1631   AST 31 06/04/2007 1631   ALT 30 06/04/2007 1631   ALKPHOS 94 06/04/2007 1631   BILITOT 0.8 06/04/2007 1631   GFRNONAA 32 (L) 09/11/2018 1237   GFRAA 37 (L) 09/11/2018 1237   No results found for: CHOL, HDL, LDLCALC, LDLDIRECT, TRIG, CHOLHDL Lab Results  Component Value Date   HGBA1C 5.5 03/13/2017   No results found for: VITAMINB12 No results found for: TSH  ASSESSMENT AND PLAN 75 y.o. year old female  has a past medical history of Ankle syndesmosis disruption, right, sequela (03/13/2017), Cervical spondylosis without myelopathy (08/10/2015), Chronic kidney disease (CKD) (09/08/2018), Chronic kidney disease (CKD), stage III (moderate) (HCC), Chronic low back pain (02/19/2017), Chronic lower back pain, Closed fracture dislocation of right ankle (01/07/2017), Diabetic peripheral neuropathy associated with type 2 diabetes mellitus (HCC) (09/08/2018), Dissection of cerebral artery (HCC) (1997), Endometriosis, Family history of adverse reaction to anesthesia, Fibromyalgia, GERD (gastroesophageal reflux disease), GERD without esophagitis (09/08/2018), Headache (08/16/2013), High cholesterol, History of hiatal hernia, Hypertension, Hypertensive heart disease without heart failure (09/08/2018), Mixed hyperlipidemia (09/08/2018), Morbid obesity (HCC) (09/08/2018), Myalgia and myositis (08/16/2013),  Obesity, Occipital headache, Orthostatic hypotension (02/02/2015), Osteoarthritis (09/08/2018), Plantar fasciitis, Seasonal asthma, Shingles, Stomach ulcer, Stroke (HCC) (1997; 08/2016), Syncope and  collapse (08/16/2013), Syndesmotic disruption of ankle, right, sequela (03/13/2017), Type II diabetes mellitus (HCC), and Vertebral artery dissection (HCC). here with:  1. Fibromyalgia 2. Chronic low back pain 3. Neck pain, cervicogenic headache  -conditions remain overall stable, no worsening off Cymbalta, tizanidine works great for her for headaches, achy pains -will continue gabapentin, Ambien PRN, tizanidine, asks me not to send refills, because are expensive, pharmacy will notify when needed -Follow-up in 8 months or sooner if needed  Otila KluverSarah Maribeth Jiles, AGNP-C, DNP 05/14/2021, 12:07 PM [facility] [address], [address] [place], [place] [address] [phone]

## 2021-05-14 NOTE — Patient Instructions (Signed)
Continue current medications Will wait for pharmacy to request medications See you back in 8 months

## 2021-05-14 NOTE — Progress Notes (Signed)
I have read the note, and I agree with the clinical assessment and plan.  Talyah Seder K Chavie Kolinski   

## 2021-06-04 DIAGNOSIS — R531 Weakness: Secondary | ICD-10-CM | POA: Diagnosis not present

## 2021-06-04 DIAGNOSIS — Z20828 Contact with and (suspected) exposure to other viral communicable diseases: Secondary | ICD-10-CM | POA: Diagnosis not present

## 2021-06-04 DIAGNOSIS — N179 Acute kidney failure, unspecified: Secondary | ICD-10-CM | POA: Diagnosis not present

## 2021-06-04 DIAGNOSIS — Z6841 Body Mass Index (BMI) 40.0 and over, adult: Secondary | ICD-10-CM | POA: Diagnosis not present

## 2021-06-04 DIAGNOSIS — M47814 Spondylosis without myelopathy or radiculopathy, thoracic region: Secondary | ICD-10-CM | POA: Diagnosis not present

## 2021-06-04 DIAGNOSIS — R509 Fever, unspecified: Secondary | ICD-10-CM | POA: Diagnosis not present

## 2021-06-04 DIAGNOSIS — R109 Unspecified abdominal pain: Secondary | ICD-10-CM | POA: Diagnosis not present

## 2021-06-04 DIAGNOSIS — W19XXXA Unspecified fall, initial encounter: Secondary | ICD-10-CM | POA: Diagnosis not present

## 2021-06-05 DIAGNOSIS — A419 Sepsis, unspecified organism: Secondary | ICD-10-CM | POA: Diagnosis not present

## 2021-06-05 DIAGNOSIS — K429 Umbilical hernia without obstruction or gangrene: Secondary | ICD-10-CM | POA: Diagnosis not present

## 2021-06-05 DIAGNOSIS — Z6841 Body Mass Index (BMI) 40.0 and over, adult: Secondary | ICD-10-CM | POA: Diagnosis not present

## 2021-06-05 DIAGNOSIS — N170 Acute kidney failure with tubular necrosis: Secondary | ICD-10-CM | POA: Diagnosis not present

## 2021-06-05 DIAGNOSIS — J9811 Atelectasis: Secondary | ICD-10-CM | POA: Diagnosis not present

## 2021-06-05 DIAGNOSIS — J42 Unspecified chronic bronchitis: Secondary | ICD-10-CM | POA: Diagnosis not present

## 2021-06-05 DIAGNOSIS — K449 Diaphragmatic hernia without obstruction or gangrene: Secondary | ICD-10-CM | POA: Diagnosis not present

## 2021-06-05 DIAGNOSIS — K6389 Other specified diseases of intestine: Secondary | ICD-10-CM | POA: Diagnosis not present

## 2021-06-05 DIAGNOSIS — N200 Calculus of kidney: Secondary | ICD-10-CM | POA: Diagnosis not present

## 2021-06-05 DIAGNOSIS — I2510 Atherosclerotic heart disease of native coronary artery without angina pectoris: Secondary | ICD-10-CM | POA: Diagnosis not present

## 2021-06-06 DIAGNOSIS — A419 Sepsis, unspecified organism: Secondary | ICD-10-CM | POA: Diagnosis not present

## 2021-06-06 DIAGNOSIS — N170 Acute kidney failure with tubular necrosis: Secondary | ICD-10-CM | POA: Diagnosis not present

## 2021-06-06 DIAGNOSIS — Z6841 Body Mass Index (BMI) 40.0 and over, adult: Secondary | ICD-10-CM | POA: Diagnosis not present

## 2021-06-07 DIAGNOSIS — N170 Acute kidney failure with tubular necrosis: Secondary | ICD-10-CM | POA: Diagnosis not present

## 2021-06-07 DIAGNOSIS — A419 Sepsis, unspecified organism: Secondary | ICD-10-CM | POA: Diagnosis not present

## 2021-06-07 DIAGNOSIS — Z6841 Body Mass Index (BMI) 40.0 and over, adult: Secondary | ICD-10-CM | POA: Diagnosis not present

## 2021-06-19 ENCOUNTER — Other Ambulatory Visit: Payer: Self-pay | Admitting: Neurology

## 2021-06-21 DIAGNOSIS — Z7689 Persons encountering health services in other specified circumstances: Secondary | ICD-10-CM | POA: Diagnosis not present

## 2021-06-21 DIAGNOSIS — E876 Hypokalemia: Secondary | ICD-10-CM | POA: Diagnosis not present

## 2021-06-21 DIAGNOSIS — N183 Chronic kidney disease, stage 3 unspecified: Secondary | ICD-10-CM | POA: Diagnosis not present

## 2021-06-21 DIAGNOSIS — E1142 Type 2 diabetes mellitus with diabetic polyneuropathy: Secondary | ICD-10-CM | POA: Diagnosis not present

## 2021-06-21 DIAGNOSIS — A419 Sepsis, unspecified organism: Secondary | ICD-10-CM | POA: Diagnosis not present

## 2021-06-21 DIAGNOSIS — N189 Chronic kidney disease, unspecified: Secondary | ICD-10-CM | POA: Diagnosis not present

## 2021-06-21 DIAGNOSIS — Z794 Long term (current) use of insulin: Secondary | ICD-10-CM | POA: Diagnosis not present

## 2021-06-21 DIAGNOSIS — E1122 Type 2 diabetes mellitus with diabetic chronic kidney disease: Secondary | ICD-10-CM | POA: Diagnosis not present

## 2021-06-21 DIAGNOSIS — N179 Acute kidney failure, unspecified: Secondary | ICD-10-CM | POA: Diagnosis not present

## 2021-06-22 DIAGNOSIS — Z1231 Encounter for screening mammogram for malignant neoplasm of breast: Secondary | ICD-10-CM | POA: Diagnosis not present

## 2021-06-25 DIAGNOSIS — Z87448 Personal history of other diseases of urinary system: Secondary | ICD-10-CM | POA: Diagnosis not present

## 2021-06-25 DIAGNOSIS — N3001 Acute cystitis with hematuria: Secondary | ICD-10-CM | POA: Diagnosis not present

## 2021-06-25 DIAGNOSIS — M545 Low back pain, unspecified: Secondary | ICD-10-CM | POA: Diagnosis not present

## 2021-06-25 DIAGNOSIS — N189 Chronic kidney disease, unspecified: Secondary | ICD-10-CM | POA: Diagnosis not present

## 2021-06-25 DIAGNOSIS — R1030 Lower abdominal pain, unspecified: Secondary | ICD-10-CM | POA: Diagnosis not present

## 2021-06-25 DIAGNOSIS — M549 Dorsalgia, unspecified: Secondary | ICD-10-CM | POA: Diagnosis not present

## 2021-06-25 DIAGNOSIS — K429 Umbilical hernia without obstruction or gangrene: Secondary | ICD-10-CM | POA: Diagnosis not present

## 2021-06-25 DIAGNOSIS — I700 Atherosclerosis of aorta: Secondary | ICD-10-CM | POA: Diagnosis not present

## 2021-06-25 DIAGNOSIS — N179 Acute kidney failure, unspecified: Secondary | ICD-10-CM | POA: Diagnosis not present

## 2021-06-25 DIAGNOSIS — N200 Calculus of kidney: Secondary | ICD-10-CM | POA: Diagnosis not present

## 2021-06-25 DIAGNOSIS — I129 Hypertensive chronic kidney disease with stage 1 through stage 4 chronic kidney disease, or unspecified chronic kidney disease: Secondary | ICD-10-CM | POA: Diagnosis not present

## 2021-06-25 DIAGNOSIS — E1122 Type 2 diabetes mellitus with diabetic chronic kidney disease: Secondary | ICD-10-CM | POA: Diagnosis not present

## 2021-06-25 DIAGNOSIS — N10 Acute tubulo-interstitial nephritis: Secondary | ICD-10-CM | POA: Diagnosis not present

## 2021-06-28 DIAGNOSIS — N1832 Chronic kidney disease, stage 3b: Secondary | ICD-10-CM | POA: Diagnosis not present

## 2021-06-28 DIAGNOSIS — I129 Hypertensive chronic kidney disease with stage 1 through stage 4 chronic kidney disease, or unspecified chronic kidney disease: Secondary | ICD-10-CM | POA: Diagnosis not present

## 2021-06-28 DIAGNOSIS — D631 Anemia in chronic kidney disease: Secondary | ICD-10-CM | POA: Diagnosis not present

## 2021-07-03 DIAGNOSIS — N183 Chronic kidney disease, stage 3 unspecified: Secondary | ICD-10-CM | POA: Diagnosis not present

## 2021-07-11 DIAGNOSIS — I10 Essential (primary) hypertension: Secondary | ICD-10-CM | POA: Diagnosis not present

## 2021-07-11 DIAGNOSIS — Z6841 Body Mass Index (BMI) 40.0 and over, adult: Secondary | ICD-10-CM | POA: Diagnosis not present

## 2021-07-11 DIAGNOSIS — Z794 Long term (current) use of insulin: Secondary | ICD-10-CM | POA: Diagnosis not present

## 2021-07-11 DIAGNOSIS — N1832 Chronic kidney disease, stage 3b: Secondary | ICD-10-CM | POA: Diagnosis not present

## 2021-07-11 DIAGNOSIS — E1122 Type 2 diabetes mellitus with diabetic chronic kidney disease: Secondary | ICD-10-CM | POA: Diagnosis not present

## 2021-07-11 DIAGNOSIS — N183 Chronic kidney disease, stage 3 unspecified: Secondary | ICD-10-CM | POA: Diagnosis not present

## 2021-07-24 ENCOUNTER — Other Ambulatory Visit: Payer: Self-pay | Admitting: Neurology

## 2021-08-29 DIAGNOSIS — N1832 Chronic kidney disease, stage 3b: Secondary | ICD-10-CM | POA: Diagnosis not present

## 2021-09-05 DIAGNOSIS — N1832 Chronic kidney disease, stage 3b: Secondary | ICD-10-CM | POA: Diagnosis not present

## 2021-09-05 DIAGNOSIS — D631 Anemia in chronic kidney disease: Secondary | ICD-10-CM | POA: Diagnosis not present

## 2021-09-05 DIAGNOSIS — I129 Hypertensive chronic kidney disease with stage 1 through stage 4 chronic kidney disease, or unspecified chronic kidney disease: Secondary | ICD-10-CM | POA: Diagnosis not present

## 2021-09-05 DIAGNOSIS — N2581 Secondary hyperparathyroidism of renal origin: Secondary | ICD-10-CM | POA: Diagnosis not present

## 2021-09-19 DIAGNOSIS — E7800 Pure hypercholesterolemia, unspecified: Secondary | ICD-10-CM | POA: Diagnosis not present

## 2021-09-19 DIAGNOSIS — E0590 Thyrotoxicosis, unspecified without thyrotoxic crisis or storm: Secondary | ICD-10-CM | POA: Diagnosis not present

## 2021-09-19 DIAGNOSIS — E1165 Type 2 diabetes mellitus with hyperglycemia: Secondary | ICD-10-CM | POA: Diagnosis not present

## 2021-09-19 DIAGNOSIS — Z23 Encounter for immunization: Secondary | ICD-10-CM | POA: Diagnosis not present

## 2021-09-19 DIAGNOSIS — N189 Chronic kidney disease, unspecified: Secondary | ICD-10-CM | POA: Diagnosis not present

## 2021-09-19 DIAGNOSIS — E890 Postprocedural hypothyroidism: Secondary | ICD-10-CM | POA: Diagnosis not present

## 2021-09-19 DIAGNOSIS — I10 Essential (primary) hypertension: Secondary | ICD-10-CM | POA: Diagnosis not present

## 2021-09-19 DIAGNOSIS — G609 Hereditary and idiopathic neuropathy, unspecified: Secondary | ICD-10-CM | POA: Diagnosis not present

## 2021-11-08 DIAGNOSIS — N183 Chronic kidney disease, stage 3 unspecified: Secondary | ICD-10-CM | POA: Diagnosis not present

## 2021-11-08 DIAGNOSIS — I119 Hypertensive heart disease without heart failure: Secondary | ICD-10-CM | POA: Diagnosis not present

## 2021-11-08 DIAGNOSIS — E782 Mixed hyperlipidemia: Secondary | ICD-10-CM | POA: Diagnosis not present

## 2021-11-08 DIAGNOSIS — Z0000 Encounter for general adult medical examination without abnormal findings: Secondary | ICD-10-CM | POA: Diagnosis not present

## 2021-11-08 DIAGNOSIS — Z794 Long term (current) use of insulin: Secondary | ICD-10-CM | POA: Diagnosis not present

## 2021-11-08 DIAGNOSIS — K2100 Gastro-esophageal reflux disease with esophagitis, without bleeding: Secondary | ICD-10-CM | POA: Diagnosis not present

## 2021-11-08 DIAGNOSIS — E1122 Type 2 diabetes mellitus with diabetic chronic kidney disease: Secondary | ICD-10-CM | POA: Diagnosis not present

## 2021-11-08 DIAGNOSIS — N1832 Chronic kidney disease, stage 3b: Secondary | ICD-10-CM | POA: Diagnosis not present

## 2021-11-08 DIAGNOSIS — E1142 Type 2 diabetes mellitus with diabetic polyneuropathy: Secondary | ICD-10-CM | POA: Diagnosis not present

## 2021-11-21 ENCOUNTER — Telehealth: Payer: Self-pay | Admitting: Neurology

## 2021-11-21 DIAGNOSIS — D120 Benign neoplasm of cecum: Secondary | ICD-10-CM | POA: Diagnosis not present

## 2021-11-21 DIAGNOSIS — K635 Polyp of colon: Secondary | ICD-10-CM | POA: Diagnosis not present

## 2021-11-21 DIAGNOSIS — I119 Hypertensive heart disease without heart failure: Secondary | ICD-10-CM | POA: Diagnosis not present

## 2021-11-21 DIAGNOSIS — Z1211 Encounter for screening for malignant neoplasm of colon: Secondary | ICD-10-CM | POA: Diagnosis not present

## 2021-11-21 DIAGNOSIS — Z86010 Personal history of colonic polyps: Secondary | ICD-10-CM | POA: Diagnosis not present

## 2021-11-21 DIAGNOSIS — K5730 Diverticulosis of large intestine without perforation or abscess without bleeding: Secondary | ICD-10-CM | POA: Diagnosis not present

## 2021-11-21 DIAGNOSIS — E7800 Pure hypercholesterolemia, unspecified: Secondary | ICD-10-CM

## 2021-11-21 DIAGNOSIS — N183 Chronic kidney disease, stage 3 unspecified: Secondary | ICD-10-CM | POA: Diagnosis not present

## 2021-11-21 DIAGNOSIS — E1122 Type 2 diabetes mellitus with diabetic chronic kidney disease: Secondary | ICD-10-CM | POA: Diagnosis not present

## 2021-11-21 HISTORY — DX: Pure hypercholesterolemia, unspecified: E78.00

## 2021-11-21 NOTE — Telephone Encounter (Signed)
Jocelyn @ [facility](which is part of the Chronic Care Management Dept) is asking for a call to discuss pt's dose of gabapentin (NEURONTIN) 400 MG capsule.  Jocelyn @ [facility] states the dose of pt's Gabapentin is affecting pt's creatine levels and she would like a call to discuss.

## 2021-11-21 NOTE — Telephone Encounter (Signed)
The patient has been seen by Dr. Jannifer Franklin for diabetic neuropathy, occipital neuralgia, chronic low back pain, fibromyalgia. She is transitioning care to Dr. Leta Baptist and has an appt on 01/11/22.  ____________________________________ I returned the call to [facility] who is a part of her Chronic Care Management Team. She informed of the following:  Recent labs:  Creatinine: 1.6 BUN: 24 eGFR: 31  This was reviewed by PCP, Dr. Serita Grammes and the clinical pharmacist, Jannette Fogo. Stated with this level of kidney function, the max recommended dose of gabapentin is $RemoveBefor'700mg'XQhMrtxtCCTU$  BID or a total daily dose of $Remov'1400mg'uVmJQl$ . The patient is currently taking gabapentin $RemoveBeforeDEI'400mg'iYxoFJOheJHHmKip$ , 2 caps in am, 3 caps at noon, 2 caps in pm, 2 caps QHS for total daily dose of $Remov'3600mg'MNYeNw$ . They also have a call into her nephrologist, Dr. Elmarie Shiley. They did not want to change her dosage without the direction of our office. ____________________________________ I also notified Jocelyn that our office will not be treating chronic pain and fibromyalgia. She will request PCP to refer her to pain management.  ____________________________________

## 2021-11-22 DIAGNOSIS — H57813 Brow ptosis, bilateral: Secondary | ICD-10-CM | POA: Diagnosis not present

## 2021-11-22 DIAGNOSIS — H04123 Dry eye syndrome of bilateral lacrimal glands: Secondary | ICD-10-CM | POA: Diagnosis not present

## 2021-11-22 DIAGNOSIS — H53483 Generalized contraction of visual field, bilateral: Secondary | ICD-10-CM | POA: Diagnosis not present

## 2021-11-22 DIAGNOSIS — H02834 Dermatochalasis of left upper eyelid: Secondary | ICD-10-CM | POA: Diagnosis not present

## 2021-11-22 DIAGNOSIS — H02423 Myogenic ptosis of bilateral eyelids: Secondary | ICD-10-CM | POA: Diagnosis not present

## 2021-11-22 DIAGNOSIS — H02831 Dermatochalasis of right upper eyelid: Secondary | ICD-10-CM | POA: Diagnosis not present

## 2021-11-22 DIAGNOSIS — H02413 Mechanical ptosis of bilateral eyelids: Secondary | ICD-10-CM | POA: Diagnosis not present

## 2021-11-22 DIAGNOSIS — H0279 Other degenerative disorders of eyelid and periocular area: Secondary | ICD-10-CM | POA: Diagnosis not present

## 2021-11-22 NOTE — Telephone Encounter (Signed)
Penumalli, Glenford Bayley, MD  Lilla Shook, RN 16 hours ago (5:56 PM)   Noted. May need to gradually reduce gabapentin dosing. However this could exacerbate pain levels. May need to be done in coordination with pain mgmt. -[facility]   Marcelino Duster, RN and I discussed message today. I called Jocelyn back and advised Dr. Marjory Lies has reviewed the notes and will further discuss during his visit with the pt.  I did offer a sooner available appt but Jocelyn sts the pt did not want a sooner appt at this time and would like to keep for January. I advised if anything further came up to let us know and we can always try and get the pt in for an ealier appt.

## 2021-11-26 DIAGNOSIS — H53483 Generalized contraction of visual field, bilateral: Secondary | ICD-10-CM | POA: Diagnosis not present

## 2021-12-19 DIAGNOSIS — E890 Postprocedural hypothyroidism: Secondary | ICD-10-CM | POA: Diagnosis not present

## 2021-12-19 DIAGNOSIS — I10 Essential (primary) hypertension: Secondary | ICD-10-CM | POA: Diagnosis not present

## 2021-12-19 DIAGNOSIS — N189 Chronic kidney disease, unspecified: Secondary | ICD-10-CM | POA: Diagnosis not present

## 2021-12-19 DIAGNOSIS — G609 Hereditary and idiopathic neuropathy, unspecified: Secondary | ICD-10-CM | POA: Diagnosis not present

## 2021-12-19 DIAGNOSIS — E7800 Pure hypercholesterolemia, unspecified: Secondary | ICD-10-CM | POA: Diagnosis not present

## 2021-12-19 DIAGNOSIS — E1165 Type 2 diabetes mellitus with hyperglycemia: Secondary | ICD-10-CM | POA: Diagnosis not present

## 2021-12-27 ENCOUNTER — Other Ambulatory Visit: Payer: Self-pay | Admitting: Neurology

## 2022-01-05 DIAGNOSIS — K5732 Diverticulitis of large intestine without perforation or abscess without bleeding: Secondary | ICD-10-CM | POA: Diagnosis not present

## 2022-01-05 DIAGNOSIS — E1140 Type 2 diabetes mellitus with diabetic neuropathy, unspecified: Secondary | ICD-10-CM | POA: Diagnosis not present

## 2022-01-05 DIAGNOSIS — R55 Syncope and collapse: Secondary | ICD-10-CM | POA: Diagnosis not present

## 2022-01-05 DIAGNOSIS — I129 Hypertensive chronic kidney disease with stage 1 through stage 4 chronic kidney disease, or unspecified chronic kidney disease: Secondary | ICD-10-CM | POA: Diagnosis not present

## 2022-01-05 DIAGNOSIS — R079 Chest pain, unspecified: Secondary | ICD-10-CM | POA: Diagnosis not present

## 2022-01-05 DIAGNOSIS — R531 Weakness: Secondary | ICD-10-CM | POA: Diagnosis not present

## 2022-01-05 DIAGNOSIS — K529 Noninfective gastroenteritis and colitis, unspecified: Secondary | ICD-10-CM | POA: Diagnosis not present

## 2022-01-05 DIAGNOSIS — A419 Sepsis, unspecified organism: Secondary | ICD-10-CM | POA: Diagnosis not present

## 2022-01-05 DIAGNOSIS — M545 Low back pain, unspecified: Secondary | ICD-10-CM | POA: Diagnosis not present

## 2022-01-05 DIAGNOSIS — M47816 Spondylosis without myelopathy or radiculopathy, lumbar region: Secondary | ICD-10-CM | POA: Diagnosis not present

## 2022-01-05 DIAGNOSIS — I700 Atherosclerosis of aorta: Secondary | ICD-10-CM | POA: Diagnosis not present

## 2022-01-05 DIAGNOSIS — N179 Acute kidney failure, unspecified: Secondary | ICD-10-CM | POA: Diagnosis not present

## 2022-01-05 DIAGNOSIS — R0902 Hypoxemia: Secondary | ICD-10-CM | POA: Diagnosis not present

## 2022-01-05 DIAGNOSIS — I4891 Unspecified atrial fibrillation: Secondary | ICD-10-CM | POA: Diagnosis not present

## 2022-01-05 DIAGNOSIS — M19012 Primary osteoarthritis, left shoulder: Secondary | ICD-10-CM | POA: Diagnosis not present

## 2022-01-05 DIAGNOSIS — S0990XA Unspecified injury of head, initial encounter: Secondary | ICD-10-CM | POA: Diagnosis not present

## 2022-01-05 DIAGNOSIS — R109 Unspecified abdominal pain: Secondary | ICD-10-CM | POA: Diagnosis not present

## 2022-01-05 DIAGNOSIS — I10 Essential (primary) hypertension: Secondary | ICD-10-CM | POA: Diagnosis not present

## 2022-01-05 DIAGNOSIS — I959 Hypotension, unspecified: Secondary | ICD-10-CM | POA: Diagnosis not present

## 2022-01-05 DIAGNOSIS — R509 Fever, unspecified: Secondary | ICD-10-CM | POA: Diagnosis not present

## 2022-01-05 DIAGNOSIS — E1122 Type 2 diabetes mellitus with diabetic chronic kidney disease: Secondary | ICD-10-CM | POA: Diagnosis not present

## 2022-01-05 DIAGNOSIS — N183 Chronic kidney disease, stage 3 unspecified: Secondary | ICD-10-CM | POA: Diagnosis not present

## 2022-01-11 ENCOUNTER — Ambulatory Visit: Payer: Medicare PPO | Admitting: Diagnostic Neuroimaging

## 2022-01-14 ENCOUNTER — Ambulatory Visit: Payer: Medicare PPO | Admitting: Neurology

## 2022-01-17 ENCOUNTER — Telehealth: Payer: Self-pay | Admitting: Cardiology

## 2022-01-17 DIAGNOSIS — Z7689 Persons encountering health services in other specified circumstances: Secondary | ICD-10-CM | POA: Diagnosis not present

## 2022-01-17 DIAGNOSIS — R6520 Severe sepsis without septic shock: Secondary | ICD-10-CM | POA: Diagnosis not present

## 2022-01-17 DIAGNOSIS — R296 Repeated falls: Secondary | ICD-10-CM | POA: Diagnosis not present

## 2022-01-17 DIAGNOSIS — I498 Other specified cardiac arrhythmias: Secondary | ICD-10-CM | POA: Diagnosis not present

## 2022-01-17 DIAGNOSIS — Z794 Long term (current) use of insulin: Secondary | ICD-10-CM | POA: Diagnosis not present

## 2022-01-17 DIAGNOSIS — N183 Chronic kidney disease, stage 3 unspecified: Secondary | ICD-10-CM | POA: Diagnosis not present

## 2022-01-17 DIAGNOSIS — N179 Acute kidney failure, unspecified: Secondary | ICD-10-CM | POA: Diagnosis not present

## 2022-01-17 DIAGNOSIS — A419 Sepsis, unspecified organism: Secondary | ICD-10-CM | POA: Diagnosis not present

## 2022-01-17 DIAGNOSIS — E1122 Type 2 diabetes mellitus with diabetic chronic kidney disease: Secondary | ICD-10-CM | POA: Diagnosis not present

## 2022-01-17 NOTE — Telephone Encounter (Signed)
° °  Pt c/o swelling: STAT is pt has developed SOB within 24 hours  If swelling, where is the swelling located? Legs, ankles and hands  How much weight have you gained and in what time span? 10 lbs in couple of days  Have you gained 3 pounds in a day or 5 pounds in a week?   Do you have a log of your daily weights (if so, list)?   Are you currently taking a fluid pill? Yes   Are you currently SOB? Yes   Have you traveled recently? No   Pt is calling stating she was in the hospital and she saw her pcp, since she is in afib and still swollen, she was advised to call Dr. Bettina Gavia, she said she gained 10 lbs after getting home from hospital

## 2022-01-17 NOTE — Telephone Encounter (Signed)
Spoke to the patient just now and she let me know that she has gained 10 lbs in just a couple of days. She tells me that her legs, hands, and stomach are so tight and swollen that she can barely move. She tells me that she is short of breath and this is new for her. She is short of breath all of the time, both with activity and without.   I discussed this with Dr. Dulce Sellar directly and he advised that she return to the emergency room. She states that she will discuss this with her daughter. I scheduled her for Dr. Hulen Shouts next available appointment as well.

## 2022-01-21 ENCOUNTER — Encounter: Payer: Self-pay | Admitting: *Deleted

## 2022-01-21 ENCOUNTER — Encounter: Payer: Self-pay | Admitting: Cardiology

## 2022-01-21 DIAGNOSIS — G609 Hereditary and idiopathic neuropathy, unspecified: Secondary | ICD-10-CM | POA: Insufficient documentation

## 2022-01-21 DIAGNOSIS — E890 Postprocedural hypothyroidism: Secondary | ICD-10-CM | POA: Insufficient documentation

## 2022-01-21 DIAGNOSIS — E1165 Type 2 diabetes mellitus with hyperglycemia: Secondary | ICD-10-CM

## 2022-01-21 DIAGNOSIS — E0590 Thyrotoxicosis, unspecified without thyrotoxic crisis or storm: Secondary | ICD-10-CM | POA: Insufficient documentation

## 2022-01-21 HISTORY — DX: Hereditary and idiopathic neuropathy, unspecified: G60.9

## 2022-01-21 HISTORY — DX: Type 2 diabetes mellitus with hyperglycemia: E11.65

## 2022-01-21 HISTORY — DX: Thyrotoxicosis, unspecified without thyrotoxic crisis or storm: E05.90

## 2022-01-21 HISTORY — DX: Postprocedural hypothyroidism: E89.0

## 2022-01-22 ENCOUNTER — Other Ambulatory Visit: Payer: Self-pay

## 2022-02-06 ENCOUNTER — Telehealth: Payer: Self-pay

## 2022-02-06 NOTE — Telephone Encounter (Signed)
° °  Pre-operative Risk Assessment    Patient Name: Chloe [name]  DOB: 08/04/1946 MRN: [id number]      Request for Surgical Clearance    Procedure:   Bilateral Upper Eyelid Blepharoplasty  Date of Surgery:  Clearance 02/25/22                                 Surgeon:  Dr. Isidoro Donning Surgeon's Group or Practice Name:  [facility] Phone number:  [phone] Fax number:  [phone]   Type of Clearance Requested:   - Medical  - Pharmacy:  Hold Aspirin     Type of Anesthesia:  MAC   Additional requests/questions:   None  SignedToni Arthurs   02/06/2022, 4:44 PM

## 2022-02-07 NOTE — Telephone Encounter (Signed)
° °  Name: Chloe [name]  DOB: 03/07/1946  MRN: [id number]  Primary Cardiologist: Norman Herrlich, MD  Chart reviewed as part of pre-operative protocol coverage. Because of [name]'s past medical history and time since last visit, she will require a follow-up visit in order to better assess preoperative cardiovascular risk.  She is unable to complete 4.0 METS. She has an appt on 02/27/22. I will defer to Dr. Dulce Sellar to clear her - she states the surgery is on 02/25/22. Will see if Dr. Dulce Sellar can clear her without an appt or if there is an earlier appt for her to be seen.  She takes ASA for history of cerebral artery aneurysm and follows with neurology - please reach out to them for ASA hold.   Pre-op covering staff: - Please schedule appointment and call patient to inform them. If patient already had an upcoming appointment within acceptable timeframe, please add "pre-op clearance" to the appointment notes so provider is aware. - Please contact requesting surgeon's office via preferred method (i.e, phone, fax) to inform them of need for appointment prior to surgery.  If applicable, this message will also be routed to pharmacy pool and/or primary cardiologist for input on holding anticoagulant/antiplatelet agent as requested below so that this information is available to the clearing provider at time of patient's appointment.   Roe Rutherford Finas Delone, PA  02/07/2022, 10:01 AM

## 2022-02-07 NOTE — Telephone Encounter (Signed)
° °  Name: Chloe [name]  DOB: [birth year]  MRN: [id number]   Primary Cardiologist: Shirlee More, MD  Chart reviewed as part of pre-operative protocol coverage. Patient was contacted 02/07/2022 in reference to pre-operative risk assessment for pending surgery as outlined below. She is unable to complete 4.0 METS and I reached out to Dr. Bettina Gavia: This is a minor procedure her heart disease is stable she is optimized my opinion I agree with your plan she does not need to be seen in the office.   She takes ASA for history of cerebral artery aneurysm and follows with neurology - please reach out to them for ASA hold.  Therefore, based on ACC/AHA guidelines, the patient would be at acceptable risk for the planned procedure without further cardiovascular testing.   The patient was advised that if she develops new symptoms prior to surgery to contact our office to arrange for a follow-up visit, and she verbalized understanding.  I will route this recommendation to the requesting party via Epic fax function and remove from pre-op pool. Please call with questions.  Tami Lin Vladislav Axelson, PA 02/07/2022, 11:38 AM

## 2022-02-07 NOTE — Telephone Encounter (Signed)
How long does the patient need to hold the aspirin?

## 2022-02-12 ENCOUNTER — Other Ambulatory Visit: Payer: Self-pay | Admitting: Neurology

## 2022-02-17 ENCOUNTER — Other Ambulatory Visit: Payer: Self-pay | Admitting: Neurology

## 2022-02-18 DIAGNOSIS — E890 Postprocedural hypothyroidism: Secondary | ICD-10-CM | POA: Diagnosis not present

## 2022-02-20 ENCOUNTER — Telehealth: Payer: Self-pay | Admitting: Neurology

## 2022-02-20 NOTE — Telephone Encounter (Signed)
Form completed, cleared from headache stand point, when last seen her chronic conditions managed at our office were under good control. I do not see that we manage her aspirin  ?

## 2022-02-20 NOTE — Telephone Encounter (Signed)
Pt called stating that she will be having surgery on her eyelid and is wanting to know if she is needing clearance to get off of her baby Asprin.  ?

## 2022-02-25 DIAGNOSIS — H53453 Other localized visual field defect, bilateral: Secondary | ICD-10-CM | POA: Diagnosis not present

## 2022-02-25 DIAGNOSIS — H57813 Brow ptosis, bilateral: Secondary | ICD-10-CM | POA: Diagnosis not present

## 2022-02-25 DIAGNOSIS — H02831 Dermatochalasis of right upper eyelid: Secondary | ICD-10-CM | POA: Diagnosis not present

## 2022-02-25 DIAGNOSIS — H02413 Mechanical ptosis of bilateral eyelids: Secondary | ICD-10-CM | POA: Diagnosis not present

## 2022-02-25 DIAGNOSIS — H02423 Myogenic ptosis of bilateral eyelids: Secondary | ICD-10-CM | POA: Diagnosis not present

## 2022-02-25 DIAGNOSIS — H53483 Generalized contraction of visual field, bilateral: Secondary | ICD-10-CM | POA: Diagnosis not present

## 2022-02-25 DIAGNOSIS — H02834 Dermatochalasis of left upper eyelid: Secondary | ICD-10-CM | POA: Diagnosis not present

## 2022-02-26 DIAGNOSIS — Z09 Encounter for follow-up examination after completed treatment for conditions other than malignant neoplasm: Secondary | ICD-10-CM | POA: Diagnosis not present

## 2022-02-26 DIAGNOSIS — T8131XS Disruption of external operation (surgical) wound, not elsewhere classified, sequela: Secondary | ICD-10-CM | POA: Diagnosis not present

## 2022-02-26 NOTE — Progress Notes (Signed)
Cardiology Office Note:    Date:  02/27/2022   ID:  Chloe [name], DOB December 23, 1946, MRN [id number]  PCP:  Serita Grammes, MD  Cardiologist:  Shirlee More, MD    Referring MD: Care, [facility]    ASSESSMENT:    1. Hypertensive heart and kidney disease without heart failure   2. Edema of both lower extremities   3. RBBB    PLAN:    In order of problems listed above:  Is relatively low from a safety perspective I will have her stop her ACE inhibitor. Stable continue her diuretic influenced by the fact that she takes gabapentin 3 neurology the papers marked edema Stable EKG pattern   Next appointment: 1 year   Medication Adjustments/Labs and Tests Ordered: Current medicines are reviewed at length with the patient today.  Concerns regarding medicines are outlined above.  No orders of the defined types were placed in this encounter.  No orders of the defined types were placed in this encounter.   Chief Complaint  Patient presents with   Follow-up   Leg Swelling   Hypertension    History of Present Illness:    Chloe [name] is a 76 y.o. female with a hx of chronic lower extremity edema without heart failure related to gabapentin hypertensive heart disease and stage III CKD hyperlipidemia type 2 diabetes and right bundle branch block last seen 03/07/2021.  She was recently seen in [facility] ED 01/07/2022 after a fall found on the floor and a temperature initially of 103.  Compliance with diet, lifestyle and medications: Yes  Overall doing well follows with nephrology for diabetic CKD Home blood pressure runs in the range of 100/70 I think she should stop her ACE inhibitor. Remains on a loop diuretic which is helped with her chronic edema No shortness of breath chest pain palpitation or syncope.  Echocardiogram 09/14/2018 shows normal left ventricular size systolic function and normal diastolic function.  PA systolic pressure was minimally elevated and  she had moderate tricuspid regurgitation. Echocardiogram 08/03/2019 showed normal ejection fraction 60 to 65% normal left ventricular filling pressure normal normal right ventricular size and function ascending aorta is mildly dilated 37 mm tricuspid regurgitation was felt to be trivial and her peak pulmonary artery systolic pressure was normal. 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) 09/08/2018   Myalgia and myositis 08/16/2013   Occipital headache    "qd" (01/10/2017)   Orthostatic hypotension 02/02/2015   Osteoarthritis 09/08/2018   Plantar fasciitis    Postablative hypothyroidism 01/21/2022  Seasonal asthma    Shingles    Stomach ulcer    "I take RX for it" (01/10/2017)   Stroke ([place]) 1997; 08/2016   mild right arm weakness   Syncope and collapse 08/16/2013   Syndesmotic disruption of ankle, right, sequela 03/13/2017   Thyrotoxicosis 01/21/2022   Type II diabetes mellitus ([place])    Vertebral artery dissection ([place])    right     Past Surgical History:   Procedure Laterality Date   ANAL FISSURE REPAIR     ANKLE ARTHROSCOPY Right 09/17/2018   Procedure: Right ankle arthroscopy with extensive debridement, removal of loose body;  Surgeon: Wylene Simmer, MD;  Location: [place];  Service: Orthopedics;  Laterality: Right;   ANKLE CLOSED REDUCTION Right 01/07/2017   Procedure: CLOSED REDUCTION ANKLE;  Surgeon: Rod Can, MD;  Location: [place];  Service: Orthopedics;  Laterality: Right;   APPENDECTOMY  1966   CATARACT EXTRACTION W/ INTRAOCULAR LENS  IMPLANT, BILATERAL  03/2014 - 04/2014   left - right    CHOLECYSTECTOMY  1994   CLOSED REDUCTION NASAL FRACTURE  1990"s   EXTERNAL FIXATION LEG Right 01/07/2017   Procedure: EXTERNAL FIXATION LEG;  Surgeon: Rod Can, MD;  Location: [place];  Service: Orthopedics;  Laterality: Right;   FRACTURE SURGERY     HAND SURGERY Bilateral    HARDWARE REMOVAL Right 09/17/2018   Procedure: Right ankle removal of deep implants tibia and fibula;  Surgeon: Wylene Simmer, MD;  Location: [place];  Service: Orthopedics;  Laterality: Right;   I & D EXTREMITY Right 10/22/2018   Procedure: Irrigation and debridement and closure of right ankle wound;  Surgeon: Wylene Simmer, MD;  Location: [place];  Service: Orthopedics;  Laterality: Right;   JOINT REPLACEMENT     LAPAROSCOPIC CHOLECYSTECTOMY     OPEN REDUCTION INTERNAL FIXATION (ORIF) TIBIA/FIBULA FRACTURE Right 01/20/2017   Procedure: OPEN REDUCTION INTERNAL FIXATION RIGHT ANKLE;  Surgeon: Rod Can, MD;  Location: [place];  Service: Orthopedics;  Laterality: Right;   radioactive iodine  05/2019   for hyperthyroidism   SALPINGOOPHORECTOMY  1978   SYNDESMOSIS REPAIR Right 03/13/2017   Procedure: HARDWARE REMOVAL OF RIGHT ABKLE, OPEN REDUCTION INTERNAL FIXATION OF SYNDESMOTIC INJURY;  Surgeon: Rod Can, MD;  Location: [facility];  Service: Orthopedics;  Laterality: Right;  Popliteal block   thyroid ablation   05/2019   for overactive thyroid   TONSILLECTOMY     TOTAL KNEE ARTHROPLASTY Bilateral    TUBAL LIGATION  1978   VAGINAL HYSTERECTOMY  1979   "partial"    Current Medications: Current Meds  Medication Sig   dicyclomine (BENTYL) 20 MG tablet Take 20 mg by mouth 3 (three) times daily.    Dulaglutide (TRULICITY) A999333 0000000 SOPN Inject 0.75 mg into the skin once a week.   furosemide (LASIX) 40 MG tablet Take 80 mg by mouth daily. And 40 mg more if needed for swelling   gabapentin (NEURONTIN) 400 MG capsule Take 800 mg by mouth in the morning. 3 at noon, 2 at supper and 2 at bedtime   levothyroxine (SYNTHROID) 88 MCG tablet Take 88 mcg by mouth every morning.   metoprolol tartrate (LOPRESSOR) 25 MG tablet Take 25 mg by mouth 2 (two) times daily.   omeprazole (PRILOSEC) 20 MG capsule Take 20 mg by mouth at bedtime.   rosuvastatin (CRESTOR) 5 MG tablet Take 5 mg by mouth 2 (two) times a week.   tiZANidine (ZANAFLEX) 2 MG tablet  Take 2 mg by mouth 3 (three) times daily.     Allergies:   Adhesive [tape], Codeine, Fenofibrate, and Simvastatin   Social History   Socioeconomic History   Marital status: Widowed    Spouse name: Merry Proud   Number of children: 4   Years of education: 13+   Highest education level: Not on file  Occupational History   Occupation: Retired   Tobacco Use   Smoking status: Former    Packs/day: 0.50    Years: 11.00    Pack years: 5.50    Types: Cigarettes    Quit date: 1985    Years since quitting: 38.2   Smokeless tobacco: Never  Vaping Use   Vaping Use: Never used  Substance and Sexual Activity   Alcohol use: No   Drug use: No   Sexual activity: Not Currently  Other Topics Concern   Not on file  Social History Narrative   Patient lives at home with her husband.    Patient is a retired Optometrist.    Patient has a high school education and some college.    Patient has four children.   Patient is right handed.   Patient drinks about 2 cups  caffeine daily.   Social Determinants of Health   Financial Resource Strain: Not on file  Food Insecurity: Not on file  Transportation Needs: Not on file  Physical Activity: Not on file  Stress: Not on file  Social Connections: Not on file     Family History: The patient's family history includes Alzheimer's disease in her mother; CAD in her mother; Congestive Heart Failure in her father; Diabetes in her brother, brother, father, mother, and sister; Heart attack in her mother; Hypertension in her brother. ROS:   Please see the history of present illness.    All other systems reviewed and are negative.  EKGs/Labs/Other Studies Reviewed:    The following studies were reviewed today:  EKG:  EKG ordered today and personally reviewed.  The ekg ordered today demonstrates sinus bradycardia 54 bpm right bundle branch block  Recent Labs: 11/08/2021 cholesterol 237 HDL 43 triglycerides 283 A1c 5.3% hemoglobin 12.3 creatinine 1.3 potassium 3.8  Physical Exam:    VS:  BP 98/70 (BP Location: Right Arm)    Pulse (!) 54    Ht 5\' 4"  (1.626 m)    Wt 242 lb (109.8 kg)    SpO2 99%    BMI 41.54 kg/m     Wt Readings from Last 3 Encounters:  02/27/22 242 lb (109.8 kg)  01/17/22 265 lb (120.2 kg)  05/14/21 260 lb (117.9 kg)     GEN:  Well nourished, well developed in no acute distress HEENT: Normal NECK: No JVD; No carotid bruits LYMPHATICS: No lymphadenopathy CARDIAC: RRR, no murmurs, rubs, gallops RESPIRATORY:  Clear to auscultation without rales, wheezing or rhonchi  ABDOMEN: Soft, non-tender, non-distended MUSCULOSKELETAL: Nonpitting lower extremity edema edema; No deformity  SKIN: Warm and dry NEUROLOGIC:  Alert and oriented x 3 PSYCHIATRIC:  Normal affect    Signed, Shirlee More, MD  02/27/2022 3:15 PM    [facility]

## 2022-02-27 ENCOUNTER — Ambulatory Visit (INDEPENDENT_AMBULATORY_CARE_PROVIDER_SITE_OTHER): Payer: Medicare PPO | Admitting: Cardiology

## 2022-02-27 ENCOUNTER — Other Ambulatory Visit: Payer: Self-pay

## 2022-02-27 ENCOUNTER — Encounter: Payer: Self-pay | Admitting: Cardiology

## 2022-02-27 VITALS — BP 98/70 | HR 54 | Ht 64.0 in | Wt 242.0 lb

## 2022-02-27 DIAGNOSIS — I4510 Unspecified right bundle-branch block: Secondary | ICD-10-CM

## 2022-02-27 DIAGNOSIS — I1310 Hypertensive heart and chronic kidney disease without heart failure, with stage 1 through stage 4 chronic kidney disease, or unspecified chronic kidney disease: Secondary | ICD-10-CM | POA: Diagnosis not present

## 2022-02-27 DIAGNOSIS — R600 Localized edema: Secondary | ICD-10-CM

## 2022-02-27 NOTE — Patient Instructions (Signed)
Medication Instructions:  ?Your physician has recommended you make the following change in your medication: Discontinue Altace ?Check BP at Home and record results and report back ? ?*If you need a refill on your cardiac medications before your next appointment, please call your pharmacy* ? ? ?Lab Work: ?NONE ?If you have labs (blood work) drawn today and your tests are completely normal, you will receive your results only by: ?MyChart Message (if you have MyChart) OR ?A paper copy in the mail ?If you have any lab test that is abnormal or we need to change your treatment, we will call you to review the results. ? ? ?Testing/Procedures: ?NONE ? ? ?Follow-Up: ?At [facility], you and your health needs are our priority.  As part of our continuing mission to provide you with exceptional heart care, we have created designated Provider Care Teams.  These Care Teams include your primary Cardiologist (physician) and Advanced Practice Providers (APPs -  Physician Assistants and Nurse Practitioners) who all work together to provide you with the care you need, when you need it. ? ?We recommend signing up for the patient portal called "MyChart".  Sign up information is provided on this After Visit Summary.  MyChart is used to connect with patients for Virtual Visits (Telemedicine).  Patients are able to view lab/test results, encounter notes, upcoming appointments, etc.  Non-urgent messages can be sent to your provider as well.   ?To learn more about what you can do with MyChart, go to [email or web address].   ? ?Your next appointment:   ?1 year(s) ? ?The format for your next appointment:   ?In Person ? ?Provider:   ?Norman Herrlich, MD  ? ? ?Other Instructions ?  ?

## 2022-03-05 ENCOUNTER — Telehealth: Payer: Self-pay | Admitting: Cardiology

## 2022-03-05 NOTE — Telephone Encounter (Signed)
Called patient and informed her of Dr. Joya Gaskins recommendation to wear a heart monitor for one week. Patient was agreeable to this plan. Monitor appointment was scheduled for 3/15 at 2:00 pm. Patient had no further questions at this time. ?

## 2022-03-05 NOTE — Telephone Encounter (Signed)
Pt c/o medication issue: ? ?1. Name of Medication: Ramtiril ? ?2. How are you currently taking this medication (dosage and times per day)? Taking 2 tablets a day  ? ?3. Are you having a reaction (difficulty breathing--STAT)? no ? ?4. What is your medication issue?  ?Patient calling to let Dr. Dulce Sellar that she took herself off on the Ramtiril per request. But she states that she going to go back to taking it because it made her bp high. Please advise ? ?

## 2022-03-06 ENCOUNTER — Other Ambulatory Visit (INDEPENDENT_AMBULATORY_CARE_PROVIDER_SITE_OTHER): Payer: Medicare PPO

## 2022-03-06 ENCOUNTER — Other Ambulatory Visit: Payer: Self-pay

## 2022-03-06 DIAGNOSIS — R55 Syncope and collapse: Secondary | ICD-10-CM

## 2022-03-12 DIAGNOSIS — N189 Chronic kidney disease, unspecified: Secondary | ICD-10-CM | POA: Diagnosis not present

## 2022-03-12 DIAGNOSIS — I129 Hypertensive chronic kidney disease with stage 1 through stage 4 chronic kidney disease, or unspecified chronic kidney disease: Secondary | ICD-10-CM | POA: Diagnosis not present

## 2022-03-12 DIAGNOSIS — D631 Anemia in chronic kidney disease: Secondary | ICD-10-CM | POA: Diagnosis not present

## 2022-03-12 DIAGNOSIS — N2581 Secondary hyperparathyroidism of renal origin: Secondary | ICD-10-CM | POA: Diagnosis not present

## 2022-03-12 DIAGNOSIS — N1832 Chronic kidney disease, stage 3b: Secondary | ICD-10-CM | POA: Diagnosis not present

## 2022-03-18 DIAGNOSIS — R55 Syncope and collapse: Secondary | ICD-10-CM | POA: Diagnosis not present

## 2022-03-22 ENCOUNTER — Other Ambulatory Visit: Payer: Self-pay | Admitting: Neurology

## 2022-03-26 ENCOUNTER — Telehealth: Payer: Self-pay | Admitting: Cardiology

## 2022-03-26 NOTE — Telephone Encounter (Signed)
Full VM 

## 2022-03-26 NOTE — Telephone Encounter (Signed)
Results reviewed with pt as per Dr. Munley's note.  Pt verbalized understanding and had no additional questions. Routed to PCP  

## 2022-03-26 NOTE — Telephone Encounter (Signed)
? ?  Pt is calling to get heart monitor result. She said to call her on this phone# [phone] ?

## 2022-04-17 DIAGNOSIS — I10 Essential (primary) hypertension: Secondary | ICD-10-CM | POA: Diagnosis not present

## 2022-04-17 DIAGNOSIS — N189 Chronic kidney disease, unspecified: Secondary | ICD-10-CM | POA: Diagnosis not present

## 2022-04-17 DIAGNOSIS — E7800 Pure hypercholesterolemia, unspecified: Secondary | ICD-10-CM | POA: Diagnosis not present

## 2022-04-17 DIAGNOSIS — E890 Postprocedural hypothyroidism: Secondary | ICD-10-CM | POA: Diagnosis not present

## 2022-04-17 DIAGNOSIS — G609 Hereditary and idiopathic neuropathy, unspecified: Secondary | ICD-10-CM | POA: Diagnosis not present

## 2022-04-17 DIAGNOSIS — E1165 Type 2 diabetes mellitus with hyperglycemia: Secondary | ICD-10-CM | POA: Diagnosis not present

## 2022-04-21 ENCOUNTER — Other Ambulatory Visit: Payer: Self-pay | Admitting: Neurology

## 2022-04-22 NOTE — Telephone Encounter (Signed)
Got a refill request for gabapentin and tizanidine, I denied this until we have more info. Last seen in May 2022, had appointment scheduled with me in Jan and Dr. Marjory Lies in January that were cancelled. There is phone note from 11/30 reporting increased creatinine needing to adjust her gabapentin. Max daily dose recommended was 1400 mg. Not clear she needs to follow at our office on routine basis, was being seen for fibromyalgia, chronic pain, neck pain/cervicogenic headache. PCP could manage or she could consider pain management.  ?

## 2022-04-22 NOTE — Telephone Encounter (Signed)
Last office visit note mentions gradual reduction of gabapentin. Please advise. ?

## 2022-05-14 DIAGNOSIS — E1122 Type 2 diabetes mellitus with diabetic chronic kidney disease: Secondary | ICD-10-CM | POA: Diagnosis not present

## 2022-05-14 DIAGNOSIS — I119 Hypertensive heart disease without heart failure: Secondary | ICD-10-CM | POA: Diagnosis not present

## 2022-05-14 DIAGNOSIS — E1142 Type 2 diabetes mellitus with diabetic polyneuropathy: Secondary | ICD-10-CM | POA: Diagnosis not present

## 2022-05-14 DIAGNOSIS — N1832 Chronic kidney disease, stage 3b: Secondary | ICD-10-CM | POA: Diagnosis not present

## 2022-05-14 DIAGNOSIS — N183 Chronic kidney disease, stage 3 unspecified: Secondary | ICD-10-CM | POA: Diagnosis not present

## 2022-05-14 DIAGNOSIS — Z6841 Body Mass Index (BMI) 40.0 and over, adult: Secondary | ICD-10-CM | POA: Diagnosis not present

## 2022-05-14 DIAGNOSIS — J302 Other seasonal allergic rhinitis: Secondary | ICD-10-CM | POA: Diagnosis not present

## 2022-05-14 DIAGNOSIS — Z794 Long term (current) use of insulin: Secondary | ICD-10-CM | POA: Diagnosis not present

## 2022-05-21 ENCOUNTER — Other Ambulatory Visit: Payer: Self-pay | Admitting: Neurology

## 2022-05-29 ENCOUNTER — Other Ambulatory Visit: Payer: Self-pay | Admitting: Neurology

## 2022-06-18 DIAGNOSIS — E890 Postprocedural hypothyroidism: Secondary | ICD-10-CM | POA: Diagnosis not present

## 2022-07-19 DIAGNOSIS — N183 Chronic kidney disease, stage 3 unspecified: Secondary | ICD-10-CM | POA: Diagnosis not present

## 2022-07-19 DIAGNOSIS — G4700 Insomnia, unspecified: Secondary | ICD-10-CM | POA: Diagnosis not present

## 2022-07-19 DIAGNOSIS — Z794 Long term (current) use of insulin: Secondary | ICD-10-CM | POA: Diagnosis not present

## 2022-07-19 DIAGNOSIS — Z6841 Body Mass Index (BMI) 40.0 and over, adult: Secondary | ICD-10-CM | POA: Diagnosis not present

## 2022-07-19 DIAGNOSIS — N1832 Chronic kidney disease, stage 3b: Secondary | ICD-10-CM | POA: Diagnosis not present

## 2022-07-19 DIAGNOSIS — K219 Gastro-esophageal reflux disease without esophagitis: Secondary | ICD-10-CM | POA: Diagnosis not present

## 2022-07-19 DIAGNOSIS — E1122 Type 2 diabetes mellitus with diabetic chronic kidney disease: Secondary | ICD-10-CM | POA: Diagnosis not present

## 2022-07-19 DIAGNOSIS — E1142 Type 2 diabetes mellitus with diabetic polyneuropathy: Secondary | ICD-10-CM | POA: Diagnosis not present

## 2022-07-22 ENCOUNTER — Encounter: Payer: Self-pay | Admitting: Podiatry

## 2022-07-22 ENCOUNTER — Ambulatory Visit: Payer: Medicare PPO | Admitting: Podiatry

## 2022-07-22 DIAGNOSIS — E1142 Type 2 diabetes mellitus with diabetic polyneuropathy: Secondary | ICD-10-CM | POA: Diagnosis not present

## 2022-07-22 DIAGNOSIS — M2042 Other hammer toe(s) (acquired), left foot: Secondary | ICD-10-CM | POA: Diagnosis not present

## 2022-07-22 DIAGNOSIS — M2041 Other hammer toe(s) (acquired), right foot: Secondary | ICD-10-CM | POA: Diagnosis not present

## 2022-07-22 NOTE — Progress Notes (Signed)
  Subjective:  Patient ID: Chloe [name], female    DOB: May 06, 1946,   MRN: [id number]  No chief complaint on file.   76 y.o. female presents for diabetic foot check and some concern for left foot second hammertoe. Denies any pain. Relates some pulling feelings that mostly occur at night. Currently on gabapentin She is diabetic and last A1c is unknown.  Denies any other pedal complaints. Denies n/v/f/c.   Past Medical History:  Diagnosis Date   Ankle syndesmosis disruption, right, sequela 03/13/2017   Cervical spondylosis without myelopathy 08/10/2015   Chronic kidney disease (CKD) 09/08/2018   Chronic kidney disease (CKD), stage III (moderate) (HCC)    Chronic low back pain 02/19/2017   Closed fracture dislocation of right ankle 01/07/2017   Diabetic peripheral neuropathy associated with type 2 diabetes mellitus (HCC) 09/08/2018   Dissection of cerebral artery (HCC) 1997   "no OR"   Endometriosis    Family history of adverse reaction to anesthesia    "daughter has PONV"   Fibromyalgia    GERD without esophagitis 09/08/2018   Headache 08/16/2013   Hereditary and idiopathic neuropathy, unspecified 01/21/2022   High cholesterol    hx   History of hiatal hernia    Hyperglycemia due to type 2 diabetes mellitus (HCC) 01/21/2022   Hypertension    Hypertensive heart disease without heart failure 09/08/2018   Mixed hyperlipidemia 09/08/2018   Morbid obesity (HCC) 09/08/2018   Myalgia and myositis 08/16/2013   Occipital headache    "qd" (01/10/2017)   Orthostatic hypotension 02/02/2015   Osteoarthritis 09/08/2018   Plantar fasciitis    Postablative hypothyroidism 01/21/2022   Seasonal asthma    Shingles    Stomach ulcer    "I take RX for it" (01/10/2017)   Stroke (HCC) 1997; 08/2016   mild right arm weakness   Syncope and collapse 08/16/2013   Syndesmotic disruption of ankle, right, sequela 03/13/2017   Thyrotoxicosis 01/21/2022   Type II diabetes mellitus (HCC)    Vertebral  artery dissection (HCC)    right     Objective:  Physical Exam: Vascular: DP/PT pulses 2/4 bilateral. CFT <3 seconds. Normal hair growth on digits. No edema.  Skin. No lacerations or abrasions bilateral feet.  Musculoskeletal: MMT 5/5 bilateral lower extremities in DF, PF, Inversion and Eversion. Deceased ROM in DF of ankle joint. Mild hammered digits 2-5 bilateral.  Neurological: Sensation intact to light touch. Protective sensation intact.   Assessment:   1. Type 2 diabetes mellitus with diabetic polyneuropathy, unspecified whether long term insulin use (HCC)      Plan:  Patient was evaluated and treated and all questions answered. -Discussed and educated patient on diabetic foot care, especially with  regards to the vascular, neurological and musculoskeletal systems.  -Stressed the importance of good glycemic control and the detriment of not  controlling glucose levels in relation to the foot. -Discussed supportive shoes at all times and checking feet regularly.  -Mechanically debrided all nails 1-5 bilateral using sterile nail nipper and filed with dremel without incident  -Answered all patient questions -Educated on hammertoes and treatment options  -Discussed padding including toe caps and crest pads.  Patient to follow-up in one year for diabetic foot check.     Louann Sjogren, DPM

## 2022-07-23 DIAGNOSIS — Z1231 Encounter for screening mammogram for malignant neoplasm of breast: Secondary | ICD-10-CM | POA: Diagnosis not present

## 2022-08-14 DIAGNOSIS — R928 Other abnormal and inconclusive findings on diagnostic imaging of breast: Secondary | ICD-10-CM | POA: Diagnosis not present

## 2022-08-14 DIAGNOSIS — N6489 Other specified disorders of breast: Secondary | ICD-10-CM | POA: Diagnosis not present

## 2022-09-11 DIAGNOSIS — D631 Anemia in chronic kidney disease: Secondary | ICD-10-CM | POA: Diagnosis not present

## 2022-09-11 DIAGNOSIS — N1832 Chronic kidney disease, stage 3b: Secondary | ICD-10-CM | POA: Diagnosis not present

## 2022-09-11 DIAGNOSIS — I129 Hypertensive chronic kidney disease with stage 1 through stage 4 chronic kidney disease, or unspecified chronic kidney disease: Secondary | ICD-10-CM | POA: Diagnosis not present

## 2022-09-11 DIAGNOSIS — N2581 Secondary hyperparathyroidism of renal origin: Secondary | ICD-10-CM | POA: Diagnosis not present

## 2022-09-11 DIAGNOSIS — N189 Chronic kidney disease, unspecified: Secondary | ICD-10-CM | POA: Diagnosis not present

## 2022-10-09 DIAGNOSIS — E1165 Type 2 diabetes mellitus with hyperglycemia: Secondary | ICD-10-CM | POA: Diagnosis not present

## 2022-10-09 DIAGNOSIS — E890 Postprocedural hypothyroidism: Secondary | ICD-10-CM | POA: Diagnosis not present

## 2022-10-16 DIAGNOSIS — E1165 Type 2 diabetes mellitus with hyperglycemia: Secondary | ICD-10-CM | POA: Diagnosis not present

## 2022-10-16 DIAGNOSIS — G609 Hereditary and idiopathic neuropathy, unspecified: Secondary | ICD-10-CM | POA: Diagnosis not present

## 2022-10-16 DIAGNOSIS — I10 Essential (primary) hypertension: Secondary | ICD-10-CM | POA: Diagnosis not present

## 2022-10-16 DIAGNOSIS — E890 Postprocedural hypothyroidism: Secondary | ICD-10-CM | POA: Diagnosis not present

## 2022-10-16 DIAGNOSIS — E7800 Pure hypercholesterolemia, unspecified: Secondary | ICD-10-CM | POA: Diagnosis not present

## 2022-10-16 DIAGNOSIS — N189 Chronic kidney disease, unspecified: Secondary | ICD-10-CM | POA: Diagnosis not present

## 2022-10-22 DIAGNOSIS — H26493 Other secondary cataract, bilateral: Secondary | ICD-10-CM | POA: Diagnosis not present

## 2022-10-22 DIAGNOSIS — E119 Type 2 diabetes mellitus without complications: Secondary | ICD-10-CM | POA: Diagnosis not present

## 2022-10-22 DIAGNOSIS — Z794 Long term (current) use of insulin: Secondary | ICD-10-CM | POA: Diagnosis not present

## 2022-10-22 DIAGNOSIS — H02839 Dermatochalasis of unspecified eye, unspecified eyelid: Secondary | ICD-10-CM | POA: Diagnosis not present

## 2022-10-22 DIAGNOSIS — H524 Presbyopia: Secondary | ICD-10-CM | POA: Diagnosis not present

## 2022-10-22 DIAGNOSIS — Z961 Presence of intraocular lens: Secondary | ICD-10-CM | POA: Diagnosis not present

## 2022-10-24 DIAGNOSIS — L578 Other skin changes due to chronic exposure to nonionizing radiation: Secondary | ICD-10-CM | POA: Diagnosis not present

## 2022-10-24 DIAGNOSIS — D485 Neoplasm of uncertain behavior of skin: Secondary | ICD-10-CM | POA: Diagnosis not present

## 2022-10-24 DIAGNOSIS — L820 Inflamed seborrheic keratosis: Secondary | ICD-10-CM | POA: Diagnosis not present

## 2022-10-24 DIAGNOSIS — L814 Other melanin hyperpigmentation: Secondary | ICD-10-CM | POA: Diagnosis not present

## 2022-10-24 DIAGNOSIS — D225 Melanocytic nevi of trunk: Secondary | ICD-10-CM | POA: Diagnosis not present

## 2022-11-11 DIAGNOSIS — Z794 Long term (current) use of insulin: Secondary | ICD-10-CM | POA: Diagnosis not present

## 2022-11-11 DIAGNOSIS — E1122 Type 2 diabetes mellitus with diabetic chronic kidney disease: Secondary | ICD-10-CM | POA: Diagnosis not present

## 2022-11-11 DIAGNOSIS — Z6839 Body mass index (BMI) 39.0-39.9, adult: Secondary | ICD-10-CM | POA: Diagnosis not present

## 2022-11-11 DIAGNOSIS — Z1331 Encounter for screening for depression: Secondary | ICD-10-CM | POA: Diagnosis not present

## 2022-11-11 DIAGNOSIS — N183 Chronic kidney disease, stage 3 unspecified: Secondary | ICD-10-CM | POA: Diagnosis not present

## 2022-11-11 DIAGNOSIS — N1832 Chronic kidney disease, stage 3b: Secondary | ICD-10-CM | POA: Diagnosis not present

## 2022-11-11 DIAGNOSIS — Z0000 Encounter for general adult medical examination without abnormal findings: Secondary | ICD-10-CM | POA: Diagnosis not present

## 2022-11-11 DIAGNOSIS — E782 Mixed hyperlipidemia: Secondary | ICD-10-CM | POA: Diagnosis not present

## 2023-03-07 DIAGNOSIS — I129 Hypertensive chronic kidney disease with stage 1 through stage 4 chronic kidney disease, or unspecified chronic kidney disease: Secondary | ICD-10-CM | POA: Diagnosis not present

## 2023-03-07 DIAGNOSIS — N189 Chronic kidney disease, unspecified: Secondary | ICD-10-CM | POA: Diagnosis not present

## 2023-03-07 DIAGNOSIS — N2581 Secondary hyperparathyroidism of renal origin: Secondary | ICD-10-CM | POA: Diagnosis not present

## 2023-03-07 DIAGNOSIS — N1832 Chronic kidney disease, stage 3b: Secondary | ICD-10-CM | POA: Diagnosis not present

## 2023-03-07 DIAGNOSIS — D631 Anemia in chronic kidney disease: Secondary | ICD-10-CM | POA: Diagnosis not present

## 2023-03-19 ENCOUNTER — Telehealth: Payer: Self-pay

## 2023-03-19 NOTE — Telephone Encounter (Signed)
Office notes for 03/07/23 received for our records from [employer]. Records to Dr Bettina Gavia to sign

## 2023-04-16 DIAGNOSIS — E7800 Pure hypercholesterolemia, unspecified: Secondary | ICD-10-CM | POA: Diagnosis not present

## 2023-04-16 DIAGNOSIS — E1165 Type 2 diabetes mellitus with hyperglycemia: Secondary | ICD-10-CM | POA: Diagnosis not present

## 2023-04-16 DIAGNOSIS — E890 Postprocedural hypothyroidism: Secondary | ICD-10-CM | POA: Diagnosis not present

## 2023-04-17 DIAGNOSIS — H18831 Recurrent erosion of cornea, right eye: Secondary | ICD-10-CM | POA: Diagnosis not present

## 2023-04-17 DIAGNOSIS — Z794 Long term (current) use of insulin: Secondary | ICD-10-CM | POA: Diagnosis not present

## 2023-04-17 DIAGNOSIS — H26493 Other secondary cataract, bilateral: Secondary | ICD-10-CM | POA: Diagnosis not present

## 2023-04-17 DIAGNOSIS — H02839 Dermatochalasis of unspecified eye, unspecified eyelid: Secondary | ICD-10-CM | POA: Diagnosis not present

## 2023-04-17 DIAGNOSIS — Z961 Presence of intraocular lens: Secondary | ICD-10-CM | POA: Diagnosis not present

## 2023-04-17 DIAGNOSIS — E119 Type 2 diabetes mellitus without complications: Secondary | ICD-10-CM | POA: Diagnosis not present

## 2023-04-23 DIAGNOSIS — E1165 Type 2 diabetes mellitus with hyperglycemia: Secondary | ICD-10-CM | POA: Diagnosis not present

## 2023-04-23 DIAGNOSIS — E890 Postprocedural hypothyroidism: Secondary | ICD-10-CM | POA: Diagnosis not present

## 2023-04-23 DIAGNOSIS — I10 Essential (primary) hypertension: Secondary | ICD-10-CM | POA: Diagnosis not present

## 2023-04-23 DIAGNOSIS — E7800 Pure hypercholesterolemia, unspecified: Secondary | ICD-10-CM | POA: Diagnosis not present

## 2023-04-23 DIAGNOSIS — N189 Chronic kidney disease, unspecified: Secondary | ICD-10-CM | POA: Diagnosis not present

## 2023-04-23 DIAGNOSIS — G609 Hereditary and idiopathic neuropathy, unspecified: Secondary | ICD-10-CM | POA: Diagnosis not present

## 2023-05-13 DIAGNOSIS — H18831 Recurrent erosion of cornea, right eye: Secondary | ICD-10-CM | POA: Diagnosis not present

## 2023-05-13 DIAGNOSIS — H469 Unspecified optic neuritis: Secondary | ICD-10-CM | POA: Diagnosis not present

## 2023-05-13 DIAGNOSIS — H26493 Other secondary cataract, bilateral: Secondary | ICD-10-CM | POA: Diagnosis not present

## 2023-05-13 DIAGNOSIS — Z961 Presence of intraocular lens: Secondary | ICD-10-CM | POA: Diagnosis not present

## 2023-05-13 DIAGNOSIS — H02839 Dermatochalasis of unspecified eye, unspecified eyelid: Secondary | ICD-10-CM | POA: Diagnosis not present

## 2023-05-21 DIAGNOSIS — E876 Hypokalemia: Secondary | ICD-10-CM | POA: Diagnosis not present

## 2023-05-21 DIAGNOSIS — Z6836 Body mass index (BMI) 36.0-36.9, adult: Secondary | ICD-10-CM | POA: Diagnosis not present

## 2023-05-21 DIAGNOSIS — E1122 Type 2 diabetes mellitus with diabetic chronic kidney disease: Secondary | ICD-10-CM | POA: Diagnosis not present

## 2023-05-21 DIAGNOSIS — Z1331 Encounter for screening for depression: Secondary | ICD-10-CM | POA: Diagnosis not present

## 2023-05-21 DIAGNOSIS — N1832 Chronic kidney disease, stage 3b: Secondary | ICD-10-CM | POA: Diagnosis not present

## 2023-05-21 DIAGNOSIS — E1142 Type 2 diabetes mellitus with diabetic polyneuropathy: Secondary | ICD-10-CM | POA: Diagnosis not present

## 2023-05-27 DIAGNOSIS — E1165 Type 2 diabetes mellitus with hyperglycemia: Secondary | ICD-10-CM | POA: Diagnosis not present

## 2023-05-27 DIAGNOSIS — Q079 Congenital malformation of nervous system, unspecified: Secondary | ICD-10-CM | POA: Diagnosis not present

## 2023-05-27 DIAGNOSIS — H5711 Ocular pain, right eye: Secondary | ICD-10-CM | POA: Diagnosis not present

## 2023-05-27 DIAGNOSIS — H43811 Vitreous degeneration, right eye: Secondary | ICD-10-CM | POA: Diagnosis not present

## 2023-07-15 ENCOUNTER — Other Ambulatory Visit: Payer: Self-pay

## 2023-07-17 NOTE — Progress Notes (Signed)
Cardiology Office Note:    Date:  07/18/2023   ID:  Chloe [name], DOB October 20, 1946, MRN [id number]  PCP:  Buckner Malta, MD  Cardiologist:  Norman Herrlich, MD    Referring MD: Buckner Malta, MD    ASSESSMENT:    1. Hypertensive heart and kidney disease without heart failure   2. RBBB   3. Edema of both lower extremities   4. Mixed hyperlipidemia   5. Costochondral chest pain    PLAN:    In order of problems listed above:  Stable blood pressure well-controlled no orthostatic hypotension continue her loop diuretic which helps with the edema from gabapentin and beta-blocker Stable EKG pattern no ischemic changes Continue her statin Nonanginal symptoms show use Tylenol as needed I do not think we need to do a cardiac CTA at this time   Next appointment: 1 year   Medication Adjustments/Labs and Tests Ordered: Current medicines are reviewed at length with the patient today.  Concerns regarding medicines are outlined above.  Orders Placed This Encounter  Procedures   EKG 12-Lead   No orders of the defined types were placed in this encounter.    History of Present Illness:    Chloe [name] is a 77 y.o. female with a hx of lower extremity edema without heart failure related to gabapentin hypertensive heart disease with stage III CKD right bundle branch block hyperlipidemia type 2 diabetes last seen 02/27/2022.  Echocardiogram 09/14/2018 shows normal left ventricular size systolic function and normal diastolic function.  PA systolic pressure was minimally elevated and she had moderate tricuspid regurgitation. Echocardiogram 08/03/2019 showed normal ejection fraction 60 to 65% normal left ventricular filling pressure normal normal right ventricular size and function ascending aorta is mildly dilated 37 mm tricuspid regurgitation was felt to be trivial and her peak pulmonary artery systolic pressure was normal.  She was seen by [facility] March 2024 with  stage III CKD there is a notation that her ACE inhibitor was discontinued because of symptomatic orthostatic hypotension following a hospitalization in January with diverticulitis.  Her kidney disease was stable her creatinine at that time 1.56 with GFR 35 cc hemoglobin 13.9   Compliance with diet, lifestyle and medications: Yes  She continues to check gabapentin still has edema although is controlled with the diuretic She has had intermittent chest discomfort nonanginal in nature point localized left costochondral junction reproducible on physical exam last 4 days unrelieved with rest. Symptoms are not severe or frequent Is not having shortness of breath orthopnea palpitation or syncope Past Medical History:  Diagnosis Date   Ankle syndesmosis disruption, right, sequela 03/13/2017   Cervical spondylosis without myelopathy 08/10/2015   Chronic kidney disease (CKD) 09/08/2018   Chronic kidney disease (CKD), stage III (moderate) (HCC)    Chronic low back pain 02/19/2017   Chronic lower back pain    Closed fracture dislocation of right ankle 01/07/2017   Closed fracture of fourth metatarsal bone 06/05/2020   Diabetic peripheral neuropathy associated with type 2 diabetes mellitus (HCC) 09/08/2018   Dissection of cerebral artery (HCC) 1997   "no OR"   Endometriosis    Family history of adverse reaction to anesthesia    "daughter has PONV"   Fibromyalgia    GERD (gastroesophageal reflux disease)    GERD without esophagitis 09/08/2018   Headache 08/16/2013   Hereditary and idiopathic neuropathy, unspecified 01/21/2022   High cholesterol    hx   History of hiatal hernia    Hypercholesterolemia 11/21/2021  Hyperglycemia due to type 2 diabetes mellitus (HCC) 01/21/2022   Hypertension    Hypertensive heart disease without heart failure 09/08/2018   Mallet fracture, closed 10/27/2020   Mixed hyperlipidemia 09/08/2018   Morbid obesity (HCC) 09/08/2018   Myalgia and myositis 08/16/2013    Obesity    Occipital headache    "qd" (01/10/2017)   Orthostatic hypotension 02/02/2015   Osteoarthritis 09/08/2018   Osteoarthritis of finger of right hand 10/27/2020   Plantar fasciitis    Postablative hypothyroidism 01/21/2022   Preoperative cardiovascular examination 09/11/2018   Seasonal asthma    Shingles    Stomach ulcer    "I take RX for it" (01/10/2017)   Stroke (HCC) 1997; 08/2016   mild right arm weakness   Syncope and collapse 08/16/2013   Syndesmotic disruption of ankle, right, sequela 03/13/2017   Thyrotoxicosis 01/21/2022   Type II diabetes mellitus (HCC)    Vertebral artery dissection (HCC)    right     Current Medications: Current Meds  Medication Sig   dicyclomine (BENTYL) 20 MG tablet Take 20 mg by mouth 3 (three) times daily.    Dulaglutide (TRULICITY) 0.75 MG/0.5ML SOPN Inject 0.75 mg into the skin once a week.   furosemide (LASIX) 40 MG tablet Take 80 mg by mouth daily. And 40 mg more if needed for swelling   gabapentin (NEURONTIN) 400 MG capsule Take 800 mg by mouth in the morning.  2 at supper and 2 at bedtime   levothyroxine (SYNTHROID) 88 MCG tablet Take 88 mcg by mouth every morning.   metoprolol tartrate (LOPRESSOR) 25 MG tablet Take 25 mg by mouth 2 (two) times daily.   rosuvastatin (CRESTOR) 5 MG tablet Take 5 mg by mouth 2 (two) times a week.   tiZANidine (ZANAFLEX) 2 MG tablet Take 2 mg by mouth 3 (three) times daily.   zolpidem (AMBIEN) 5 MG tablet Take 5 mg by mouth at bedtime as needed for sleep.      EKGs/Labs/Other Studies Reviewed:    The following studies were reviewed today:  EKG Interpretation Date/Time:  Friday July 18 2023 11:26:38 EDT Ventricular Rate:  54 PR Interval:  166 QRS Duration:  158 QT Interval:  468 QTC Calculation: 443 R Axis:   -1  Text Interpretation: Sinus bradycardia Right bundle branch block Confirmed by Norman Herrlich ([id number]) on 07/18/2023 11:34:00 AM    Recent Labs: Recent labs 04/16/2023 cholesterol 200  LDL 111 A1c 5.5% hemoglobin 13.9 creatinine 1.41 magnesium 2.3  Physical Exam:    VS:  BP 108/68   Pulse (!) 54   Ht 5\' 3"  (1.6 m)   Wt 217 lb (98.4 kg)   SpO2 97%   BMI 38.44 kg/m     Wt Readings from Last 3 Encounters:  07/18/23 217 lb (98.4 kg)  02/27/22 242 lb (109.8 kg)  01/17/22 265 lb (120.2 kg)     GEN:  Well nourished, well developed in no acute distress HEENT: Normal NECK: No JVD; No carotid bruits LYMPHATICS: No lymphadenopathy CARDIAC: RRR, no murmurs, rubs, gallops RESPIRATORY:  Clear to auscultation without rales, wheezing or rhonchi  ABDOMEN: Soft, non-tender, non-distended MUSCULOSKELETAL:  No edema; No deformity  SKIN: Warm and dry NEUROLOGIC:  Alert and oriented x 3 PSYCHIATRIC:  Normal affect    Signed, Norman Herrlich, MD  07/18/2023 11:59 AM    [facility]

## 2023-07-18 ENCOUNTER — Encounter: Payer: Self-pay | Admitting: Cardiology

## 2023-07-18 ENCOUNTER — Ambulatory Visit: Payer: Medicare PPO | Admitting: Cardiology

## 2023-07-18 VITALS — BP 108/68 | HR 54 | Ht 63.0 in | Wt 217.0 lb

## 2023-07-18 DIAGNOSIS — R0789 Other chest pain: Secondary | ICD-10-CM

## 2023-07-18 DIAGNOSIS — R600 Localized edema: Secondary | ICD-10-CM

## 2023-07-18 DIAGNOSIS — E782 Mixed hyperlipidemia: Secondary | ICD-10-CM | POA: Diagnosis not present

## 2023-07-18 DIAGNOSIS — I1310 Hypertensive heart and chronic kidney disease without heart failure, with stage 1 through stage 4 chronic kidney disease, or unspecified chronic kidney disease: Secondary | ICD-10-CM | POA: Diagnosis not present

## 2023-07-18 DIAGNOSIS — I4510 Unspecified right bundle-branch block: Secondary | ICD-10-CM

## 2023-07-18 NOTE — Patient Instructions (Signed)

## 2023-07-23 DIAGNOSIS — Z7982 Long term (current) use of aspirin: Secondary | ICD-10-CM | POA: Diagnosis not present

## 2023-07-23 DIAGNOSIS — I6521 Occlusion and stenosis of right carotid artery: Secondary | ICD-10-CM | POA: Diagnosis not present

## 2023-07-23 DIAGNOSIS — S161XXA Strain of muscle, fascia and tendon at neck level, initial encounter: Secondary | ICD-10-CM | POA: Diagnosis not present

## 2023-07-23 DIAGNOSIS — M542 Cervicalgia: Secondary | ICD-10-CM | POA: Diagnosis not present

## 2023-07-23 DIAGNOSIS — T1490XA Injury, unspecified, initial encounter: Secondary | ICD-10-CM | POA: Diagnosis not present

## 2023-07-23 DIAGNOSIS — Z794 Long term (current) use of insulin: Secondary | ICD-10-CM | POA: Diagnosis not present

## 2023-07-23 DIAGNOSIS — Z79899 Other long term (current) drug therapy: Secondary | ICD-10-CM | POA: Diagnosis not present

## 2023-07-23 DIAGNOSIS — I700 Atherosclerosis of aorta: Secondary | ICD-10-CM | POA: Diagnosis not present

## 2023-08-01 DIAGNOSIS — N1832 Chronic kidney disease, stage 3b: Secondary | ICD-10-CM | POA: Diagnosis not present

## 2023-08-01 DIAGNOSIS — E785 Hyperlipidemia, unspecified: Secondary | ICD-10-CM | POA: Diagnosis not present

## 2023-08-01 DIAGNOSIS — S161XXD Strain of muscle, fascia and tendon at neck level, subsequent encounter: Secondary | ICD-10-CM | POA: Diagnosis not present

## 2023-08-01 DIAGNOSIS — E1142 Type 2 diabetes mellitus with diabetic polyneuropathy: Secondary | ICD-10-CM | POA: Diagnosis not present

## 2023-08-01 DIAGNOSIS — E1169 Type 2 diabetes mellitus with other specified complication: Secondary | ICD-10-CM | POA: Diagnosis not present

## 2023-08-01 DIAGNOSIS — E1122 Type 2 diabetes mellitus with diabetic chronic kidney disease: Secondary | ICD-10-CM | POA: Diagnosis not present

## 2023-08-01 DIAGNOSIS — Z6837 Body mass index (BMI) 37.0-37.9, adult: Secondary | ICD-10-CM | POA: Diagnosis not present

## 2023-08-01 DIAGNOSIS — N183 Chronic kidney disease, stage 3 unspecified: Secondary | ICD-10-CM | POA: Diagnosis not present

## 2023-08-01 DIAGNOSIS — Z794 Long term (current) use of insulin: Secondary | ICD-10-CM | POA: Diagnosis not present

## 2023-08-07 DIAGNOSIS — L720 Epidermal cyst: Secondary | ICD-10-CM | POA: Diagnosis not present

## 2023-08-07 DIAGNOSIS — L821 Other seborrheic keratosis: Secondary | ICD-10-CM | POA: Diagnosis not present

## 2023-08-07 DIAGNOSIS — D225 Melanocytic nevi of trunk: Secondary | ICD-10-CM | POA: Diagnosis not present

## 2023-08-28 DIAGNOSIS — Z961 Presence of intraocular lens: Secondary | ICD-10-CM | POA: Diagnosis not present

## 2023-08-28 DIAGNOSIS — H02839 Dermatochalasis of unspecified eye, unspecified eyelid: Secondary | ICD-10-CM | POA: Diagnosis not present

## 2023-08-28 DIAGNOSIS — H469 Unspecified optic neuritis: Secondary | ICD-10-CM | POA: Diagnosis not present

## 2023-08-28 DIAGNOSIS — H26493 Other secondary cataract, bilateral: Secondary | ICD-10-CM | POA: Diagnosis not present

## 2023-08-28 DIAGNOSIS — H18831 Recurrent erosion of cornea, right eye: Secondary | ICD-10-CM | POA: Diagnosis not present

## 2023-09-22 DIAGNOSIS — I129 Hypertensive chronic kidney disease with stage 1 through stage 4 chronic kidney disease, or unspecified chronic kidney disease: Secondary | ICD-10-CM | POA: Diagnosis not present

## 2023-09-22 DIAGNOSIS — N1832 Chronic kidney disease, stage 3b: Secondary | ICD-10-CM | POA: Diagnosis not present

## 2023-09-22 DIAGNOSIS — N2581 Secondary hyperparathyroidism of renal origin: Secondary | ICD-10-CM | POA: Diagnosis not present

## 2023-09-22 DIAGNOSIS — D631 Anemia in chronic kidney disease: Secondary | ICD-10-CM | POA: Diagnosis not present

## 2023-10-24 DIAGNOSIS — E1165 Type 2 diabetes mellitus with hyperglycemia: Secondary | ICD-10-CM | POA: Diagnosis not present

## 2023-10-24 DIAGNOSIS — E890 Postprocedural hypothyroidism: Secondary | ICD-10-CM | POA: Diagnosis not present

## 2023-10-24 DIAGNOSIS — E7800 Pure hypercholesterolemia, unspecified: Secondary | ICD-10-CM | POA: Diagnosis not present

## 2023-10-27 DIAGNOSIS — L814 Other melanin hyperpigmentation: Secondary | ICD-10-CM | POA: Diagnosis not present

## 2023-10-27 DIAGNOSIS — L821 Other seborrheic keratosis: Secondary | ICD-10-CM | POA: Diagnosis not present

## 2023-10-27 DIAGNOSIS — D225 Melanocytic nevi of trunk: Secondary | ICD-10-CM | POA: Diagnosis not present

## 2023-10-27 DIAGNOSIS — L301 Dyshidrosis [pompholyx]: Secondary | ICD-10-CM | POA: Diagnosis not present

## 2023-10-27 DIAGNOSIS — L299 Pruritus, unspecified: Secondary | ICD-10-CM | POA: Diagnosis not present

## 2023-10-31 DIAGNOSIS — E890 Postprocedural hypothyroidism: Secondary | ICD-10-CM | POA: Diagnosis not present

## 2023-10-31 DIAGNOSIS — I10 Essential (primary) hypertension: Secondary | ICD-10-CM | POA: Diagnosis not present

## 2023-10-31 DIAGNOSIS — E7800 Pure hypercholesterolemia, unspecified: Secondary | ICD-10-CM | POA: Diagnosis not present

## 2023-10-31 DIAGNOSIS — G609 Hereditary and idiopathic neuropathy, unspecified: Secondary | ICD-10-CM | POA: Diagnosis not present

## 2023-10-31 DIAGNOSIS — E1165 Type 2 diabetes mellitus with hyperglycemia: Secondary | ICD-10-CM | POA: Diagnosis not present

## 2023-10-31 DIAGNOSIS — N189 Chronic kidney disease, unspecified: Secondary | ICD-10-CM | POA: Diagnosis not present

## 2023-11-18 DIAGNOSIS — Z6836 Body mass index (BMI) 36.0-36.9, adult: Secondary | ICD-10-CM | POA: Diagnosis not present

## 2023-11-18 DIAGNOSIS — Z0000 Encounter for general adult medical examination without abnormal findings: Secondary | ICD-10-CM | POA: Diagnosis not present

## 2023-11-18 DIAGNOSIS — E1122 Type 2 diabetes mellitus with diabetic chronic kidney disease: Secondary | ICD-10-CM | POA: Diagnosis not present

## 2023-11-18 DIAGNOSIS — Z1339 Encounter for screening examination for other mental health and behavioral disorders: Secondary | ICD-10-CM | POA: Diagnosis not present

## 2023-11-18 DIAGNOSIS — E785 Hyperlipidemia, unspecified: Secondary | ICD-10-CM | POA: Diagnosis not present

## 2023-11-18 DIAGNOSIS — N1832 Chronic kidney disease, stage 3b: Secondary | ICD-10-CM | POA: Diagnosis not present

## 2023-11-18 DIAGNOSIS — Z1331 Encounter for screening for depression: Secondary | ICD-10-CM | POA: Diagnosis not present

## 2023-11-18 DIAGNOSIS — E1169 Type 2 diabetes mellitus with other specified complication: Secondary | ICD-10-CM | POA: Diagnosis not present

## 2023-12-31 DIAGNOSIS — E890 Postprocedural hypothyroidism: Secondary | ICD-10-CM | POA: Diagnosis not present

## 2024-01-19 ENCOUNTER — Telehealth: Payer: Self-pay | Admitting: Neurology

## 2024-01-19 NOTE — Telephone Encounter (Signed)
Pt requested an appointment with MD since now former pt of Dr Anne Hahn

## 2024-04-13 ENCOUNTER — Encounter: Payer: Self-pay | Admitting: Neurology

## 2024-04-13 ENCOUNTER — Ambulatory Visit: Payer: Medicare PPO | Admitting: Neurology

## 2024-04-13 VITALS — BP 106/68 | HR 78 | Ht 60.0 in | Wt 221.0 lb

## 2024-04-13 DIAGNOSIS — Z7985 Long-term (current) use of injectable non-insulin antidiabetic drugs: Secondary | ICD-10-CM | POA: Diagnosis not present

## 2024-04-13 DIAGNOSIS — G8929 Other chronic pain: Secondary | ICD-10-CM | POA: Diagnosis not present

## 2024-04-13 DIAGNOSIS — R519 Headache, unspecified: Secondary | ICD-10-CM

## 2024-04-13 DIAGNOSIS — E1142 Type 2 diabetes mellitus with diabetic polyneuropathy: Secondary | ICD-10-CM

## 2024-04-13 DIAGNOSIS — R269 Unspecified abnormalities of gait and mobility: Secondary | ICD-10-CM

## 2024-04-13 NOTE — Progress Notes (Signed)
 Chief Complaint  Patient presents with   Follow-up    Pt in 14, here alone  Former Dr Tilda Fogo pt. Pt is here for follow up on occipital headache.       ASSESSMENT AND PLAN  Chloe [name] is a 78 y.o. female   Diabetic peripheral neuropathy Restless leg syndrome Chronic headache Polypharmacy  Will continue refill of tizanidine , gabapentin , Ambien  by her primary care,  Emphasized the potential side effect with large dose polypharmacy treatment, offered option of physical therapy, adding on low-dose of nortriptyline, she declined,   Continue follow-up with primary care only return to clinic for new issues  DIAGNOSTIC DATA (LABS, IMAGING, TESTING) - I reviewed patient records, labs, notes, testing and imaging myself where available.   MEDICAL HISTORY:  Chloe [name], is a 78 year old female, follow-up for chronic headache, lower extremity paresthesia, seen in request by Dr. Reather Campbell, [facility]  History is obtained from the patient and review of electronic medical records. I personally reviewed pertinent available imaging films in PACS.   PMHx of  HTN HLD Hypothyrodism DM Right vertebral artery dissection in 1997, took coumadin for few months,   She was seen by our clinic by Dr. Tilda Fogo in several in the past, last visit was in May 2022  She had a history of right vertebral artery dissection in late 1990s, per patient at the same time she was getting shingle blister at the right cervical region, since then she had a chronic headache,  Over the years, she was treated with a high dose of gabapentin , tizanidine , also has difficulty sleeping, taking Ambien , diabetic peripheral neuropathy, bilateral lower extremity paresthesia  Tried Cymbalta  in the past, could not tolerated due to side effect  Since last visit in May 2022, she was getting prescription by her primary care physician, but complains of dosage adjustment, she is also seen by [facility] for her chronic kidney  disease, has unsteady gait, fell with self injury required rehabilitation placement in the past  She is not taking tizanidine  2 mg 3 times a day, Ambien  5 mg every night, gabapentin  400 mg 2 tablets 3 times a day, her symptoms are manageable, but remain bothersome, complains of difficulty sleeping at nighttime and urge to move her legs, unsteady gait, headache at the right occipital region, spreading forward, taking Tylenol  as needed for that  I have offered repeat laboratory evaluation to rule out ferritin deficiency, for complaints of restless leg syndrome, difficulty sleeping at nighttime, she declined,  I also offered physical therapy, she does not want to proceed, discussed with her other options, she agreed to continue refill by her primary care physician, dosage will be decided by her primary care along with her nephrologist due to her chronic kidney disease, emphasized that high-dose polypharmacy treatment could potentially cause more harmful side effect to her   4 vessel angiogram in March 2002 was normal. 1.  NO ANGIOGRAPHIC EVIDENCE TO SUGGEST OCCLUSIONS, STENOSIS, FILLING DEFECTS, DISSECTIONS,  ARTERIOVENOUS FISTULAE OR ARTERIOVENOUS MALFORMATIONS ARE NOTED.  2.  THERE IS NORMAL OPACIFICATION OF THE DEEP VENOUS SINUSES INTRACRANIALLY.    PHYSICAL EXAM:   Vitals:   04/13/24 1020  BP: 106/68  Pulse: 78  Weight: 221 lb (100.2 kg)  Height: 5' (1.524 m)     Body mass index is 43.16 kg/m.  PHYSICAL EXAMNIATION:  Gen: NAD, conversant, well nourised, well groomed                     Cardiovascular:  Regular rate rhythm, no peripheral edema, warm, nontender. Eyes: Conjunctivae clear without exudates or hemorrhage Neck: Supple, no carotid bruits. Pulmonary: Clear to auscultation bilaterally   NEUROLOGICAL EXAM:  MENTAL STATUS: Speech/cognition: Anxious looking elderly female, awake, alert, oriented to history taking and casual conversation CRANIAL NERVES: CN II: Visual  fields are full to confrontation. Pupils are round equal and briskly reactive to light. CN III, IV, VI: extraocular movement are normal. No ptosis. CN V: Facial sensation is intact to light touch CN VII: Face is symmetric with normal eye closure  CN VIII: Hearing is normal to causal conversation. CN IX, X: Phonation is normal. CN XI: Head turning and shoulder shrug are intact  MOTOR: Normal muscle tone and Barc  REFLEXES: Reflexes are absent and symmetric at the biceps, triceps, knees, and ankles. Plantar responses are flexor.  SENSORY: Mildly length-dependent decreased light touch, pinprick, vibratory sensation to ankle level  COORDINATION: There is no trunk or limb dysmetria noted.  GAIT/STANCE: Push-up to get up from seated position, obesity, unsteady  REVIEW OF SYSTEMS:  Full 14 system review of systems performed and notable only for as above All other review of systems were negative.   ALLERGIES: Allergies  Allergen Reactions   Adhesive [Tape] Hives and Rash   Codeine Nausea Only   Fenofibrate Other (See Comments)    Hair loss   Simvastatin Rash    HOME MEDICATIONS: Current Outpatient Medications  Medication Sig Dispense Refill   dicyclomine  (BENTYL ) 20 MG tablet Take 20 mg by mouth 3 (three) times daily.      Dulaglutide (TRULICITY) 0.75 MG/0.5ML SOPN Inject 0.75 mg into the skin once a week.     furosemide  (LASIX ) 40 MG tablet Take 80 mg by mouth daily. And 40 mg more if needed for swelling     gabapentin  (NEURONTIN ) 400 MG capsule Take 800 mg by mouth in the morning.  2 at supper and 2 at bedtime     levothyroxine (SYNTHROID) 100 MCG tablet Take 100 mcg by mouth daily before breakfast.     metoprolol  tartrate (LOPRESSOR ) 25 MG tablet Take 25 mg by mouth 2 (two) times daily.     rosuvastatin  (CRESTOR ) 5 MG tablet Take 5 mg by mouth 2 (two) times a week.     tiZANidine  (ZANAFLEX ) 2 MG tablet Take 2 mg by mouth 3 (three) times daily.     zolpidem  (AMBIEN ) 5 MG  tablet Take 5 mg by mouth at bedtime as needed for sleep.     No current facility-administered medications for this visit.    PAST MEDICAL HISTORY: Past Medical History:  Diagnosis Date   Ankle syndesmosis disruption, right, sequela 03/13/2017   BBB (bundle branch block)    Cervical spondylosis without myelopathy 08/10/2015   Chronic kidney disease (CKD) 09/08/2018   Chronic kidney disease (CKD), stage III (moderate) (HCC)    Chronic low back pain 02/19/2017   Chronic lower back pain    Closed fracture dislocation of right ankle 01/07/2017   Closed fracture of fourth metatarsal bone 06/05/2020   Diabetic peripheral neuropathy associated with type 2 diabetes mellitus (HCC) 09/08/2018   Dissection of cerebral artery (HCC) 1997   "no OR"   Endometriosis    Family history of adverse reaction to anesthesia    "daughter has PONV"   Fibromyalgia    GERD (gastroesophageal reflux disease)    GERD without esophagitis 09/08/2018   Headache 08/16/2013   Hereditary and idiopathic neuropathy, unspecified 01/21/2022   High cholesterol  hx   History of hiatal hernia    Hypercholesterolemia 11/21/2021   Hyperglycemia due to type 2 diabetes mellitus (HCC) 01/21/2022   Hypertension    Hypertensive heart disease without heart failure 09/08/2018   Mallet fracture, closed 10/27/2020   Mixed hyperlipidemia 09/08/2018   Morbid obesity (HCC) 09/08/2018   Myalgia and myositis 08/16/2013   Obesity    Occipital headache    "qd" (01/10/2017)   Orthostatic hypotension 02/02/2015   Osteoarthritis 09/08/2018   Osteoarthritis of finger of right hand 10/27/2020   Plantar fasciitis    Postablative hypothyroidism 01/21/2022   Preoperative cardiovascular examination 09/11/2018   Seasonal asthma    Shingles    Stomach ulcer    "I take RX for it" (01/10/2017)   Stroke (HCC) 1997; 08/2016   mild right arm weakness   Syncope and collapse 08/16/2013   Syndesmotic disruption of ankle, right, sequela  03/13/2017   Thyrotoxicosis 01/21/2022   Type II diabetes mellitus (HCC)    Vertebral artery dissection (HCC)    right     PAST SURGICAL HISTORY: Past Surgical History:  Procedure Laterality Date   ANAL FISSURE REPAIR     ANKLE ARTHROSCOPY Right 09/17/2018   Procedure: Right ankle arthroscopy with extensive debridement, removal of loose body;  Surgeon: Amada Backer, MD;  Location: [facility];  Service: Orthopedics;  Laterality: Right;   ANKLE CLOSED REDUCTION Right 01/07/2017   Procedure: CLOSED REDUCTION ANKLE;  Surgeon: Adonica Hoose, MD;  Location: [facility];  Service: Orthopedics;  Laterality: Right;   APPENDECTOMY  1966   CATARACT EXTRACTION W/ INTRAOCULAR LENS  IMPLANT, BILATERAL  03/2014 - 04/2014   left - right    CHOLECYSTECTOMY  1994   CLOSED REDUCTION NASAL FRACTURE  1990"s   EXTERNAL FIXATION LEG Right 01/07/2017   Procedure: EXTERNAL FIXATION LEG;  Surgeon: Adonica Hoose, MD;  Location: [facility];  Service: Orthopedics;  Laterality: Right;   FRACTURE SURGERY     HAND SURGERY Bilateral    HARDWARE REMOVAL Right 09/17/2018   Procedure: Right ankle removal of deep implants tibia and fibula;  Surgeon: Amada Backer, MD;  Location: [facility];  Service: Orthopedics;  Laterality: Right;   I & D EXTREMITY Right 10/22/2018   Procedure: Irrigation and debridement and closure of right ankle wound;  Surgeon: Amada Backer, MD;  Location: [facility];  Service: Orthopedics;  Laterality: Right;   JOINT REPLACEMENT     LAPAROSCOPIC CHOLECYSTECTOMY     OPEN REDUCTION INTERNAL FIXATION (ORIF) TIBIA/FIBULA FRACTURE Right 01/20/2017   Procedure: OPEN REDUCTION INTERNAL FIXATION RIGHT ANKLE;  Surgeon: Adonica Hoose, MD;  Location: [facility];  Service: Orthopedics;  Laterality: Right;   radioactive iodine  05/2019   for hyperthyroidism   SALPINGOOPHORECTOMY  1978   SYNDESMOSIS REPAIR Right 03/13/2017   Procedure: HARDWARE REMOVAL OF RIGHT ABKLE, OPEN  REDUCTION INTERNAL FIXATION OF SYNDESMOTIC INJURY;  Surgeon: Adonica Hoose, MD;  Location: [facility];  Service: Orthopedics;  Laterality: Right;  Popliteal block   thyroid  ablation  05/2019   for overactive thyroid    TONSILLECTOMY     TOTAL KNEE ARTHROPLASTY Bilateral    TUBAL LIGATION  1978   VAGINAL HYSTERECTOMY  1979   "partial"    FAMILY HISTORY: Family History  Problem Relation Age of Onset   Alzheimer's disease Mother    CAD Mother    Diabetes Mother    Heart attack Mother    Congestive Heart Failure Father    Diabetes Father  Diabetes Sister    Hypertension Brother    Diabetes Brother    Diabetes Brother     SOCIAL HISTORY: Social History   Socioeconomic History   Marital status: Widowed    Spouse name: Dee Farber   Number of children: 4   Years of education: 13+   Highest education level: Not on file  Occupational History   Occupation: Retired   Tobacco Use   Smoking status: Former    Current packs/day: 0.00    Average packs/day: 0.5 packs/day for 11.0 years (5.5 ttl pk-yrs)    Types: Cigarettes    Start date: 78    Quit date: 1985    Years since quitting: 40.3   Smokeless tobacco: Never  Vaping Use   Vaping status: Never Used  Substance and Sexual Activity   Alcohol  use: No   Drug use: No   Sexual activity: Not Currently  Other Topics Concern   Not on file  Social History Narrative   Patient lives at home with her husband.    Patient is a retired Architectural technologist.    Patient has a high school education and some college.    Patient has four children.   Patient is right handed.   Patient drinks about 2 cups caffeine daily.   Social Drivers of Corporate investment banker Strain: Not on file  Food Insecurity: Not on file  Transportation Needs: Not on file  Physical Activity: Not on file  Stress: Not on file  Social Connections: Not on file  Intimate Partner Violence: Not on file      Phebe Brasil, M.D. Ph.D.  [facility] [address] [place], [place] [address] Ph: [phone] Fax: [phone]  CC:  Harvest Lineman, MD [address] [place],  [place] [address]  Harvest Lineman, MD

## 2024-04-14 DIAGNOSIS — N1832 Chronic kidney disease, stage 3b: Secondary | ICD-10-CM | POA: Diagnosis not present

## 2024-04-14 DIAGNOSIS — E559 Vitamin D deficiency, unspecified: Secondary | ICD-10-CM | POA: Diagnosis not present

## 2024-04-14 DIAGNOSIS — D631 Anemia in chronic kidney disease: Secondary | ICD-10-CM | POA: Diagnosis not present

## 2024-04-14 DIAGNOSIS — N189 Chronic kidney disease, unspecified: Secondary | ICD-10-CM | POA: Diagnosis not present

## 2024-04-14 DIAGNOSIS — I129 Hypertensive chronic kidney disease with stage 1 through stage 4 chronic kidney disease, or unspecified chronic kidney disease: Secondary | ICD-10-CM | POA: Diagnosis not present

## 2024-04-14 DIAGNOSIS — N2581 Secondary hyperparathyroidism of renal origin: Secondary | ICD-10-CM | POA: Diagnosis not present

## 2024-04-30 DIAGNOSIS — E1165 Type 2 diabetes mellitus with hyperglycemia: Secondary | ICD-10-CM | POA: Diagnosis not present

## 2024-04-30 DIAGNOSIS — E7800 Pure hypercholesterolemia, unspecified: Secondary | ICD-10-CM | POA: Diagnosis not present

## 2024-04-30 DIAGNOSIS — E890 Postprocedural hypothyroidism: Secondary | ICD-10-CM | POA: Diagnosis not present

## 2024-05-07 DIAGNOSIS — I10 Essential (primary) hypertension: Secondary | ICD-10-CM | POA: Diagnosis not present

## 2024-05-07 DIAGNOSIS — N189 Chronic kidney disease, unspecified: Secondary | ICD-10-CM | POA: Diagnosis not present

## 2024-05-07 DIAGNOSIS — E890 Postprocedural hypothyroidism: Secondary | ICD-10-CM | POA: Diagnosis not present

## 2024-05-07 DIAGNOSIS — G609 Hereditary and idiopathic neuropathy, unspecified: Secondary | ICD-10-CM | POA: Diagnosis not present

## 2024-05-07 DIAGNOSIS — E1165 Type 2 diabetes mellitus with hyperglycemia: Secondary | ICD-10-CM | POA: Diagnosis not present

## 2024-05-07 DIAGNOSIS — E7800 Pure hypercholesterolemia, unspecified: Secondary | ICD-10-CM | POA: Diagnosis not present

## 2024-05-19 DIAGNOSIS — E785 Hyperlipidemia, unspecified: Secondary | ICD-10-CM | POA: Diagnosis not present

## 2024-05-19 DIAGNOSIS — E1122 Type 2 diabetes mellitus with diabetic chronic kidney disease: Secondary | ICD-10-CM | POA: Diagnosis not present

## 2024-05-19 DIAGNOSIS — N1832 Chronic kidney disease, stage 3b: Secondary | ICD-10-CM | POA: Diagnosis not present

## 2024-05-19 DIAGNOSIS — Z6836 Body mass index (BMI) 36.0-36.9, adult: Secondary | ICD-10-CM | POA: Diagnosis not present

## 2024-06-02 DIAGNOSIS — Z1231 Encounter for screening mammogram for malignant neoplasm of breast: Secondary | ICD-10-CM | POA: Diagnosis not present

## 2024-07-11 NOTE — Progress Notes (Signed)
  Cardiology Office Note   Date:  07/15/2024  ID:  Chloe [name], Chloe [name] [birth year], MRN [id number] PCP: Clemmie Nest, MD  [facility] Providers Cardiologist:  Redell Leiter, MD Cardiology APP:  Carlin Nest BROCKS, NP     History of Present Illness Chloe [name] is a 78 y.o. female with a past medical history of hypertension, RBBB, dissection of cerebral artery, history of stroke 2017, GERD, DM2 managed by Dr. Alyson, hypothyroidism, CKD, dyslipidemia.  03/25/2022 monitor Average heart rate 58 bpm, predominant rhythm is sinus, RBBB was present 08/03/2019 echo EF 60-65%, impaired relaxation, mild dilatation of ascending aorta at 37 mm  She established care with Dr. Leiter in 2019 for preoperative evaluation.  In 2023 she had episode of syncope, monitor was arranged revealing no contributory causes.  Most recently evaluated by Dr. Leiter on 07/18/2023, she was stable from a cardiac perspective, continued loop diuretic for pedal edema, EKG without changes and she was advised she can follow-up in a year.  She presents today for follow-up.  She has been doing well since she was last evaluated in our office, no formal complaints from a cardiac perspective.  She has 3 children who are in healthcare and check on her closely.  She follows closely with an endocrinologist and her most recent A1c was very well-controlled at 5.3%. She denies chest pain, palpitations, dyspnea, pnd, orthopnea, n, v, dizziness, syncope, edema, weight gain, or early satiety.    ROS: Review of Systems  All other systems reviewed and are negative.    Studies Reviewed EKG Interpretation Date/Time:  Tuesday July 13 2024 13:51:50 EDT Ventricular Rate:  56 PR Interval:  166 QRS Duration:  140 QT Interval:  462 QTC Calculation: 445 R Axis:   2  Text Interpretation: Sinus bradycardia Right bundle branch block Cannot rule out Inferior infarct , age undetermined When compared with ECG of 18-Jul-2023 11:26, Criteria for  Anterior infarct are no longer Present No significant change was found Confirmed by Carlin Nest [phone]) on 07/13/2024 1:55:45 PM     Risk Assessment/Calculations        Physical Exam VS:  BP (!) 140/90   Pulse (!) 56   Ht 5' (1.524 m)   Wt 214 lb (97.1 kg)   SpO2 96%   BMI 41.79 kg/m        Wt Readings from Last 3 Encounters:  07/13/24 214 lb (97.1 kg)  04/13/24 221 lb (100.2 kg)  07/18/23 217 lb (98.4 kg)    GEN: Well nourished, well developed in no acute distress NECK: No JVD; No carotid bruits CARDIAC: RRR, no murmurs, rubs, gallops RESPIRATORY:  Clear to auscultation without rales, wheezing or rhonchi  ABDOMEN: Soft, non-tender, non-distended EXTREMITIES:  No edema; No deformity   ASSESSMENT AND PLAN RBBB -present since at least 2020 on her EKG.  Hypertension-blood pressure is elevated today at 140/90, currently on Lopressor  25 mg twice daily, she states is typically well-controlled at home and her children who are in healthcare help with monitoring it.  Dyslipidemia-formally monitored by PCP most recent LDL was 95 which is acceptable as she does not have known coronary artery disease.  Currently only able to tolerate Crestor  5 mg twice a week.  DM2-follows with endocrinologist, most recent A1c was 5.3%.        Dispo: Follow up in 1 year.   Signed, Nest BROCKS Carlin, NP

## 2024-07-13 ENCOUNTER — Ambulatory Visit: Admitting: Cardiology

## 2024-07-13 ENCOUNTER — Encounter: Payer: Self-pay | Admitting: Cardiology

## 2024-07-13 ENCOUNTER — Ambulatory Visit: Attending: Cardiology | Admitting: Cardiology

## 2024-07-13 VITALS — BP 140/90 | HR 56 | Ht 60.0 in | Wt 214.0 lb

## 2024-07-13 DIAGNOSIS — E1165 Type 2 diabetes mellitus with hyperglycemia: Secondary | ICD-10-CM

## 2024-07-13 DIAGNOSIS — Z794 Long term (current) use of insulin: Secondary | ICD-10-CM

## 2024-07-13 DIAGNOSIS — E782 Mixed hyperlipidemia: Secondary | ICD-10-CM | POA: Diagnosis not present

## 2024-07-13 DIAGNOSIS — I1310 Hypertensive heart and chronic kidney disease without heart failure, with stage 1 through stage 4 chronic kidney disease, or unspecified chronic kidney disease: Secondary | ICD-10-CM | POA: Diagnosis not present

## 2024-07-13 DIAGNOSIS — I4510 Unspecified right bundle-branch block: Secondary | ICD-10-CM

## 2024-07-13 NOTE — Patient Instructions (Signed)

## 2024-08-26 DIAGNOSIS — J342 Deviated nasal septum: Secondary | ICD-10-CM | POA: Diagnosis not present

## 2024-08-26 DIAGNOSIS — R0981 Nasal congestion: Secondary | ICD-10-CM | POA: Diagnosis not present

## 2024-08-26 DIAGNOSIS — H9319 Tinnitus, unspecified ear: Secondary | ICD-10-CM | POA: Diagnosis not present

## 2024-08-26 DIAGNOSIS — H6121 Impacted cerumen, right ear: Secondary | ICD-10-CM | POA: Diagnosis not present

## 2024-11-08 DIAGNOSIS — E7800 Pure hypercholesterolemia, unspecified: Secondary | ICD-10-CM | POA: Diagnosis not present

## 2024-11-08 DIAGNOSIS — E1165 Type 2 diabetes mellitus with hyperglycemia: Secondary | ICD-10-CM | POA: Diagnosis not present

## 2024-11-08 DIAGNOSIS — N1832 Chronic kidney disease, stage 3b: Secondary | ICD-10-CM | POA: Diagnosis not present

## 2024-11-08 DIAGNOSIS — E890 Postprocedural hypothyroidism: Secondary | ICD-10-CM | POA: Diagnosis not present

## 2024-11-15 DIAGNOSIS — I10 Essential (primary) hypertension: Secondary | ICD-10-CM | POA: Diagnosis not present

## 2024-11-15 DIAGNOSIS — E7800 Pure hypercholesterolemia, unspecified: Secondary | ICD-10-CM | POA: Diagnosis not present

## 2024-11-15 DIAGNOSIS — N189 Chronic kidney disease, unspecified: Secondary | ICD-10-CM | POA: Diagnosis not present

## 2024-11-15 DIAGNOSIS — G609 Hereditary and idiopathic neuropathy, unspecified: Secondary | ICD-10-CM | POA: Diagnosis not present

## 2024-11-15 DIAGNOSIS — E1165 Type 2 diabetes mellitus with hyperglycemia: Secondary | ICD-10-CM | POA: Diagnosis not present

## 2024-11-15 DIAGNOSIS — E890 Postprocedural hypothyroidism: Secondary | ICD-10-CM | POA: Diagnosis not present

## 2024-11-22 DIAGNOSIS — N2581 Secondary hyperparathyroidism of renal origin: Secondary | ICD-10-CM | POA: Diagnosis not present

## 2024-11-22 DIAGNOSIS — I129 Hypertensive chronic kidney disease with stage 1 through stage 4 chronic kidney disease, or unspecified chronic kidney disease: Secondary | ICD-10-CM | POA: Diagnosis not present

## 2024-11-22 DIAGNOSIS — D509 Iron deficiency anemia, unspecified: Secondary | ICD-10-CM | POA: Diagnosis not present
